# Patient Record
Sex: Female | Born: 1980 | Race: Black or African American | Hispanic: No | Marital: Single | Smoking: Former smoker
Health system: Southern US, Community
[De-identification: ages and names within clinical notes are randomized; demographics above are authoritative.]

## PROBLEM LIST (undated history)

## (undated) DIAGNOSIS — R002 Palpitations: Secondary | ICD-10-CM

## (undated) DIAGNOSIS — F410 Panic disorder [episodic paroxysmal anxiety] without agoraphobia: Secondary | ICD-10-CM

## (undated) DIAGNOSIS — F32A Depression, unspecified: Secondary | ICD-10-CM

## (undated) DIAGNOSIS — T753XXA Motion sickness, initial encounter: Secondary | ICD-10-CM

## (undated) DIAGNOSIS — G43909 Migraine, unspecified, not intractable, without status migrainosus: Secondary | ICD-10-CM

## (undated) DIAGNOSIS — T7840XA Allergy, unspecified, initial encounter: Secondary | ICD-10-CM

## (undated) DIAGNOSIS — G2581 Restless legs syndrome: Secondary | ICD-10-CM

## (undated) DIAGNOSIS — F329 Major depressive disorder, single episode, unspecified: Secondary | ICD-10-CM

## (undated) DIAGNOSIS — M069 Rheumatoid arthritis, unspecified: Secondary | ICD-10-CM

## (undated) DIAGNOSIS — M549 Dorsalgia, unspecified: Secondary | ICD-10-CM

## (undated) HISTORY — DX: Depression, unspecified: F32.A

## (undated) HISTORY — DX: Restless legs syndrome: G25.81

## (undated) HISTORY — PX: TUBAL LIGATION: SHX77

## (undated) HISTORY — DX: Panic disorder (episodic paroxysmal anxiety): F41.0

## (undated) HISTORY — DX: Palpitations: R00.2

## (undated) HISTORY — DX: Major depressive disorder, single episode, unspecified: F32.9

## (undated) HISTORY — PX: ABDOMINAL HYSTERECTOMY: SHX81

## (undated) HISTORY — DX: Allergy, unspecified, initial encounter: T78.40XA

## (undated) HISTORY — DX: Dorsalgia, unspecified: M54.9

## (undated) HISTORY — DX: Migraine, unspecified, not intractable, without status migrainosus: G43.909

---

## 2005-02-05 ENCOUNTER — Ambulatory Visit: Payer: Self-pay

## 2006-06-16 ENCOUNTER — Inpatient Hospital Stay: Payer: Self-pay

## 2006-06-16 ENCOUNTER — Observation Stay: Payer: Self-pay | Admitting: Unknown Physician Specialty

## 2007-06-26 ENCOUNTER — Emergency Department: Payer: Self-pay | Admitting: Emergency Medicine

## 2008-06-07 ENCOUNTER — Emergency Department: Payer: Self-pay

## 2008-08-23 ENCOUNTER — Ambulatory Visit: Payer: Self-pay | Admitting: Family Medicine

## 2008-12-19 ENCOUNTER — Ambulatory Visit: Payer: Self-pay | Admitting: Family Medicine

## 2009-11-08 ENCOUNTER — Emergency Department: Payer: Self-pay | Admitting: Emergency Medicine

## 2010-03-13 ENCOUNTER — Ambulatory Visit: Payer: Self-pay | Admitting: Family Medicine

## 2010-11-01 LAB — HM PAP SMEAR: HM Pap smear: NORMAL

## 2010-12-19 ENCOUNTER — Emergency Department: Payer: Self-pay | Admitting: Emergency Medicine

## 2012-09-16 ENCOUNTER — Ambulatory Visit: Payer: Self-pay | Admitting: Family Medicine

## 2013-08-20 ENCOUNTER — Emergency Department: Payer: Self-pay | Admitting: Emergency Medicine

## 2013-08-20 LAB — CBC
HCT: 40.4 % (ref 35.0–47.0)
HGB: 13.5 g/dL (ref 12.0–16.0)
MCH: 31.6 pg (ref 26.0–34.0)
MCHC: 33.4 g/dL (ref 32.0–36.0)
MCV: 95 fL (ref 80–100)
Platelet: 237 10*3/uL (ref 150–440)
RBC: 4.28 10*6/uL (ref 3.80–5.20)
RDW: 13.5 % (ref 11.5–14.5)
WBC: 11.8 10*3/uL — ABNORMAL HIGH (ref 3.6–11.0)

## 2013-08-20 LAB — URINALYSIS, COMPLETE
Bacteria: NONE SEEN
Bilirubin,UR: NEGATIVE
Glucose,UR: NEGATIVE mg/dL (ref 0–75)
Ketone: NEGATIVE
Nitrite: NEGATIVE
Ph: 6 (ref 4.5–8.0)
Protein: 30
RBC,UR: 1909 /HPF (ref 0–5)
Specific Gravity: 1.005 (ref 1.003–1.030)
Squamous Epithelial: NONE SEEN
WBC UR: 149 /HPF (ref 0–5)

## 2013-08-20 LAB — BASIC METABOLIC PANEL
Anion Gap: 4 — ABNORMAL LOW (ref 7–16)
BUN: 9 mg/dL (ref 7–18)
Calcium, Total: 9.1 mg/dL (ref 8.5–10.1)
Chloride: 108 mmol/L — ABNORMAL HIGH (ref 98–107)
Co2: 28 mmol/L (ref 21–32)
Creatinine: 0.92 mg/dL (ref 0.60–1.30)
EGFR (African American): >60
EGFR (Non-African Amer.): >60
Glucose: 86 mg/dL (ref 65–99)
Osmolality: 277 (ref 275–301)
Potassium: 3.5 mmol/L (ref 3.5–5.1)
Sodium: 140 mmol/L (ref 136–145)

## 2013-09-17 ENCOUNTER — Ambulatory Visit: Payer: Self-pay | Admitting: Orthopedic Surgery

## 2013-10-12 ENCOUNTER — Ambulatory Visit: Payer: Self-pay | Admitting: Family Medicine

## 2015-02-24 ENCOUNTER — Other Ambulatory Visit: Payer: Self-pay

## 2015-02-24 ENCOUNTER — Emergency Department: Payer: Medicaid Other

## 2015-02-24 ENCOUNTER — Encounter: Payer: Self-pay | Admitting: *Deleted

## 2015-02-24 ENCOUNTER — Emergency Department
Admission: EM | Admit: 2015-02-24 | Discharge: 2015-02-24 | Disposition: A | Discharge status: Home / Self Care | Payer: Medicaid Other | Attending: Emergency Medicine | Admitting: Emergency Medicine

## 2015-02-24 DIAGNOSIS — R0789 Other chest pain: Secondary | ICD-10-CM

## 2015-02-24 DIAGNOSIS — R079 Chest pain, unspecified: Secondary | ICD-10-CM | POA: Diagnosis not present

## 2015-02-24 DIAGNOSIS — Z720 Tobacco use: Secondary | ICD-10-CM | POA: Diagnosis not present

## 2015-02-24 DIAGNOSIS — N644 Mastodynia: Secondary | ICD-10-CM | POA: Insufficient documentation

## 2015-02-24 LAB — BASIC METABOLIC PANEL
Anion gap: 6 (ref 5–15)
BUN: 11 mg/dL (ref 6–20)
CO2: 25 mmol/L (ref 22–32)
Calcium: 9 mg/dL (ref 8.9–10.3)
Chloride: 108 mmol/L (ref 101–111)
Creatinine, Ser: 0.75 mg/dL (ref 0.44–1.00)
GFR calc Af Amer: >60 mL/min (ref >60)
GFR calc non Af Amer: >60 mL/min (ref >60)
Glucose, Bld: 100 mg/dL — ABNORMAL HIGH (ref 65–99)
Potassium: 3.8 mmol/L (ref 3.5–5.1)
Sodium: 139 mmol/L (ref 135–145)

## 2015-02-24 LAB — CBC
HCT: 42.5 % (ref 35.0–47.0)
Hemoglobin: 13.8 g/dL (ref 12.0–16.0)
MCH: 30.7 pg (ref 26.0–34.0)
MCHC: 32.4 g/dL (ref 32.0–36.0)
MCV: 94.5 fL (ref 80.0–100.0)
Platelets: 234 10*3/uL (ref 150–440)
RBC: 4.5 MIL/uL (ref 3.80–5.20)
RDW: 13.5 % (ref 11.5–14.5)
WBC: 8 10*3/uL (ref 3.6–11.0)

## 2015-02-24 LAB — TROPONIN I: Troponin I: <0.03 ng/mL (ref <0.031)

## 2015-02-24 NOTE — ED Provider Notes (Signed)
[facility] Emergency Department Provider Note  ____________________________________________  Time seen: Approximately 9:30 AM  I have reviewed the triage vital signs and the nursing notes.   HISTORY  Chief Complaint Chest Pain    HPI Virginia Walls is a 34 y.o. female who presents with several weeks of left-sided chest pain. She reports it is very tender to the touch. She states that it woke her up last night and that she was sweating from the pain. It is reproducible with palpation of her left breast. She denies fever/chills, shortness of breath, nausea, vomiting, abdominal pain. She has had these symptoms for weeks but has not yet had the opportunity to see her primary care doctor. She uses tobacco but has no other significant past medical history. The pain was severe last night but is mild at this time.   No past medical history on file.  There are no active problems to display for this patient.   No past surgical history on file.  No current outpatient prescriptions on file.  Allergies Review of patient's allergies indicates no known allergies.  No family history on file.  Social History History  Substance Use Topics  . Smoking status: Current Every Day Smoker -- 0.50 packs/day    Types: Cigarettes  . Smokeless tobacco: Not on file  . Alcohol Use: Yes    Review of Systems Constitutional: Negative for fever. Eyes: Negative for visual changes. ENT: Negative for sore throat. Cardiovascular: Chest pain that is reproducible with palpation of her left breast. Respiratory: Negative for shortness of breath. Gastrointestinal: Negative for abdominal pain, vomiting and diarrhea. Genitourinary: Negative for dysuria. Musculoskeletal: Negative for back pain. Skin: Negative for rash. Neurological: Negative for headaches, focal weakness or numbness.  10-point ROS otherwise negative.  ____________________________________________   PHYSICAL  EXAM:  VITAL SIGNS: ED Triage Vitals  Enc Vitals Group     BP 02/24/15 0810 114/69 mmHg     Pulse Rate 02/24/15 0810 77     Resp --      Temp 02/24/15 0810 98.4 F (36.9 C)     Temp Source 02/24/15 0810 Oral     SpO2 02/24/15 0810 100 %     Weight 02/24/15 0810 165 lb (74.844 kg)     Height 02/24/15 0810 5\' 3"  (1.6 m)     Head Cir --      Peak Flow --      Pain Score 02/24/15 0811 2     Pain Loc --      Pain Edu? --      Excl. in GC? --     Constitutional: Alert and oriented. Well appearing and in no distress. Eyes: Conjunctivae are normal. PERRL. Normal extraocular movements. Head: Normocephalic and atraumatic. Nose: No congestion/rhinnorhea. Mouth/Throat: Mucous membranes are moist. Neck: No stridor.  No cervical spine tenderness to palpation. Hematological/Lymphatic/Immunilogical: No cervical lymphadenopathy. Cardiovascular: Normal rate, regular rhythm. Normal and symmetric distal pulses are present in all extremities. No murmurs, rubs, or gallops. Highly reproducible left-sided breast tenderness.  There are no signs of infection/cellulitis and no palpable masses or skin lesions. A chaperone was present for the breast exam. Respiratory: Normal respiratory effort without tachypnea nor retractions. Breath sounds are clear and equal bilaterally. No wheezes/rales/rhonchi. Gastrointestinal: Soft and nontender. No distention. No abdominal bruits. There is no CVA tenderness. Musculoskeletal: No lower extremity tenderness nor edema.  Non-tender with normal range of motion in all extremities. No joint effusions. Neurologic:  Normal speech and language. No gross focal  neurologic deficits are appreciated. Speech is normal. No gait instability. Skin:  Skin is warm, dry and intact. No rash noted. Psychiatric: Mood and affect are normal. Speech and behavior are normal. Patient exhibits appropriate insight and judgment.  ____________________________________________   LABS (all labs  ordered are listed, but only abnormal results are displayed)  Labs Reviewed  BASIC METABOLIC PANEL - Abnormal; Notable for the following:    Glucose, Bld 100 (*)    All other components within normal limits  CBC  TROPONIN I   ____________________________________________  EKG   Date: 02/24/2015  Rate: 72  Rhythm: normal sinus rhythm  QRS Axis: normal  Intervals: normal  ST/T Wave abnormalities: normal  Conduction Disutrbances: none  Narrative Interpretation: unremarkable   ____________________________________________  RADIOLOGY  Dg Chest 2 View  02/24/2015   CLINICAL DATA:  One week history of chest pain. Cardiac palpitations for 1 day  EXAM: CHEST  2 VIEW  COMPARISON:  None.  FINDINGS: Lungs are clear. Heart size and pulmonary vascularity are normal. No adenopathy. No pneumothorax. No bone lesions.  IMPRESSION: No edema or consolidation.   Electronically Signed   By: Bretta Bang III M.D.   On: 02/24/2015 08:42    ____________________________________________   PROCEDURES  Procedure(s) performed: None  Critical Care performed: No  ____________________________________________   INITIAL IMPRESSION / ASSESSMENT AND PLAN / ED COURSE  Pertinent labs & imaging results that were available during my care of the patient were reviewed by me and considered in my medical decision making (see chart for details).  The patient is a young, generally healthy woman with highly reviewed reproducible left-sided chest wall tenderness which actually seems to be confined to her left breast.  I am not concerned at this time for ACS nor for a PE (see below).  Vital signs stable/afebrile.  Patient is well-appearing and in no acute distress.  I discussed with her the differential diagnosis and my recommendation to follow-up with her primary care doctor. There is no evidence of a mass, infection, or other emergent breast condition at this time. The patient understands and agrees with the  plan.  HEART score = 1 (smoking and family history)    Pulmonary Embolism Rule-out Criteria (PERC rule)                        If YES to ANY of the following, the PERC rule is not satisfied and cannot be used to rule out PE in this patient (consider d-dimer or imaging depending on pre-test probability).                      If NO to ALL of the following, AND the clinician's pre-test probability is <15%, the [facility] rule is satisfied and there is no need for further workup (including no need to obtain a d-dimer) as the post-test probability of pulmonary embolism is <2%.                      Mnemonic is HAD CLOTS   H - hormone use (exogenous estrogen)      No A - age > 50                                                 No D - DVT/PE history  No   C - coughing blood (hemoptysis)                 No L - leg swelling, unilateral                             No O - O2 Sat on Room Air < 95%                  No T - tachycardia (HR ? 100)                         No S - surgery or trauma, recent                      No   Based on my evaluation of the patient, including application of this decision instrument, further testing to evaluate for pulmonary embolism is not indicated at this time. I have discussed this recommendation with the patient who states understanding and agreement with this plan.  ____________________________________________   FINAL CLINICAL IMPRESSION(S) / ED DIAGNOSES  Final diagnoses:  Breast pain, left  Atypical chest pain     Loleta Rose, MD 02/24/15 1058

## 2015-02-24 NOTE — ED Notes (Signed)
Patient transported to X-ray, ambulatory. No acute distress noted.

## 2015-02-24 NOTE — ED Notes (Signed)
Pt informed to return if any life threatening symptoms occur.  

## 2015-02-24 NOTE — ED Notes (Signed)
Pt reports having chest pain intermittent for the last week, pt reports chest hurts when lying in it, pt reports nausea

## 2015-02-24 NOTE — Discharge Instructions (Signed)
You have been seen in the Emergency Department (ED) today for chest pain.  As we have discussed todays test results are normal, and we believe your pain is due to pain/strain and/or inflammation of the muscles and/or cartilage of your chest wall, though the pain actually seems to be confined to her left breast.  We do not see any signs of inflammation or infection of the breast at this time. We recommend you take ibuprofen 600 mg three times a day with meals for the next 5 days (unless you have been told previously not to take ibuprofen or NSAIDs in general).  You may also take Tylenol according to the label instructions.  Read through the included information for additional treatment recommendations and precautions.  Continue to take your regular medications.   Return to the Emergency Department (ED) if you experience any further chest pain/pressure/tightness, difficulty breathing, or sudden sweating, or other symptoms that concern you.   Breast Tenderness Breast tenderness is a common problem for women of all ages. Breast tenderness may cause mild discomfort to severe pain. It has a variety of causes. Your health care provider will find out the likely cause of your breast tenderness by examining your breasts, asking you about symptoms, and ordering some tests. Breast tenderness usually does not mean you have breast cancer. HOME CARE INSTRUCTIONS  Breast tenderness often can be handled at home. You can try:  Getting fitted for a new bra that provides more support, especially during exercise.  Wearing a more supportive bra or sports bra while sleeping when your breasts are very tender.  If you have a breast injury, apply ice to the area:  Put ice in a plastic bag.  Place a towel between your skin and the bag.  Leave the ice on for 20 minutes, 2-3 times a day.  If your breasts are too full of milk as a result of breastfeeding, try:  Expressing milk either by hand or with a breast  pump.  Applying a warm compress to the breasts for relief.  Taking over-the-counter pain relievers, if approved by your health care provider.  Taking other medicines that your health care provider prescribes. These may include antibiotic medicines or birth control pills. Over the long term, your breast tenderness might be eased if you:  Cut down on caffeine.  Reduce the amount of fat in your diet. Keep a log of the days and times when your breasts are most tender. This will help you and your health care provider find the cause of the tenderness and how to relieve it. Also, learn how to do breast exams at home. This will help you notice if you have an unusual growth or lump that could cause tenderness. SEEK MEDICAL CARE IF:   Any part of your breast is hard, red, and hot to the touch. This could be a sign of infection.  Fluid is coming out of your nipples (and you are not breastfeeding). Especially watch for blood or pus.  You have a fever as well as breast tenderness.  You have a new or painful lump in your breast that remains after your menstrual period ends.  You have tried to take care of the pain at home, but it has not gone away.  Your breast pain is getting worse, or the pain is making it hard to do the things you usually do during your day. Document Released: 09/19/2008 Document Revised: 06/09/2013 Document Reviewed: 05/06/2013 [facility] Patient Information 2015 [place], [place]. This information is  not intended to replace advice given to you by your health care provider. Make sure you discuss any questions you have with your health care provider.  Chest Pain (Nonspecific) It is often hard to give a specific diagnosis for the cause of chest pain. There is always a chance that your pain could be related to something serious, such as a heart attack or a blood clot in the lungs. You need to follow up with your health care provider for further evaluation. CAUSES    Heartburn.  Pneumonia or bronchitis.  Anxiety or stress.  Inflammation around your heart (pericarditis) or lung (pleuritis or pleurisy).  A blood clot in the lung.  A collapsed lung (pneumothorax). It can develop suddenly on its own (spontaneous pneumothorax) or from trauma to the chest.  Shingles infection (herpes zoster virus). The chest wall is composed of bones, muscles, and cartilage. Any of these can be the source of the pain.  The bones can be bruised by injury.  The muscles or cartilage can be strained by coughing or overwork.  The cartilage can be affected by inflammation and become sore (costochondritis). DIAGNOSIS  Lab tests or other studies may be needed to find the cause of your pain. Your health care provider may have you take a test called an ambulatory electrocardiogram (ECG). An ECG records your heartbeat patterns over a 24-hour period. You may also have other tests, such as:  Transthoracic echocardiogram (TTE). During echocardiography, sound waves are used to evaluate how blood flows through your heart.  Transesophageal echocardiogram (TEE).  Cardiac monitoring. This allows your health care provider to monitor your heart rate and rhythm in real time.  Holter monitor. This is a portable device that records your heartbeat and can help diagnose heart arrhythmias. It allows your health care provider to track your heart activity for several days, if needed.  Stress tests by exercise or by giving medicine that makes the heart beat faster. TREATMENT   Treatment depends on what may be causing your chest pain. Treatment may include:  Acid blockers for heartburn.  Anti-inflammatory medicine.  Pain medicine for inflammatory conditions.  Antibiotics if an infection is present.  You may be advised to change lifestyle habits. This includes stopping smoking and avoiding alcohol, caffeine, and chocolate.  You may be advised to keep your head raised (elevated) when  sleeping. This reduces the chance of acid going backward from your stomach into your esophagus. Most of the time, nonspecific chest pain will improve within 2-3 days with rest and mild pain medicine.  HOME CARE INSTRUCTIONS   If antibiotics were prescribed, take them as directed. Finish them even if you start to feel better.  For the next few days, avoid physical activities that bring on chest pain. Continue physical activities as directed.  Do not use any tobacco products, including cigarettes, chewing tobacco, or electronic cigarettes.  Avoid drinking alcohol.  Only take medicine as directed by your health care provider.  Follow your health care provider's suggestions for further testing if your chest pain does not go away.  Keep any follow-up appointments you made. If you do not go to an appointment, you could develop lasting (chronic) problems with pain. If there is any problem keeping an appointment, call to reschedule. SEEK MEDICAL CARE IF:   Your chest pain does not go away, even after treatment.  You have a rash with blisters on your chest.  You have a fever. SEEK IMMEDIATE MEDICAL CARE IF:   You have increased chest  pain or pain that spreads to your arm, neck, jaw, back, or abdomen.  You have shortness of breath.  You have an increasing cough, or you cough up blood.  You have severe back or abdominal pain.  You feel nauseous or vomit.  You have severe weakness.  You faint.  You have chills. This is an emergency. Do not wait to see if the pain will go away. Get medical help at once. Call your local emergency services (911 in [place] Do not drive yourself to the hospital. MAKE SURE YOU:   Understand these instructions.  Will watch your condition.  Will get help right away if you are not doing well or get worse. Document Released: 07/17/2005 Document Revised: 10/12/2013 Document Reviewed: 05/12/2008 [facility] Patient Information 2015 [place], [place]. This  information is not intended to replace advice given to you by your health care provider. Make sure you discuss any questions you have with your health care provider.

## 2015-02-24 NOTE — ED Notes (Signed)
MD at bedside. Dr. York Cerise

## 2015-02-24 NOTE — ED Notes (Signed)
Duplicate order of EKG discontinued.

## 2015-04-20 ENCOUNTER — Encounter: Payer: Self-pay | Admitting: Emergency Medicine

## 2015-04-20 ENCOUNTER — Emergency Department
Admission: EM | Admit: 2015-04-20 | Discharge: 2015-04-20 | Disposition: A | Discharge status: Home / Self Care | Payer: Medicaid Other | Attending: Emergency Medicine | Admitting: Emergency Medicine

## 2015-04-20 DIAGNOSIS — M545 Low back pain, unspecified: Secondary | ICD-10-CM

## 2015-04-20 DIAGNOSIS — Z3202 Encounter for pregnancy test, result negative: Secondary | ICD-10-CM | POA: Insufficient documentation

## 2015-04-20 DIAGNOSIS — Z791 Long term (current) use of non-steroidal anti-inflammatories (NSAID): Secondary | ICD-10-CM | POA: Insufficient documentation

## 2015-04-20 DIAGNOSIS — R109 Unspecified abdominal pain: Secondary | ICD-10-CM | POA: Diagnosis present

## 2015-04-20 DIAGNOSIS — Z720 Tobacco use: Secondary | ICD-10-CM | POA: Insufficient documentation

## 2015-04-20 LAB — COMPREHENSIVE METABOLIC PANEL
ALT: 14 U/L (ref 14–54)
AST: 18 U/L (ref 15–41)
Albumin: 4.1 g/dL (ref 3.5–5.0)
Alkaline Phosphatase: 47 U/L (ref 38–126)
Anion gap: 8 (ref 5–15)
BUN: 9 mg/dL (ref 6–20)
CO2: 27 mmol/L (ref 22–32)
Calcium: 9.3 mg/dL (ref 8.9–10.3)
Chloride: 103 mmol/L (ref 101–111)
Creatinine, Ser: 0.72 mg/dL (ref 0.44–1.00)
GFR calc Af Amer: >60 mL/min (ref >60)
GFR calc non Af Amer: >60 mL/min (ref >60)
Glucose, Bld: 96 mg/dL (ref 65–99)
Potassium: 4.4 mmol/L (ref 3.5–5.1)
Sodium: 138 mmol/L (ref 135–145)
Total Bilirubin: 0.4 mg/dL (ref 0.3–1.2)
Total Protein: 8.3 g/dL — ABNORMAL HIGH (ref 6.5–8.1)

## 2015-04-20 LAB — URINALYSIS COMPLETE WITH MICROSCOPIC (ARMC ONLY)
Bacteria, UA: NONE SEEN
Bilirubin Urine: NEGATIVE
Glucose, UA: NEGATIVE mg/dL
Hgb urine dipstick: NEGATIVE
Ketones, ur: NEGATIVE mg/dL
Leukocytes, UA: NEGATIVE
Nitrite: NEGATIVE
Protein, ur: NEGATIVE mg/dL
Specific Gravity, Urine: 1.024 (ref 1.005–1.030)
pH: 5 (ref 5.0–8.0)

## 2015-04-20 LAB — LIPASE, BLOOD: Lipase: 43 U/L (ref 22–51)

## 2015-04-20 LAB — CBC
HCT: 40.5 % (ref 35.0–47.0)
Hemoglobin: 13.2 g/dL (ref 12.0–16.0)
MCH: 30.8 pg (ref 26.0–34.0)
MCHC: 32.6 g/dL (ref 32.0–36.0)
MCV: 94.4 fL (ref 80.0–100.0)
Platelets: 220 10*3/uL (ref 150–440)
RBC: 4.29 MIL/uL (ref 3.80–5.20)
RDW: 13.3 % (ref 11.5–14.5)
WBC: 8.1 10*3/uL (ref 3.6–11.0)

## 2015-04-20 LAB — POCT PREGNANCY, URINE: Preg Test, Ur: NEGATIVE

## 2015-04-20 MED ORDER — KETOROLAC TROMETHAMINE 30 MG/ML IJ SOLN
30.0000 mg | Freq: Once | INTRAMUSCULAR | Status: AC
  Administered 2015-04-20: 30 mg via INTRAVENOUS

## 2015-04-20 MED ORDER — SODIUM CHLORIDE 0.9 % IV SOLN
1000.0000 mL | Freq: Once | INTRAVENOUS | Status: AC
  Administered 2015-04-20: 1000 mL via INTRAVENOUS

## 2015-04-20 MED ORDER — NAPROXEN 500 MG PO TABS: 500.0000 mg | ORAL_TABLET | Freq: Two times a day (BID) | ORAL | Status: DC

## 2015-04-20 MED ORDER — KETOROLAC TROMETHAMINE 30 MG/ML IJ SOLN
INTRAMUSCULAR | Status: AC
  Administered 2015-04-20: 30 mg via INTRAVENOUS
  Filled 2015-04-20: qty 1

## 2015-04-20 NOTE — ED Provider Notes (Signed)
[facility] Emergency Department Provider Note  ____________________________________________  Time seen: On arrival  I have reviewed the triage vital signs and the nursing notes.   HISTORY  Chief Complaint Flank Pain   HPI Virginia Walls is a 34 y.o. female who presents with complaints of right flank painthat she has had for approximately 4 days. She reports the pain is constant, moderate and aching in nature and worse with movement. She has no history of kidney stones. She denies dysuria. She denies hematuria. She has no fevers no chills. She has no history of this in the past. No injury noted.     History reviewed. No pertinent past medical history.  There are no active problems to display for this patient.   Past Surgical History  Procedure Laterality Date  . Abdominal hysterectomy      Current Outpatient Rx  Name  Route  Sig  Dispense  Refill  . meloxicam (MOBIC) 15 MG tablet   Oral   Take 1 tablet by mouth daily.      0     Allergies Review of patient's allergies indicates no known allergies.  History reviewed. No pertinent family history.  Social History History  Substance Use Topics  . Smoking status: Current Every Day Smoker -- 0.50 packs/day    Types: Cigarettes  . Smokeless tobacco: Not on file  . Alcohol Use: Yes    Review of Systems  Constitutional: Negative for fever. Eyes: Negative for visual changes. ENT: Negative for sore throat Cardiovascular: Negative for chest pain. Respiratory: Negative for shortness of breath. Gastrointestinal: Negative for vomiting and diarrhea. Genitourinary: Negative for dysuria. Musculoskeletal: Positive for back pain on the right Skin: Negative for rash. Neurological: Negative for headaches or focal weakness Psychiatric: No anxiety  10-point ROS otherwise negative.  ____________________________________________   PHYSICAL EXAM:  VITAL SIGNS: ED Triage Vitals  Enc Vitals Group      BP 04/20/15 0826 113/66 mmHg     Pulse Rate 04/20/15 0826 97     Resp --      Temp 04/20/15 0826 98.1 F (36.7 C)     Temp Source 04/20/15 0826 Oral     SpO2 04/20/15 0826 100 %     Weight 04/20/15 0826 165 lb (74.844 kg)     Height 04/20/15 0826 5\' 4"  (1.626 m)     Head Cir --      Peak Flow --      Pain Score 04/20/15 0826 10     Pain Loc --      Pain Edu? --      Excl. in GC? --      Constitutional: Alert and oriented. Well appearing and in no distress. Eyes: Conjunctivae are normal.  ENT   Head: Normocephalic and atraumatic.   Mouth/Throat: Mucous membranes are moist. Cardiovascular: Normal rate, regular rhythm. Normal and symmetric distal pulses are present in all extremities. No murmurs, rubs, or gallops. Respiratory: Normal respiratory effort without tachypnea nor retractions. Breath sounds are clear and equal bilaterally.  Gastrointestinal: Soft and non-tender in all quadrants. No distention. There is no CVA tenderness. Genitourinary: deferred Musculoskeletal: Nontender with normal range of motion in all extremities. No lower extremity tenderness nor edema. Neurologic:  Normal speech and language. No gross focal neurologic deficits are appreciated. Skin:  Skin is warm, dry and intact. No rash noted. Psychiatric: Mood and affect are normal. Patient exhibits appropriate insight and judgment.  ____________________________________________    LABS (pertinent positives/negatives)  Labs Reviewed  COMPREHENSIVE METABOLIC PANEL - Abnormal; Notable for the following:    Total Protein 8.3 (*)    All other components within normal limits  URINALYSIS COMPLETEWITH MICROSCOPIC ([facility] ONLY) - Abnormal; Notable for the following:    Color, Urine YELLOW (*)    APPearance CLEAR (*)    Squamous Epithelial / LPF 0-5 (*)    All other components within normal limits  CBC  LIPASE, BLOOD  POCT PREGNANCY, URINE     ____________________________________________   EKG  None  ____________________________________________    RADIOLOGY I have personally reviewed any xrays that were ordered on this patient:  None ordered  ____________________________________________   PROCEDURES  Procedure(s) performed: none  Critical Care performed: none  ____________________________________________   INITIAL IMPRESSION / ASSESSMENT AND PLAN / ED COURSE  Pertinent labs & imaging results that were available during my care of the patient were reviewed by me and considered in my medical decision making (see chart for details).  Overall patient well-appearing, differential diagnosis includes kidney stone, pyonephritis, urinary tract infection, muscular skeletal injury-strain.  ----------------------------------------- 10:53 AM on 04/20/2015 -----------------------------------------  Urinalysis negative for infection also negative for blood. Labs otherwise benign. Given that patient has symptoms when she moves I feel this is likely musculoskeletal and she agrees. We will treat this with NSAIDs and have patient follow-up with her primary care provider. She knows to return if she developed any fevers chills nausea vomiting or any other concerning symptoms  ____________________________________________   FINAL CLINICAL IMPRESSION(S) / ED DIAGNOSES  Final diagnoses:  Right-sided low back pain without sciatica     Jene Every, MD 04/20/15 1056

## 2015-04-20 NOTE — Discharge Instructions (Signed)
Back Pain, Adult °Back pain is very common. The pain often gets better over time. The cause of back pain is usually not dangerous. Most people can learn to manage their back pain on their own.  °HOME CARE  °· Stay active. Start with short walks on flat ground if you can. Try to walk farther each day. °· Do not sit, drive, or stand in one place for more than 30 minutes. Do not stay in bed. °· Do not avoid exercise or work. Activity can help your back heal faster. °· Be careful when you bend or lift an object. Bend at your knees, keep the object close to you, and do not twist. °· Sleep on a firm mattress. Lie on your side, and bend your knees. If you lie on your back, put a pillow under your knees. °· Only take medicines as told by your doctor. °· Put ice on the injured area. °¨ Put ice in a plastic bag. °¨ Place a towel between your skin and the bag. °¨ Leave the ice on for 15-20 minutes, 03-04 times a day for the first 2 to 3 days. After that, you can switch between ice and heat packs. °· Ask your doctor about back exercises or massage. °· Avoid feeling anxious or stressed. Find good ways to deal with stress, such as exercise. °GET HELP RIGHT AWAY IF:  °· Your pain does not go away with rest or medicine. °· Your pain does not go away in 1 week. °· You have new problems. °· You do not feel well. °· The pain spreads into your legs. °· You cannot control when you poop (bowel movement) or pee (urinate). °· Your arms or legs feel weak or lose feeling (numbness). °· You feel sick to your stomach (nauseous) or throw up (vomit). °· You have belly (abdominal) pain. °· You feel like you may pass out (faint). °MAKE SURE YOU:  °· Understand these instructions. °· Will watch your condition. °· Will get help right away if you are not doing well or get worse. °Document Released: 03/25/2008 Document Revised: 12/30/2011 Document Reviewed: 02/08/2014 °[employer]® Patient Information ©2015 [employer]. This information is not intended  to replace advice given to you by your health care provider. Make sure you discuss any questions you have with your health care provider. ° °

## 2015-04-20 NOTE — ED Notes (Signed)
Pt reports that she developed right sided flank pain for the last few days. States it is getting worse. Denies any blood in urine or painful urination. Denies any know injury. States that she can not get comfortable.

## 2015-04-29 ENCOUNTER — Encounter: Payer: Self-pay | Admitting: Family Medicine

## 2015-04-29 DIAGNOSIS — G8929 Other chronic pain: Secondary | ICD-10-CM | POA: Insufficient documentation

## 2015-04-29 DIAGNOSIS — F32A Depression, unspecified: Secondary | ICD-10-CM | POA: Insufficient documentation

## 2015-04-29 DIAGNOSIS — G43909 Migraine, unspecified, not intractable, without status migrainosus: Secondary | ICD-10-CM | POA: Insufficient documentation

## 2015-04-29 DIAGNOSIS — R0683 Snoring: Secondary | ICD-10-CM | POA: Insufficient documentation

## 2015-04-29 DIAGNOSIS — E559 Vitamin D deficiency, unspecified: Secondary | ICD-10-CM | POA: Insufficient documentation

## 2015-04-29 DIAGNOSIS — F329 Major depressive disorder, single episode, unspecified: Secondary | ICD-10-CM | POA: Insufficient documentation

## 2015-04-29 DIAGNOSIS — F410 Panic disorder [episodic paroxysmal anxiety] without agoraphobia: Secondary | ICD-10-CM | POA: Insufficient documentation

## 2015-04-29 DIAGNOSIS — G2581 Restless legs syndrome: Secondary | ICD-10-CM | POA: Insufficient documentation

## 2015-04-29 DIAGNOSIS — R42 Dizziness and giddiness: Secondary | ICD-10-CM | POA: Insufficient documentation

## 2015-04-29 DIAGNOSIS — M549 Dorsalgia, unspecified: Secondary | ICD-10-CM

## 2015-04-29 DIAGNOSIS — E663 Overweight: Secondary | ICD-10-CM | POA: Insufficient documentation

## 2015-04-29 DIAGNOSIS — F17200 Nicotine dependence, unspecified, uncomplicated: Secondary | ICD-10-CM | POA: Insufficient documentation

## 2015-04-29 DIAGNOSIS — R102 Pelvic and perineal pain: Secondary | ICD-10-CM | POA: Insufficient documentation

## 2015-05-02 ENCOUNTER — Encounter: Payer: Self-pay | Admitting: Family Medicine

## 2015-05-04 ENCOUNTER — Ambulatory Visit (INDEPENDENT_AMBULATORY_CARE_PROVIDER_SITE_OTHER): Payer: Medicaid Other | Admitting: Family Medicine

## 2015-05-04 ENCOUNTER — Encounter: Payer: Self-pay | Admitting: Family Medicine

## 2015-05-04 VITALS — BP 118/64 | HR 104 | Temp 98.4°F | Resp 18 | Ht 64.0 in | Wt 166.4 lb

## 2015-05-04 DIAGNOSIS — Z0000 Encounter for general adult medical examination without abnormal findings: Secondary | ICD-10-CM

## 2015-05-04 DIAGNOSIS — Z7189 Other specified counseling: Secondary | ICD-10-CM

## 2015-05-04 DIAGNOSIS — L818 Other specified disorders of pigmentation: Secondary | ICD-10-CM | POA: Diagnosis not present

## 2015-05-04 DIAGNOSIS — R739 Hyperglycemia, unspecified: Secondary | ICD-10-CM | POA: Diagnosis not present

## 2015-05-04 DIAGNOSIS — E559 Vitamin D deficiency, unspecified: Secondary | ICD-10-CM

## 2015-05-04 DIAGNOSIS — Z716 Tobacco abuse counseling: Secondary | ICD-10-CM

## 2015-05-04 DIAGNOSIS — M545 Low back pain, unspecified: Secondary | ICD-10-CM

## 2015-05-04 DIAGNOSIS — Z01419 Encounter for gynecological examination (general) (routine) without abnormal findings: Secondary | ICD-10-CM

## 2015-05-04 DIAGNOSIS — Z13220 Encounter for screening for lipoid disorders: Secondary | ICD-10-CM | POA: Diagnosis not present

## 2015-05-04 DIAGNOSIS — Z719 Counseling, unspecified: Secondary | ICD-10-CM

## 2015-05-04 MED ORDER — CYCLOBENZAPRINE HCL 10 MG PO TABS: 10.0000 mg | ORAL_TABLET | Freq: Three times a day (TID) | ORAL | Status: DC | PRN

## 2015-05-04 NOTE — Progress Notes (Signed)
Name: Virginia Walls   MRN: [id number]    DOB: July 29, 1981   Date:05/04/2015       Progress Note  Subjective  Chief Complaint  Chief Complaint  Patient presents with  . Annual Exam    HPI  Well Woman Exam: she is feeling well except for low back pain, it was severe two weeks ago and had to go to [facility], was given Naproxen and the pain has improved, but not gone. She denies radiculitis.   She is status post-hysterectomy for vaginal bleeding in 2012 no longer needs hysterectomy  She goes to the dentist every 6 months.   She is in a monogamous relationship past 14 years and does not want to be checked for STI  Patient Active Problem List   Diagnosis Date Noted  . Hyperglycemia 05/04/2015  . Back pain, chronic 04/29/2015  . Clinical depression 04/29/2015  . Headache, migraine 04/29/2015  . Excess weight 04/29/2015  . Panic disorder 04/29/2015  . Pelvic pain in female 04/29/2015  . Restless leg 04/29/2015  . Snores 04/29/2015  . Compulsive tobacco user syndrome 04/29/2015  . Vitamin D deficiency 04/29/2015  . Vertigo 04/29/2015    Past Surgical History  Procedure Laterality Date  . Abdominal hysterectomy    . Tubal ligation      Family History  Problem Relation Age of Onset  . Heart disease Mother   . Asthma Mother   . Hypothyroidism Mother   . Hypothyroidism Sister   . Diabetes Sister   . Hyperlipidemia Sister   . Asthma Son   . Heart disease Paternal Grandmother     Pacemaker  . Leukemia Paternal Grandmother   . Diabetes Paternal Grandmother     History   Social History  . Marital Status: Single    Spouse Name: N/A  . Number of Children: N/A  . Years of Education: N/A   Occupational History  . Not on file.   Social History Main Topics  . Smoking status: Current Some Day Smoker -- 0.25 packs/day for 4 years    Types: Cigarettes    Start date: 05/04/2011  . Smokeless tobacco: Never Used  . Alcohol Use: 0.0 oz/week    0 Standard drinks or equivalent per  week     Comment: occasionally  . Drug Use: No  . Sexual Activity:    Partners: Male    Birth Control/ Protection:    Other Topics Concern  . Not on file   Social History Narrative     Current outpatient prescriptions:  .  cyclobenzaprine (FLEXERIL) 10 MG tablet, Take 1 tablet (10 mg total) by mouth 3 (three) times daily as needed for muscle spasms., Disp: 30 tablet, Rfl: 0 .  naproxen (NAPROSYN) 500 MG tablet, Take 1 tablet (500 mg total) by mouth 2 (two) times daily with a meal., Disp: 20 tablet, Rfl: 2  No Known Allergies   ROS  Constitutional: Negative for fever or weight change.  Respiratory: Negative for cough and shortness of breath.   Cardiovascular: Negative for chest pain or palpitations.  Gastrointestinal: Negative for abdominal pain, no bowel changes.  Musculoskeletal: Negative for gait problem or joint swelling. Positive for back pain Skin: Negative for rash.  Neurological: Negative for dizziness or headache.  No other specific complaints in a complete review of systems (except as listed in HPI above).  Objective  Filed Vitals:   05/04/15 0838  BP: 118/64  Pulse: 104  Temp: 98.4 F (36.9 C)  TempSrc: Oral  Resp: 18  Height: '5\' 4"'  (1.626 m)  Weight: 166 lb 6.4 oz (75.479 kg)  SpO2: 98%    Body mass index is 28.55 kg/(m^2).  Physical Exam  Constitutional: Patient appears well-developed and well-nourished. No distress.  HENT: Head: Normocephalic and atraumatic. Ears: B TMs ok, no erythema or effusion; Nose: Nose normal. Mouth/Throat: Oropharynx is clear and moist. No oropharyngeal exudate.  Eyes: Conjunctivae and EOM are normal. Pupils are equal, round, and reactive to light. No scleral icterus.  Neck: Normal range of motion. Neck supple. No JVD present. No thyromegaly present.  Cardiovascular: Normal rate, regular rhythm and normal heart sounds.  No murmur heard. No BLE edema. Pulmonary/Chest: Effort normal and breath sounds normal. No respiratory  distress. Abdominal: Soft. Bowel sounds are normal, no distension. There is no tenderness. no masses Breast: no lumps or masses, no nipple discharge or rashes FEMALE GENITALIA:  External genitalia normal External urethra normal Vaginal vault normal without discharge or lesions RECTAL: not done Musculoskeletal: Normal range of motion, no joint effusions. No gross deformities. Pain during palpation of left lower back, neg straight leg raise Neurological: he is alert and oriented to person, place, and time. No cranial nerve deficit. Coordination, balance, strength, speech and gait are normal.  Skin: Skin is warm and dry. No rash noted. No erythema. Tattoos  Psychiatric: Patient has a normal mood and affect. behavior is normal. Judgment and thought content normal.  Recent Results (from the past 2160 hour(s))  CBC     Status: None   Collection Time: 02/24/15  8:18 AM  Result Value Ref Range   WBC 8.0 3.6 - 11.0 K/uL   RBC 4.50 3.80 - 5.20 MIL/uL   Hemoglobin 13.8 12.0 - 16.0 g/dL   HCT 42.5 35.0 - 47.0 %   MCV 94.5 80.0 - 100.0 fL   MCH 30.7 26.0 - 34.0 pg   MCHC 32.4 32.0 - 36.0 g/dL   RDW 13.5 11.5 - 14.5 %   Platelets 234 150 - 440 K/uL  Basic metabolic panel     Status: Abnormal   Collection Time: 02/24/15  8:18 AM  Result Value Ref Range   Sodium 139 135 - 145 mmol/L   Potassium 3.8 3.5 - 5.1 mmol/L   Chloride 108 101 - 111 mmol/L   CO2 25 22 - 32 mmol/L   Glucose, Bld 100 (H) 65 - 99 mg/dL   BUN 11 6 - 20 mg/dL   Creatinine, Ser 0.75 0.44 - 1.00 mg/dL   Calcium 9.0 8.9 - 10.3 mg/dL   GFR calc non Af Amer >60 >60 mL/min   GFR calc Af Amer >60 >60 mL/min    Comment: (NOTE) The eGFR has been calculated using the CKD EPI equation. This calculation has not been validated in all clinical situations. eGFR's persistently <60 mL/min signify possible Chronic Kidney Disease.    Anion gap 6 5 - 15  Troponin I     Status: None   Collection Time: 02/24/15  8:18 AM  Result Value Ref  Range   Troponin I <0.03 <0.031 ng/mL    Comment:        NO INDICATION OF MYOCARDIAL INJURY.   CBC     Status: None   Collection Time: 04/20/15  8:47 AM  Result Value Ref Range   WBC 8.1 3.6 - 11.0 K/uL   RBC 4.29 3.80 - 5.20 MIL/uL   Hemoglobin 13.2 12.0 - 16.0 g/dL   HCT 40.5 35.0 - 47.0 %   MCV  94.4 80.0 - 100.0 fL   MCH 30.8 26.0 - 34.0 pg   MCHC 32.6 32.0 - 36.0 g/dL   RDW 13.3 11.5 - 14.5 %   Platelets 220 150 - 440 K/uL  Comprehensive metabolic panel     Status: Abnormal   Collection Time: 04/20/15  8:47 AM  Result Value Ref Range   Sodium 138 135 - 145 mmol/L   Potassium 4.4 3.5 - 5.1 mmol/L   Chloride 103 101 - 111 mmol/L   CO2 27 22 - 32 mmol/L   Glucose, Bld 96 65 - 99 mg/dL   BUN 9 6 - 20 mg/dL   Creatinine, Ser 0.72 0.44 - 1.00 mg/dL   Calcium 9.3 8.9 - 10.3 mg/dL   Total Protein 8.3 (H) 6.5 - 8.1 g/dL   Albumin 4.1 3.5 - 5.0 g/dL   AST 18 15 - 41 U/L   ALT 14 14 - 54 U/L   Alkaline Phosphatase 47 38 - 126 U/L   Total Bilirubin 0.4 0.3 - 1.2 mg/dL   GFR calc non Af Amer >60 >60 mL/min   GFR calc Af Amer >60 >60 mL/min    Comment: (NOTE) The eGFR has been calculated using the CKD EPI equation. This calculation has not been validated in all clinical situations. eGFR's persistently <60 mL/min signify possible Chronic Kidney Disease.    Anion gap 8 5 - 15  Lipase, blood     Status: None   Collection Time: 04/20/15  8:47 AM  Result Value Ref Range   Lipase 43 22 - 51 U/L  Urinalysis complete, with microscopic ([facility] only)     Status: Abnormal   Collection Time: 04/20/15  8:49 AM  Result Value Ref Range   Color, Urine YELLOW (A) YELLOW   APPearance CLEAR (A) CLEAR   Glucose, UA NEGATIVE NEGATIVE mg/dL   Bilirubin Urine NEGATIVE NEGATIVE   Ketones, ur NEGATIVE NEGATIVE mg/dL   Specific Gravity, Urine 1.024 1.005 - 1.030   Hgb urine dipstick NEGATIVE NEGATIVE   pH 5.0 5.0 - 8.0   Protein, ur NEGATIVE NEGATIVE mg/dL   Nitrite NEGATIVE NEGATIVE    Leukocytes, UA NEGATIVE NEGATIVE   RBC / HPF 0-5 0 - 5 RBC/hpf   WBC, UA 0-5 0 - 5 WBC/hpf   Bacteria, UA NONE SEEN NONE SEEN   Squamous Epithelial / LPF 0-5 (A) NONE SEEN   Mucous PRESENT   Pregnancy, urine POC     Status: None   Collection Time: 04/20/15  9:25 AM  Result Value Ref Range   Preg Test, Ur NEGATIVE NEGATIVE    Comment:        THE SENSITIVITY OF THIS METHODOLOGY IS >24 mIU/mL      PHQ2/9: Depression screen PHQ 2/9 05/04/2015  Decreased Interest 1  Down, Depressed, Hopeless 1  PHQ - 2 Score 2  Altered sleeping 2  Tired, decreased energy 1  Change in appetite 2  Feeling bad or failure about yourself  1  Trouble concentrating 1  Moving slowly or fidgety/restless 0  Suicidal thoughts 0  PHQ-9 Score 9  Difficult doing work/chores Not difficult at all   She states she is grieving at this time, does not like medication for depression  Fall Risk: Fall Risk  05/04/2015  Falls in the past year? No      Assessment & Plan  1. Well woman exam  2. Midline low back pain without sciatica  - cyclobenzaprine (FLEXERIL) 10 MG tablet; Take 1 tablet (10 mg  total) by mouth 3 (three) times daily as needed for muscle spasms.  Dispense: 30 tablet; Refill: 0  3. Vitamin D deficiency  - Vitamin D (25 hydroxy)  4. Hyperglycemia  - HgB A1c  5. Encounter for tobacco use cessation counseling  She is down to one cigarette daily , encouraged her to keep up the good work  6. Health counseling  Discussed importance of 150 minutes of physical activity weekly, eat two servings of fish weekly, eat one serving of tree nuts ( cashews, pistachios, pecans, almonds.Marland Kitchen) every other day, eat 6 servings of fruit/vegetables daily and drink plenty of water and avoid sweet beverages.   7. Lipid screening  - Lipid Profile

## 2015-05-04 NOTE — Patient Instructions (Signed)
Discussed importance of 150 minutes of physical activity weekly, eat two servings of fish weekly, eat one serving of tree nuts ( cashews, pistachios, pecans, almonds..) every other day, eat 6 servings of fruit/vegetables daily and drink plenty of water and avoid sweet beverages. 

## 2015-05-05 ENCOUNTER — Other Ambulatory Visit: Payer: Self-pay | Admitting: Family Medicine

## 2015-05-05 LAB — HEMOGLOBIN A1C
Est. average glucose Bld gHb Est-mCnc: 117 mg/dL
Hgb A1c MFr Bld: 5.7 % — ABNORMAL HIGH (ref 4.8–5.6)

## 2015-05-05 LAB — LIPID PANEL
Chol/HDL Ratio: 3.9 ratio units (ref 0.0–4.4)
Cholesterol, Total: 220 mg/dL — ABNORMAL HIGH (ref 100–199)
HDL: 56 mg/dL (ref >39)
LDL Calculated: 138 mg/dL — ABNORMAL HIGH (ref 0–99)
Triglycerides: 132 mg/dL (ref 0–149)
VLDL Cholesterol Cal: 26 mg/dL (ref 5–40)

## 2015-05-05 LAB — VITAMIN D 25 HYDROXY (VIT D DEFICIENCY, FRACTURES): Vit D, 25-Hydroxy: 16.1 ng/mL — ABNORMAL LOW (ref 30.0–100.0)

## 2015-05-05 MED ORDER — VITAMIN D (ERGOCALCIFEROL) 1.25 MG (50000 UNIT) PO CAPS: 50000.0000 [IU] | ORAL_CAPSULE | ORAL | Status: DC

## 2015-05-05 NOTE — Progress Notes (Signed)
Patient notified

## 2015-10-20 ENCOUNTER — Encounter: Payer: Self-pay | Admitting: Family Medicine

## 2015-10-20 ENCOUNTER — Ambulatory Visit (INDEPENDENT_AMBULATORY_CARE_PROVIDER_SITE_OTHER): Payer: Medicaid Other | Admitting: Family Medicine

## 2015-10-20 VITALS — BP 118/71 | HR 82 | Temp 98.3°F | Resp 17 | Ht 64.0 in | Wt 171.7 lb

## 2015-10-20 DIAGNOSIS — J320 Chronic maxillary sinusitis: Secondary | ICD-10-CM | POA: Diagnosis not present

## 2015-10-20 MED ORDER — AMOXICILLIN-POT CLAVULANATE 875-125 MG PO TABS
1.0000 | ORAL_TABLET | Freq: Two times a day (BID) | ORAL | Status: DC
Start: 2015-10-20 — End: 2015-12-15

## 2015-10-20 MED ORDER — PREDNISONE 10 MG (21) PO TBPK: ORAL_TABLET | ORAL | Status: DC

## 2015-10-20 NOTE — Progress Notes (Signed)
Name: Virginia Walls   MRN: [id number]    DOB: [birth year]   Date:10/20/2015       Progress Note  Subjective  Chief Complaint  Chief Complaint  Patient presents with  . Sinus Problem    Nose bleeds, bloody sputum production  . Headache    Sinus    HPI  Patient is here today with concerns regarding the following symptoms congestion, post nasal drip, ear pressure, sinus pressure and low grade fevers that started weeks ago.  Associated with fatigue, nose bleed if blows her nose hard, spitting up blood too. Not associated with rash, nausea. Has tried the following home remedies: none.    Past Medical History  Diagnosis Date  . RLS (restless legs syndrome)   . Back pain   . Palpitation   . Panic disorder   . Depression   . Migraine     Social History  Substance Use Topics  . Smoking status: Current Some Day Smoker -- 0.25 packs/day for 4 years    Types: Cigarettes    Start date: 05/04/2011  . Smokeless tobacco: Never Used  . Alcohol Use: 0.0 oz/week    0 Standard drinks or equivalent per week     Comment: occasionally     Current outpatient prescriptions:  Marland Kitchen  Vitamin D, Ergocalciferol, (DRISDOL) 50000 UNITS CAPS capsule, Take 1 capsule (50,000 Units total) by mouth every 7 (seven) days., Disp: 12 capsule, Rfl: 0 .  cyclobenzaprine (FLEXERIL) 10 MG tablet, Take 1 tablet (10 mg total) by mouth 3 (three) times daily as needed for muscle spasms. (Patient not taking: Reported on 10/20/2015), Disp: 30 tablet, Rfl: 0 .  naproxen (NAPROSYN) 500 MG tablet, Take 1 tablet (500 mg total) by mouth 2 (two) times daily with a meal. (Patient not taking: Reported on 10/20/2015), Disp: 20 tablet, Rfl: 2  No Known Allergies  ROS  Positive for fatigue, nasal congestion, sinus pressure, ear fullness as mentioned in HPI, otherwise all systems reviewed and are negative.  Objective  Filed Vitals:   10/20/15 0955  BP: 118/71  Pulse: 82  Temp: 98.3 F (36.8 C)  TempSrc: Oral  Resp: 17   Height: 5\' 4"  (1.626 m)  Weight: 171 lb 11.2 oz (77.883 kg)  SpO2: 99%   Body mass index is 29.46 kg/(m^2).   Physical Exam  Constitutional: Patient appears well-developed and well-nourished. In no acute distress but does appear to be fatigued from acute illness. HEENT:  - Head: Normocephalic and atraumatic. Left maxillary sinus tenderness. - Ears: RIGHT TM bulging with minimal clear exudate, LEFT TM bulging with minimal clear exudate.  - Nose: Nasal mucosa boggy and congested. Left nasal turbinates swollen, dried blood.  - Mouth/Throat: Oropharynx is moist with slight erythema of bilateral tonsils without hypertrophy or exudates. Post nasal drainage present.  - Eyes: Conjunctivae clear, EOM movements normal. PERRLA. No scleral icterus.  Neck: Normal range of motion. Neck supple. No JVD present. No thyromegaly present. No local lymphadenopathy. Cardiovascular: Regular rate, regular rhythm with no murmurs heard.  Pulmonary/Chest: Effort normal and breath sounds clear in all lung fields.  Musculoskeletal: Normal range of motion bilateral UE and LE, no joint effusions. Skin: Skin is warm and dry. No rash noted. Psychiatric: Patient has a normal mood and affect. Behavior is normal in office today. Judgment and thought content normal in office today.   Assessment & Plan  1. Left maxillary sinusitis Etiologies include initial allergic rhinitis or viral infection progressing to superimposed bacterial infection. Instructed patient  on increasing hydration, nasal saline spray, steam inhalation, NSAID if tolerated and not contraindicated. Avoid harsh blowing of nose to prevent further nose bleeds, use Vaseline in nares. Nasal saline spray and wash okay. Start abx and prednisone.  - amoxicillin-clavulanate (AUGMENTIN) 875-125 MG tablet; Take 1 tablet by mouth 2 (two) times daily.  Dispense: 20 tablet; Refill: 0 - predniSONE (STERAPRED UNI-PAK 21 TAB) 10 MG (21) TBPK tablet; Use as directed in a 6  day oral Pred Pak  Dispense: 21 tablet; Refill: 0

## 2015-10-20 NOTE — Patient Instructions (Signed)

## 2015-10-31 ENCOUNTER — Telehealth: Payer: Self-pay | Admitting: Family Medicine

## 2015-10-31 ENCOUNTER — Other Ambulatory Visit: Payer: Self-pay | Admitting: Family Medicine

## 2015-10-31 MED ORDER — FLUCONAZOLE 150 MG PO TABS: 150.0000 mg | ORAL_TABLET | ORAL | Status: DC

## 2015-10-31 NOTE — Telephone Encounter (Signed)
PT SAID THAT YOU ALL TOLD HER TO CALL IF SHE DEVELOPED A YEAST INFECTION FOR THE MEDICATION. SHE NEES SOMETHING CALLED IN TO [facility] ON [address]

## 2015-10-31 NOTE — Telephone Encounter (Signed)
Refill request was sent to Dr. Krichna Sowles for approval and submission.  

## 2015-11-10 NOTE — Telephone Encounter (Signed)
Can not close incounter. Think it is complete

## 2015-12-15 ENCOUNTER — Encounter: Payer: Self-pay | Admitting: Family Medicine

## 2015-12-15 ENCOUNTER — Ambulatory Visit (INDEPENDENT_AMBULATORY_CARE_PROVIDER_SITE_OTHER): Payer: Medicaid Other | Admitting: Family Medicine

## 2015-12-15 VITALS — BP 116/78 | HR 76 | Temp 98.6°F | Resp 16 | Ht 64.0 in | Wt 173.9 lb

## 2015-12-15 DIAGNOSIS — M79671 Pain in right foot: Secondary | ICD-10-CM

## 2015-12-15 DIAGNOSIS — Z23 Encounter for immunization: Secondary | ICD-10-CM | POA: Diagnosis not present

## 2015-12-15 MED ORDER — MELOXICAM 15 MG PO TABS: 15.0000 mg | ORAL_TABLET | Freq: Every day | ORAL | Status: DC

## 2015-12-15 NOTE — Progress Notes (Signed)
Name: Virginia Walls   MRN: [id number]    DOB: [birth year]   Date:12/15/2015       Progress Note  Subjective  Chief Complaint  Chief Complaint  Patient presents with  . Foot Pain    patient presents with right foot pain for about a couple of weeks. no known injury. some swelling, shooting pains, no bruising.  patient stated that it really hurts during weight bearing.    HPI  Right dorsal foot pain: she states pain started 3 weeks ago , initially the size of a quarter on right medial foot. Getting progressively worse and now it hurts constantly , the entire medial column of right foot. Causing pain with pressure, bearing weight with dorsiflexion or plantar flexion, no redness, no increase warmth, pain is with pressure and movement. Sh eis not on high risk medication. Only taking Flexeril but is not helping. She does not have a history of gout   Patient Active Problem List   Diagnosis Date Noted  . Hyperglycemia 05/04/2015  . Tattoos 05/04/2015  . Back pain, chronic 04/29/2015  . Clinical depression 04/29/2015  . Headache, migraine 04/29/2015  . Excess weight 04/29/2015  . Panic disorder 04/29/2015  . Pelvic pain in female 04/29/2015  . Restless leg 04/29/2015  . Snores 04/29/2015  . Compulsive tobacco user syndrome 04/29/2015  . Vitamin D deficiency 04/29/2015  . Vertigo 04/29/2015    Past Surgical History  Procedure Laterality Date  . Abdominal hysterectomy    . Tubal ligation      Family History  Problem Relation Age of Onset  . Heart disease Mother   . Asthma Mother   . Hypothyroidism Mother   . Hypothyroidism Sister   . Diabetes Sister   . Hyperlipidemia Sister   . Asthma Son   . Heart disease Paternal Grandmother     Pacemaker  . Leukemia Paternal Grandmother   . Diabetes Paternal Grandmother     Social History   Social History  . Marital Status: Single    Spouse Name: N/A  . Number of Children: N/A  . Years of Education: N/A   Occupational History  .  Not on file.   Social History Main Topics  . Smoking status: Current Some Day Smoker -- 0.25 packs/day for 4 years    Types: Cigarettes    Start date: 05/04/2011  . Smokeless tobacco: Never Used  . Alcohol Use: 0.0 oz/week    0 Standard drinks or equivalent per week     Comment: occasionally  . Drug Use: No  . Sexual Activity:    Partners: Male    Birth Control/ Protection:    Other Topics Concern  . Not on file   Social History Narrative     Current outpatient prescriptions:  .  cyclobenzaprine (FLEXERIL) 10 MG tablet, Take 1 tablet (10 mg total) by mouth 3 (three) times daily as needed for muscle spasms. (Patient not taking: Reported on 10/20/2015), Disp: 30 tablet, Rfl: 0 .  meloxicam (MOBIC) 15 MG tablet, Take 1 tablet (15 mg total) by mouth daily., Disp: 30 tablet, Rfl: 0 .  Vitamin D, Ergocalciferol, (DRISDOL) 50000 UNITS CAPS capsule, Take 1 capsule (50,000 Units total) by mouth every 7 (seven) days., Disp: 12 capsule, Rfl: 0  No Known Allergies   ROS  Ten systems reviewed and is negative except as mentioned in HPI   Objective  Filed Vitals:   12/15/15 1135  BP: 116/78  Pulse: 76  Temp: 98.6 F (37 C)  TempSrc: Oral  Resp: 16  Height: 5\' 4"  (1.626 m)  Weight: 173 lb 14.4 oz (78.881 kg)  SpO2: 98%    Body mass index is 29.84 kg/(m^2).  Physical Exam  Constitutional: Patient appears well-developed and well-nourished. No distress.  HEENT: head atraumatic, normocephalic, pupils equal and reactive to light,  neck supple, throat within normal limits Cardiovascular: Normal rate, regular rhythm and normal heart sounds.  No murmur heard. No BLE edema. Pulmonary/Chest: Effort normal and breath sounds normal. No respiratory distress. Abdominal: Soft.  There is no tenderness. Psychiatric: Patient has a normal mood and affect. behavior is normal. Judgment and thought content normal. Muscular Skeletal: pain with movement of foot and weight bearing, also with  pressure during exam of right medial column of foot. No redness, increase in warmth.   PHQ2/9: Depression screen [facility] 2/9 12/15/2015 10/20/2015 05/04/2015  Decreased Interest 0 0 1  Down, Depressed, Hopeless 0 0 1  PHQ - 2 Score 0 0 2  Altered sleeping - - 2  Tired, decreased energy - - 1  Change in appetite - - 2  Feeling bad or failure about yourself  - - 1  Trouble concentrating - - 1  Moving slowly or fidgety/restless - - 0  Suicidal thoughts - - 0  PHQ-9 Score - - 9  Difficult doing work/chores - - Not difficult at all     Fall Risk: Fall Risk  12/15/2015 10/20/2015 05/04/2015  Falls in the past year? No No No     Functional Status Survey: Is the patient deaf or have difficulty hearing?: No Does the patient have difficulty seeing, even when wearing glasses/contacts?: No Does the patient have difficulty concentrating, remembering, or making decisions?: No Does the patient have difficulty walking or climbing stairs?: No Does the patient have difficulty dressing or bathing?: No Does the patient have difficulty doing errands alone such as visiting a doctor's office or shopping?: No   Assessment & Plan  1. Foot pain, right  Likely a tendinitis/sprain. I will start her on NSAID's, symptoms are intense and we will also refer her to a podiatrist. Avoid weight bearing - meloxicam (MOBIC) 15 MG tablet; Take 1 tablet (15 mg total) by mouth daily.  Dispense: 30 tablet; Refill: 0 - Ambulatory referral to Podiatry  2. Needs flu shot  - Flu Vaccine QUAD 36+ mos IM

## 2016-01-30 ENCOUNTER — Ambulatory Visit (INDEPENDENT_AMBULATORY_CARE_PROVIDER_SITE_OTHER): Payer: Medicaid Other

## 2016-01-30 ENCOUNTER — Encounter: Payer: Self-pay | Admitting: Podiatry

## 2016-01-30 ENCOUNTER — Ambulatory Visit (INDEPENDENT_AMBULATORY_CARE_PROVIDER_SITE_OTHER): Payer: Medicaid Other | Admitting: Podiatry

## 2016-01-30 VITALS — BP 137/101 | HR 86 | Resp 18

## 2016-01-30 DIAGNOSIS — R52 Pain, unspecified: Secondary | ICD-10-CM | POA: Diagnosis not present

## 2016-01-30 DIAGNOSIS — M7750 Other enthesopathy of unspecified foot: Secondary | ICD-10-CM | POA: Diagnosis not present

## 2016-01-30 DIAGNOSIS — M76819 Anterior tibial syndrome, unspecified leg: Secondary | ICD-10-CM

## 2016-01-30 DIAGNOSIS — M779 Enthesopathy, unspecified: Secondary | ICD-10-CM | POA: Diagnosis not present

## 2016-01-30 NOTE — Progress Notes (Signed)
   Subjective:    Patient ID: Virginia Walls, female    DOB: 1981/04/26, 35 y.o.   MRN: [id number]  HPI  35 year old female presents the also concerns of pain in the right foot which is been ongoing for approximately 2 months. She did get her primary care physician and she was prescribed meloxicam which seem to help. She states that she gets swelling to the top of her foot as well as discomfort and makes it difficult to wear regular shoes. She denies any recent injury or trauma. She states that since the pain started in the top of foot is starting to affect her heels off the outside part of her foot as well. She denies any numbness or tingling. No redness the swelling. No other complaints at this time.   Review of Systems  All other systems reviewed and are negative.      Objective:   Physical Exam General: AAO x3, NAD  Dermatological: Skin is warm, dry and supple bilateral. Nails x 10 are well manicured; remaining integument appears unremarkable at this time. There are no open sores, no preulcerative lesions, no rash or signs of infection present.  Vascular: Dorsalis Pedis artery and Posterior Tibial artery pedal pulses are 2/4 bilateral with immedate capillary fill time. Pedal hair growth present. No varicosities and no lower extremity edema present bilateral. There is no pain with calf compression, swelling, warmth, erythema.   Neruologic: Grossly intact via light touch bilateral. Vibratory intact via tuning fork bilateral. Protective threshold with Semmes Wienstein monofilament intact to all pedal sites bilateral. Patellar and Achilles deep tendon reflexes 2+ bilateral. No Babinski or clonus noted bilateral.   Musculoskeletal: There is tenderness along the dorsal medial aspect of the left foot on the dorsal cuneiform on the insertion the tibialis anterior. The tendon appears to be intact otherwise there is no pain on the ankle on the tendon. After this pain started she is or to have some  pain in the outside part of her ankle along the course the peroneal tendon posterior to the lateral malleolus and just inferior. The peroneal tendon appears intact. There is mild discomfort on the medial band of the plantar fascia within the arch the foot. There is no tenderness on the insertion to the calcaneus. Range of motion intact.  Gait: Unassisted, Nonantalgic.      Assessment & Plan:  35 year old female right tibialis anterior tendinitis  -Treatment options discussed including all alternatives, risks, and complications -X-rays were obtained and reviewed with the patient. No evidence of acute fracture or stress fracture.  -I discussed etiology of her symptoms.  -Due to the tenderness along the tibialis anterior recommend immobilization in a cam boot given the longevity as well as a localized intermittent swelling. A prescription for cam boot was provided today. -Continue meloxicam as needed.  -Ice to the area.  -Follow-up in 3-4 weeks or sooner if any problems arise. In the meantime, encouraged to call the office with any questions, concerns, change in symptoms.   Ovid Curd, DPM -

## 2016-02-06 ENCOUNTER — Telehealth: Payer: Self-pay | Admitting: Podiatry

## 2016-02-06 NOTE — Telephone Encounter (Signed)
Patient states she was given an order for [facility] to go to get her orthotics, she has contacted them and they apparently cannot fill the order. SHe called the office at lunch and left a message wants the nurse to call her back in ref to this order. SHe wants to know where else she can go to get them.

## 2016-02-06 NOTE — Telephone Encounter (Signed)
Called and left message for patient to call me back-left the [facility] phone number or can reach me in the [facility] office tomorrow [phone]. Misty Stanley

## 2016-02-27 ENCOUNTER — Ambulatory Visit: Payer: Medicaid Other | Admitting: Podiatry

## 2016-04-04 ENCOUNTER — Ambulatory Visit: Payer: Medicaid Other | Admitting: Podiatry

## 2016-05-07 ENCOUNTER — Ambulatory Visit: Payer: Medicaid Other | Admitting: Podiatry

## 2016-05-07 ENCOUNTER — Encounter: Payer: Self-pay | Admitting: Podiatry

## 2016-05-14 ENCOUNTER — Ambulatory Visit (INDEPENDENT_AMBULATORY_CARE_PROVIDER_SITE_OTHER): Payer: Medicaid Other | Admitting: Podiatry

## 2016-05-14 ENCOUNTER — Encounter: Payer: Self-pay | Admitting: Podiatry

## 2016-05-14 DIAGNOSIS — S93401S Sprain of unspecified ligament of right ankle, sequela: Secondary | ICD-10-CM

## 2016-05-14 DIAGNOSIS — M7750 Other enthesopathy of unspecified foot: Secondary | ICD-10-CM

## 2016-05-14 DIAGNOSIS — M79671 Pain in right foot: Secondary | ICD-10-CM

## 2016-05-14 DIAGNOSIS — M76819 Anterior tibial syndrome, unspecified leg: Secondary | ICD-10-CM

## 2016-05-14 DIAGNOSIS — M779 Enthesopathy, unspecified: Secondary | ICD-10-CM

## 2016-05-14 MED ORDER — MELOXICAM 15 MG PO TABS: 15.0000 mg | ORAL_TABLET | Freq: Every day | ORAL | 0 refills | Status: DC

## 2016-05-14 NOTE — Progress Notes (Signed)
Subjective: 35 year old female presents the office today for follow-up evaluation of right foot pain. She has been in the boot up until today she did go back into a regular shoe. She does that she has made some progress since last appointment she does continue pain and she points to the dorsal medial aspect of the foot. Denies any swelling or redness to the area. Denies any recent injury. Denies any systemic complaints such as fevers, chills, nausea, vomiting. No acute changes since last appointment, and no other complaints at this time.   Objective: AAO x3, NAD DP/PT pulses palpable bilaterally, CRT less than 3 seconds Tenderness palpation along the dorsal medial aspect of the foot approximately level of the insertion of the tibialis anterior tendon. There is also mild discomfort on the course the ATFL on the lateral aspect of ankle. There is no gross ankle instability present. There is no specific area pinpoint bony degenerative pain vibratory sensation. There is no overlying edema, erythema, increase in warmth bilaterally. No open lesions or pre-ulcerative lesions.  No pain with calf compression, swelling, warmth, erythema  Assessment:  35 year old female with right foot tendinitis , likely chronic ankle sprain   Plan: -All treatment options discussed with the patient including all alternatives, risks, complications.  -Discussed steroid injection to the area of maximal tenderness which is along the tibialis anterior tendon. She wishes to proceed under cine risks, complications. Under sterile conditions a mature intact with an phosphate and local anesthetic was infiltrated without couple complications. Post injection care was discussed. Remaining cam boot at all times. No exercising. Ice and elevation. Antibiotic ointment was as needed. Refilled meloxicam. Follow up in 2 weeks or sooner if needed.  -Patient encouraged to call the office with any questions, concerns, change in symptoms.   Ovid Curd, DPM

## 2016-05-28 ENCOUNTER — Ambulatory Visit: Payer: Medicaid Other

## 2016-05-28 ENCOUNTER — Ambulatory Visit (INDEPENDENT_AMBULATORY_CARE_PROVIDER_SITE_OTHER): Payer: Medicaid Other | Admitting: Podiatry

## 2016-05-28 DIAGNOSIS — M76819 Anterior tibial syndrome, unspecified leg: Secondary | ICD-10-CM | POA: Diagnosis not present

## 2016-05-28 DIAGNOSIS — M7750 Other enthesopathy of unspecified foot: Secondary | ICD-10-CM | POA: Diagnosis not present

## 2016-05-28 DIAGNOSIS — M779 Enthesopathy, unspecified: Secondary | ICD-10-CM

## 2016-05-28 DIAGNOSIS — S93401S Sprain of unspecified ligament of right ankle, sequela: Secondary | ICD-10-CM

## 2016-05-28 DIAGNOSIS — M79671 Pain in right foot: Secondary | ICD-10-CM

## 2016-05-29 ENCOUNTER — Telehealth: Payer: Self-pay | Admitting: *Deleted

## 2016-05-29 DIAGNOSIS — T148XXA Other injury of unspecified body region, initial encounter: Secondary | ICD-10-CM

## 2016-05-29 NOTE — Telephone Encounter (Addendum)
-----   Message from Vivi Barrack, DPM sent at 05/28/2016 12:19 PM EDT ----- Can you order an MRI of the left foot and ankle to rule out tendon tear of the peroneal tendon and for the tibialis anterior tendon? Thanks. 06/04/2016-[facility] denied prior authorization of MRI left foot, PHYSICIAN RESPONSE UNIT [phone], Service Order: [id number] (Case#). 06/07/2017-[employer] AUTHORIZATION #[id number] FOR LEFT ANKLE ONLY, VALID 06/29/2016. FAXED TO [facility]. CANCELLED LEFT FOOT MRI. 06/18/2016-Lou Orson Ape states pt is scheduled for MRI on Thursday, but states the MRI is to be of the right foot not the left as ordered and charted. I reviewed pt's chart and Dr. Ardelle Anton ordered MRI of Left ankle for surgery consideration r/o possible tendon tear. Left message requesting pt call to correctly establish which foot is to have MRI. Pt states she has been seeing Dr. Ardelle Anton for the right foot all along and the boot is on the right foot, and the MRI should be on the right. 06/19/2016-[employer] - April M. States [phone] extremity lower any joint was approved use same approval code for right ankle. Faxed to [facility].

## 2016-05-29 NOTE — Progress Notes (Addendum)
Subjective: 35 year old female presents the office today for follow-up evaluation of right foot pain. She said that she remain in the cam boot which she continues to get pain to the left foot. She points the outside aspect of her ankle as well as the top of the foot which is to continue the majority of symptoms. She denies any recent injury or trauma. The foot does swell intermittently. Denies any systemic complaints such as fevers, chills, nausea, vomiting. No acute changes since last appointment, and no other complaints at this time.   Objective: AAO x3, NAD DP/PT pulses palpable bilaterally, CRT less than 3 seconds Tenderness palpation along the dorsal medial aspect of the foot approximately level of the insertion of the tibialis anterior tendon however somewhat improved. There is also mild discomfort on the course the ATFL on the lateral aspect of ankle. There is no gross ankle instability present. There is tenderness along the course of the peroneal tendon just posterior to the lateral malleolus. There is no area pinpoint bony tenderness or pain vibratory sensation. There is a decrease in medial arch height upon weightbearing.  No open lesions or pre-ulcerative lesions.  No pain with calf compression, swelling, warmth, erythema  Assessment: 35 year old female with right foot tendinitis , likely chronic ankle sprain; rule out tear  Plan: -All treatment options discussed with the patient including all alternatives, risks, complications.  -At this time she has had conservative treatment including injections, immobilization, shoe gear changes without any relief of symptoms. At this time requesting MRI of the right foot and ankle continued symptoms. This is for surgical planning potentially pending the outcome of the MRI. This is ordered today.  -Continue cam boot -Ice -Anti-inflammatories -Follow-up after MRI or sooner if any issues are to arise. Call any questions or concerns in the  meantime.  Ovid Curd, DPM

## 2016-06-06 NOTE — Telephone Encounter (Signed)
MRI without contrast of the ANKLE is approved until 06/29/16 Authorization [id number]

## 2016-06-18 NOTE — Telephone Encounter (Signed)
It is the right. The chart notes say right foot, I just put the diagnosis in wrong as the left.

## 2016-06-20 ENCOUNTER — Ambulatory Visit
Admission: RE | Admit: 2016-06-20 | Discharge: 2016-06-20 | Disposition: A | Discharge status: Home / Self Care | Payer: Medicaid Other | Source: Ambulatory Visit | Attending: Podiatry | Admitting: Podiatry

## 2016-06-20 DIAGNOSIS — T148 Other injury of unspecified body region: Secondary | ICD-10-CM | POA: Diagnosis present

## 2016-06-20 DIAGNOSIS — M65871 Other synovitis and tenosynovitis, right ankle and foot: Secondary | ICD-10-CM | POA: Insufficient documentation

## 2016-06-27 ENCOUNTER — Ambulatory Visit: Payer: Medicaid Other | Admitting: Podiatry

## 2016-07-18 ENCOUNTER — Telehealth: Payer: Self-pay | Admitting: *Deleted

## 2016-07-18 ENCOUNTER — Encounter: Payer: Self-pay | Admitting: Podiatry

## 2016-07-18 ENCOUNTER — Ambulatory Visit (INDEPENDENT_AMBULATORY_CARE_PROVIDER_SITE_OTHER): Payer: Medicaid Other | Admitting: Podiatry

## 2016-07-18 DIAGNOSIS — M722 Plantar fascial fibromatosis: Secondary | ICD-10-CM

## 2016-07-18 DIAGNOSIS — M76819 Anterior tibial syndrome, unspecified leg: Secondary | ICD-10-CM

## 2016-07-18 DIAGNOSIS — M779 Enthesopathy, unspecified: Secondary | ICD-10-CM

## 2016-07-18 DIAGNOSIS — M79671 Pain in right foot: Secondary | ICD-10-CM

## 2016-07-18 NOTE — Telephone Encounter (Addendum)
Dr. Ardelle Anton request MRI to be sent for overread. Faxed request for copy of MRI disc. 07/23/2016-Mailed copy MRI disc to [facility].

## 2016-07-18 NOTE — Progress Notes (Signed)
Subjective: 35 year old female presents the office today for follow-up evaluation of right foot and ankle pain. Chest presents to discuss MRI results. She states that she's been in a CAM boot is feeling better but she still any pain she points on the medial band plantar fascia and the arch of the foot as well as the inside out ankle. She is also starting to have some pain in the outside aspect of her ankle as well. She has been icing which helped. No recent injury or trauma. Swelling has improved. Denies any systemic complaints such as fevers, chills, nausea, vomiting. No acute changes since last appointment, and no other complaints at this time.   Objective: AAO x3, NAD DP/PT pulses palpable bilaterally, CRT less than 3 seconds Negative Tinel sign. However subjectively she does get some numbness and sharp pain from the inside of her ankle shooting to the bottom of her foot. There is mild continued tenderness on the anterior aspect of the foot on the tibialis anterior have this appears to be improved. The joint of tenderness today is along the posterior tibial tendon just posterior to medial malleolus and along the medial band plantar fasciitis of the foot. There is also some mild discomfort which continues on the course the ATFL on the lateral aspect of the ankle. There is no gross ankle instability present. There is no area pinpoint bony tenderness or pain the vibratory sensation. There is a decrease in medial arch upon weightbearing. 5. Range of motion intact. No pain with calf compression, swelling, warmth, erythema  Assessment: 35 year old female with right foot tendinitis, plantar fasciitis, lateral ankle pain   Plan: -All treatment options discussed with the patient including all alternatives, risks, complications.  -MRI results were discussed with the patient. Also with [facility] for second opinion. -At this time I do recommend custom molded orthotic given her foot type. I believe that the  pain that she is having to her foot is due to biomechanical changes. She does get some sharp pain on the medial aspect of the ankle into her foot. I don't believe that she has a true tarsal tunnel was slightly from inflammation. However if symptoms continue or nerve conduction test.  -Continue cam boot into she receives her inserts.  -Discussed shoe gear changes as well. She's been wearing an old flat shoe.  -Ice -Anti-inflammatories -Follow up in 3 weeks or sooner if needed. Encouraged to call with any questions or concerns.   Jillyn Ledger, DPM

## 2016-08-15 ENCOUNTER — Ambulatory Visit: Payer: Medicaid Other | Admitting: Podiatry

## 2016-08-29 ENCOUNTER — Ambulatory Visit (INDEPENDENT_AMBULATORY_CARE_PROVIDER_SITE_OTHER): Payer: Medicaid Other | Admitting: Podiatry

## 2016-08-29 DIAGNOSIS — M76819 Anterior tibial syndrome, unspecified leg: Secondary | ICD-10-CM

## 2016-08-29 DIAGNOSIS — M722 Plantar fascial fibromatosis: Secondary | ICD-10-CM

## 2016-08-29 NOTE — Patient Instructions (Signed)

## 2016-08-29 NOTE — Progress Notes (Signed)
Patient presents the pickup orthotics. Break in instructions were discussed. She did not think she is with her to fit the inserts however. Discussed that she can bring them back and we can fit and she desires. She was seen by the CMA. She declined an appointment with me as she was doing well and not having any issues right now. Follow-up in 4 weeks

## 2016-09-26 ENCOUNTER — Ambulatory Visit: Payer: Medicaid Other | Admitting: Podiatry

## 2016-10-09 ENCOUNTER — Ambulatory Visit (INDEPENDENT_AMBULATORY_CARE_PROVIDER_SITE_OTHER): Payer: Self-pay

## 2016-10-09 DIAGNOSIS — Z111 Encounter for screening for respiratory tuberculosis: Secondary | ICD-10-CM

## 2016-10-09 MED ORDER — TUBERCULIN PPD 5 UNIT/0.1ML ID SOLN
5.0000 [IU] | Freq: Once | INTRADERMAL | Status: AC
  Administered 2016-10-09 (×2): 5 [IU] via INTRADERMAL

## 2016-10-11 LAB — TB SKIN TEST
Induration: 0 mm
TB Skin Test: NEGATIVE

## 2016-10-11 NOTE — Progress Notes (Signed)
Results were negative on 10/11/2016

## 2016-10-24 ENCOUNTER — Ambulatory Visit: Payer: Medicaid Other | Admitting: Podiatry

## 2016-11-28 ENCOUNTER — Ambulatory Visit: Payer: Self-pay | Admitting: Podiatry

## 2017-04-16 IMAGING — CR DG CHEST 2V
1 series · 2 of 2 positions shown · non-contrast
Comparison: None.

CLINICAL DATA: One week history of chest pain. Cardiac palpitations
for 1 day

EXAM:
CHEST  2 VIEW

[Series 1: dg chest 2 view · 0.14mm/px · 2 of 2 slices shown]
[im 1/2]
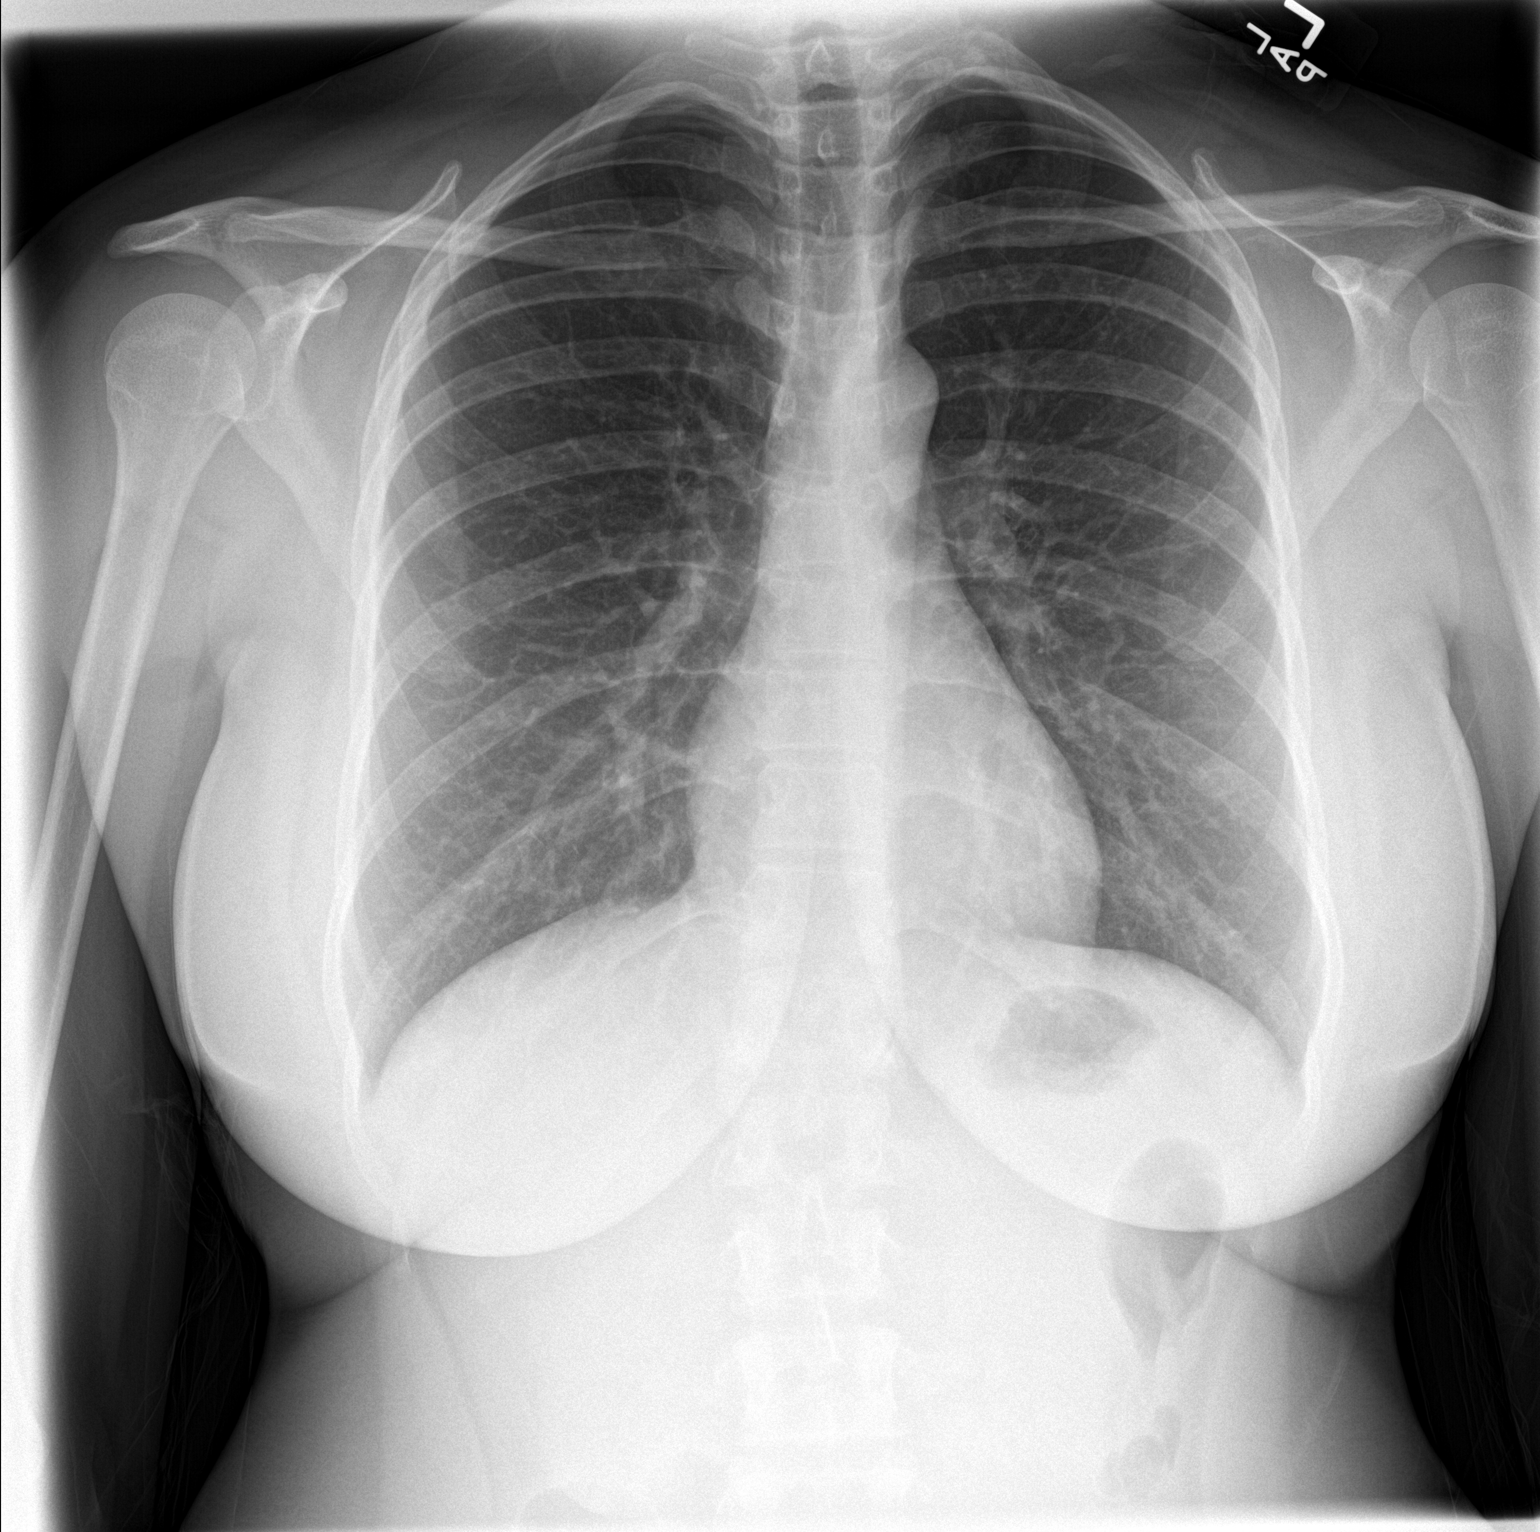
[im 2/2]
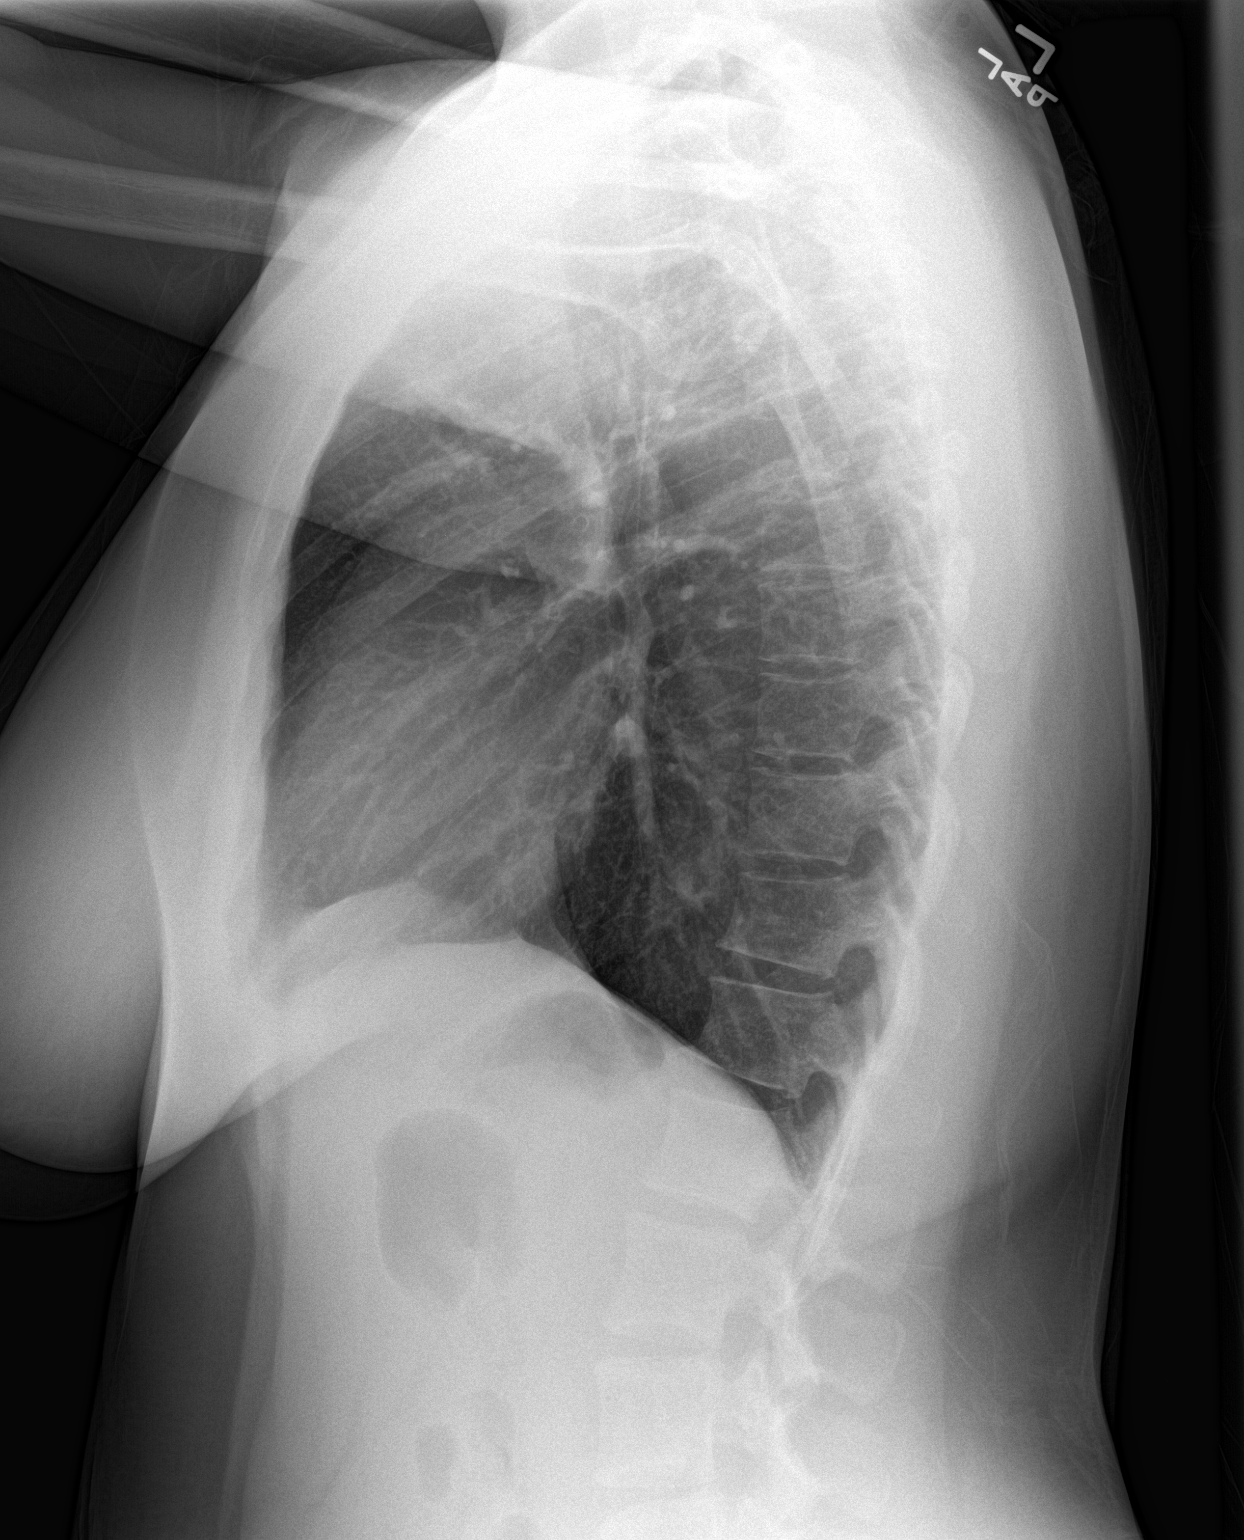

[2 of 2 positions shown; findings below may reference images not displayed]

FINDINGS: Lungs are clear. Heart size and pulmonary vascularity are normal. No
adenopathy. No pneumothorax. No bone lesions.
IMPRESSION: No edema or consolidation.

## 2017-07-24 ENCOUNTER — Encounter: Payer: Self-pay | Admitting: Family Medicine

## 2017-07-24 ENCOUNTER — Ambulatory Visit (INDEPENDENT_AMBULATORY_CARE_PROVIDER_SITE_OTHER): Payer: Medicaid Other | Admitting: Family Medicine

## 2017-07-24 VITALS — BP 100/70 | HR 95 | Wt 158.4 lb

## 2017-07-24 DIAGNOSIS — R079 Chest pain, unspecified: Secondary | ICD-10-CM

## 2017-07-24 DIAGNOSIS — E559 Vitamin D deficiency, unspecified: Secondary | ICD-10-CM | POA: Diagnosis not present

## 2017-07-24 DIAGNOSIS — G43009 Migraine without aura, not intractable, without status migrainosus: Secondary | ICD-10-CM

## 2017-07-24 DIAGNOSIS — R739 Hyperglycemia, unspecified: Secondary | ICD-10-CM

## 2017-07-24 DIAGNOSIS — Z23 Encounter for immunization: Secondary | ICD-10-CM

## 2017-07-24 DIAGNOSIS — N898 Other specified noninflammatory disorders of vagina: Secondary | ICD-10-CM

## 2017-07-24 DIAGNOSIS — Z13220 Encounter for screening for lipoid disorders: Secondary | ICD-10-CM | POA: Diagnosis not present

## 2017-07-24 DIAGNOSIS — R002 Palpitations: Secondary | ICD-10-CM | POA: Diagnosis not present

## 2017-07-24 LAB — WET PREP BY MOLECULAR PROBE
Candida species: NOT DETECTED
MICRO NUMBER:: 81103990
SPECIMEN QUALITY:: ADEQUATE
Trichomonas vaginosis: NOT DETECTED

## 2017-07-24 MED ORDER — METRONIDAZOLE 500 MG PO TABS: 500.0000 mg | ORAL_TABLET | Freq: Two times a day (BID) | ORAL | 0 refills | Status: DC

## 2017-07-24 NOTE — Progress Notes (Signed)
Name: Virginia Walls   MRN: [id number]    DOB: 09-06-1981   Date:07/24/2017       Progress Note  Subjective  Chief Complaint  Chief Complaint  Patient presents with  . Vaginitis    HPI  Vaginitis: she states that she noticed increase in vaginal discharge and odor over the past couple of weeks. She had a hysterectomy and same sexual partner ( past 17 years). No itching or pain with intercourse, no pelvic pain either. Denies dysuria or frequency  Migraine headaches: she states she is doing well, episodes at most once a week, on right temporal area and radiates to nuchal area. Resolves with Excedrin migraine and a 30 minute rest. Sometimes associated photophobia, phonophobia, also occasionally has nausea.   Chest pain: she has a history of anxiety and depression, previous history of panic attacks, states works a lot, recently mother diagnosed with brain tumor. She states episodes of chest pain are brief, about 2 minutes, associated with sob and palpitation. It can happen at rest or during activity. She states not necessary with stress. Symptoms started over the past couple of months, and it happens 2-3 times daily    Patient Active Problem List   Diagnosis Date Noted  . Hyperglycemia 05/04/2015  . Tattoos 05/04/2015  . Headache, migraine 04/29/2015  . Excess weight 04/29/2015  . Restless leg 04/29/2015  . Snores 04/29/2015  . Vitamin D deficiency 04/29/2015    Past Surgical History:  Procedure Laterality Date  . ABDOMINAL HYSTERECTOMY    . TUBAL LIGATION      Family History  Problem Relation Age of Onset  . Heart disease Mother   . Asthma Mother   . Hypothyroidism Mother   . Hypothyroidism Sister   . Diabetes Sister   . Hyperlipidemia Sister   . Asthma Son   . Heart disease Paternal Grandmother        Pacemaker  . Leukemia Paternal Grandmother   . Diabetes Paternal Grandmother     Social History   Social History  . Marital status: Single    Spouse name: N/A  .  Number of children: N/A  . Years of education: N/A   Occupational History  . Not on file.   Social History Main Topics  . Smoking status: Current Some Day Smoker    Packs/day: 0.25    Years: 4.00    Types: Cigarettes    Start date: 05/04/2011  . Smokeless tobacco: Never Used  . Alcohol use 0.0 oz/week     Comment: occasionally  . Drug use: No  . Sexual activity: Yes    Partners: Male    Birth control/ protection:    Other Topics Concern  . Not on file   Social History Narrative  . No narrative on file     Current Outpatient Prescriptions:  .  metroNIDAZOLE (FLAGYL) 500 MG tablet, Take 1 tablet (500 mg total) by mouth 2 (two) times daily., Disp: 14 tablet, Rfl: 0  No Known Allergies   ROS  Constitutional: Negative for fever or weight change.  Respiratory: Negative for cough and shortness of breath.   Cardiovascular: Positive  for intermittent chest pain and palpitations.  Gastrointestinal: Negative for abdominal pain, no bowel changes.  Musculoskeletal: Negative for gait problem or joint swelling.  Skin: Negative for rash.  Neurological: Negative for dizziness, positive for intermittent headache.  No other specific complaints in a complete review of systems (except as listed in HPI above).  Objective  Vitals:  07/24/17 0756  BP: 100/70  Pulse: 95  SpO2: 99%  Weight: 158 lb 6.4 oz (71.8 kg)    Body mass index is 27.19 kg/m.  Physical Exam  Constitutional: Patient appears well-developed and well-nourished. Overweight.  No distress.  HEENT: head atraumatic, normocephalic, pupils equal and reactive to light, neck supple, throat within normal limits Cardiovascular: Normal rate, regular rhythm and normal heart sounds.  No murmur heard. No BLE edema. Pulmonary/Chest: Effort normal and breath sounds normal. No respiratory distress. Abdominal: Soft.  There is no tenderness. Pelvic not done patient collected specimen .  Psychiatric: Patient has a normal mood and  affect. behavior is normal. Judgment and thought content normal.  PHQ2/9: Depression screen [facility] 2/9 12/15/2015 10/20/2015 05/04/2015  Decreased Interest 0 0 1  Down, Depressed, Hopeless 0 0 1  PHQ - 2 Score 0 0 2  Altered sleeping - - 2  Tired, decreased energy - - 1  Change in appetite - - 2  Feeling bad or failure about yourself  - - 1  Trouble concentrating - - 1  Moving slowly or fidgety/restless - - 0  Suicidal thoughts - - 0  PHQ-9 Score - - 9  Difficult doing work/chores - - Not difficult at all     Fall Risk: Fall Risk  07/24/2017 12/15/2015 10/20/2015 05/04/2015  Falls in the past year? No No No No    Assessment & Plan  1. Vaginal discharge  - WET PREP BY MOLECULAR PROBE - metroNIDAZOLE (FLAGYL) 500 MG tablet; Take 1 tablet (500 mg total) by mouth 2 (two) times daily.  Dispense: 14 tablet; Refill: 0  2. Migraine without aura and without status migrainosus, not intractable  Taking prn Excedrin migraine  3. Flu vaccine need  - Flu Vaccine QUAD 6+ mos PF IM (Fluarix Quad PF)  4. Vitamin D deficiency  Discussed importance of resuming otc supplementation   5. Chest pain, unspecified type  - EKG 12-Lead  6. Lipid screening  - Lipid panel  7. Hyperglycemia  - Hemoglobin A1c - Insulin, fasting  8. Palpitation  - CBC with Differential/Platelet - COMPLETE METABOLIC PANEL WITH GFR - TSH - EKG 12-Lead

## 2017-07-25 LAB — LIPID PANEL
Cholesterol: 192 mg/dL (ref <200)
HDL: 50 mg/dL — ABNORMAL LOW (ref >50)
LDL Cholesterol (Calc): 125 mg/dL (calc) — ABNORMAL HIGH
Non-HDL Cholesterol (Calc): 142 mg/dL (calc) — ABNORMAL HIGH (ref <130)
Total CHOL/HDL Ratio: 3.8 (calc) (ref <5.0)
Triglycerides: 78 mg/dL (ref <150)

## 2017-07-25 LAB — TSH: TSH: 1.05 mIU/L

## 2017-07-25 LAB — COMPLETE METABOLIC PANEL WITH GFR
AG Ratio: 1.2 (calc) (ref 1.0–2.5)
ALT: 7 U/L (ref 6–29)
AST: 9 U/L — ABNORMAL LOW (ref 10–30)
Albumin: 3.8 g/dL (ref 3.6–5.1)
Alkaline phosphatase (APISO): 46 U/L (ref 33–115)
BUN: 9 mg/dL (ref 7–25)
CO2: 29 mmol/L (ref 20–32)
Calcium: 9.1 mg/dL (ref 8.6–10.2)
Chloride: 106 mmol/L (ref 98–110)
Creat: 0.74 mg/dL (ref 0.50–1.10)
GFR, Est African American: 121 mL/min/{1.73_m2} (ref >=60)
GFR, Est Non African American: 104 mL/min/{1.73_m2} (ref >=60)
Globulin: 3.2 g/dL (calc) (ref 1.9–3.7)
Glucose, Bld: 85 mg/dL (ref 65–99)
Potassium: 4.1 mmol/L (ref 3.5–5.3)
Sodium: 141 mmol/L (ref 135–146)
Total Bilirubin: 0.3 mg/dL (ref 0.2–1.2)
Total Protein: 7 g/dL (ref 6.1–8.1)

## 2017-07-25 LAB — CBC WITH DIFFERENTIAL/PLATELET
Basophils Absolute: 39 cells/uL (ref 0–200)
Basophils Relative: 0.6 %
Eosinophils Absolute: 137 cells/uL (ref 15–500)
Eosinophils Relative: 2.1 %
HCT: 39 % (ref 35.0–45.0)
Hemoglobin: 12.9 g/dL (ref 11.7–15.5)
Lymphs Abs: 1690 cells/uL (ref 850–3900)
MCH: 30.7 pg (ref 27.0–33.0)
MCHC: 33.1 g/dL (ref 32.0–36.0)
MCV: 92.9 fL (ref 80.0–100.0)
MPV: 10 fL (ref 7.5–12.5)
Monocytes Relative: 4.6 %
Neutro Abs: 4336 cells/uL (ref 1500–7800)
Neutrophils Relative %: 66.7 %
Platelets: 260 10*3/uL (ref 140–400)
RBC: 4.2 10*6/uL (ref 3.80–5.10)
RDW: 12 % (ref 11.0–15.0)
Total Lymphocyte: 26 %
WBC mixed population: 299 cells/uL (ref 200–950)
WBC: 6.5 10*3/uL (ref 3.8–10.8)

## 2017-07-25 LAB — HEMOGLOBIN A1C
Hgb A1c MFr Bld: 5.3 % of total Hgb (ref <5.7)
Mean Plasma Glucose: 105 (calc)
eAG (mmol/L): 5.8 (calc)

## 2017-07-25 LAB — VITAMIN D 25 HYDROXY (VIT D DEFICIENCY, FRACTURES): Vit D, 25-Hydroxy: 17 ng/mL — ABNORMAL LOW (ref 30–100)

## 2017-07-25 LAB — INSULIN, RANDOM: Insulin: 4.2 u[IU]/mL (ref 2.0–19.6)

## 2017-07-26 ENCOUNTER — Other Ambulatory Visit: Payer: Self-pay | Admitting: Family Medicine

## 2017-07-26 MED ORDER — VITAMIN D (ERGOCALCIFEROL) 1.25 MG (50000 UNIT) PO CAPS: 50000.0000 [IU] | ORAL_CAPSULE | ORAL | 0 refills | Status: DC

## 2017-07-31 ENCOUNTER — Ambulatory Visit: Payer: Medicaid Other | Admitting: Family Medicine

## 2017-07-31 ENCOUNTER — Ambulatory Visit (INDEPENDENT_AMBULATORY_CARE_PROVIDER_SITE_OTHER): Payer: Medicaid Other | Admitting: Family Medicine

## 2017-07-31 ENCOUNTER — Encounter: Payer: Self-pay | Admitting: Family Medicine

## 2017-07-31 VITALS — BP 100/80 | HR 93 | Temp 93.0°F | Ht 64.0 in | Wt 159.9 lb

## 2017-07-31 DIAGNOSIS — I498 Other specified cardiac arrhythmias: Secondary | ICD-10-CM

## 2017-07-31 DIAGNOSIS — E785 Hyperlipidemia, unspecified: Secondary | ICD-10-CM | POA: Diagnosis not present

## 2017-07-31 DIAGNOSIS — R079 Chest pain, unspecified: Secondary | ICD-10-CM | POA: Diagnosis not present

## 2017-07-31 NOTE — Progress Notes (Signed)
Name: Virginia Walls   MRN: [id number]    DOB: October 09, 1981   Date:07/31/2017       Progress Note  Subjective  Chief Complaint  Chief Complaint  Patient presents with  . Chest Pain    continues to have chest pain. Intermittent. No change or improvement since last visit.  . Palpitations    continue to have flutters. No change or improvement in symptoms since last visit    HPI  Chest pain/palpitatoin she has a history of anxiety and depression, previous history of panic attacks, states works a lot, recently mother diagnosed with brain tumor, however normal PHQ9 and GAD7. She states symptoms do not happen when worried, it can happen any time of the day , even when resting. . She states episodes of chest pain are brief, about 2 -5  minutes, associated with sob and palpitation ( fluttering sensation in her left side of chest).Symptoms started over the past few  months, episodes down from multiple times a day to about 3-4 times a week. Seems to improve when she drinks water. Reviewed labs with patient today   Dyslipidemia: HDL dropped down to 50 and LDL at 125, discussed ways to improved HDL   Patient Active Problem List   Diagnosis Date Noted  . Hyperglycemia 05/04/2015  . Tattoos 05/04/2015  . Headache, migraine 04/29/2015  . Excess weight 04/29/2015  . Restless leg 04/29/2015  . Snores 04/29/2015  . Vitamin D deficiency 04/29/2015    Past Surgical History:  Procedure Laterality Date  . ABDOMINAL HYSTERECTOMY    . TUBAL LIGATION      Family History  Problem Relation Age of Onset  . Heart disease Mother   . Asthma Mother   . Hypothyroidism Mother   . Hypothyroidism Sister   . Diabetes Sister   . Hyperlipidemia Sister   . Asthma Son   . Heart disease Paternal Grandmother        Pacemaker  . Leukemia Paternal Grandmother   . Diabetes Paternal Grandmother     Social History   Social History  . Marital status: Single    Spouse name: N/A  . Number of children: N/A  .  Years of education: N/A   Occupational History  . Not on file.   Social History Main Topics  . Smoking status: Current Some Day Smoker    Packs/day: 0.25    Years: 4.00    Types: Cigarettes    Start date: 05/04/2011  . Smokeless tobacco: Never Used  . Alcohol use 0.0 oz/week     Comment: occasionally  . Drug use: No  . Sexual activity: Yes    Partners: Male    Birth control/ protection:    Other Topics Concern  . Not on file   Social History Narrative  . No narrative on file     Current Outpatient Prescriptions:  Marland Kitchen  Vitamin D, Ergocalciferol, (DRISDOL) 50000 units CAPS capsule, Take 1 capsule (50,000 Units total) by mouth every 7 (seven) days., Disp: 12 capsule, Rfl: 0  No Known Allergies   ROS  Constitutional: Negative for fever or weight change.  Respiratory: Negative for cough and shortness of breath.   Cardiovascular: positive  for chest pain and palpitations.  Gastrointestinal: Negative for abdominal pain, no bowel changes.  Musculoskeletal: Negative for gait problem or joint swelling.  Skin: Negative for rash.  Neurological: Negative for dizziness or headache.  No other specific complaints in a complete review of systems (except as listed in HPI above).  Objective  Vitals:   07/31/17 1016  BP: 100/80  Pulse: 93  Temp: (!) 93 F (33.9 C)  TempSrc: Oral  Weight: 159 lb 14.4 oz (72.5 kg)  Height: 5\' 4"  (1.626 m)    Body mass index is 27.45 kg/m.  Physical Exam  Constitutional: Patient appears well-developed and well-nourished. Obese No distress.  HEENT: head atraumatic, normocephalic, pupils equal and reactive to light,  neck supple, throat within normal limits Cardiovascular: Normal rate, regular rhythm and normal heart sounds.  No murmur heard. No BLE edema. Pulmonary/Chest: Effort normal and breath sounds normal. No respiratory distress. Abdominal: Soft.  There is no tenderness. Psychiatric: Patient has a normal mood and affect. behavior is  normal. Judgment and thought content normal.  Recent Results (from the past 2160 hour(s))  CBC with Differential/Platelet     Status: None   Collection Time: 07/24/17  8:45 AM  Result Value Ref Range   WBC 6.5 3.8 - 10.8 Thousand/uL   RBC 4.20 3.80 - 5.10 Million/uL   Hemoglobin 12.9 11.7 - 15.5 g/dL   HCT 69.7 94.8 - 01.6 %   MCV 92.9 80.0 - 100.0 fL   MCH 30.7 27.0 - 33.0 pg   MCHC 33.1 32.0 - 36.0 g/dL   RDW 55.3 74.8 - 27.0 %   Platelets 260 140 - 400 Thousand/uL   MPV 10.0 7.5 - 12.5 fL   Neutro Abs 4,336 1,500 - 7,800 cells/uL   Lymphs Abs 1,690 850 - 3,900 cells/uL   WBC mixed population 299 200 - 950 cells/uL   Eosinophils Absolute 137 15 - 500 cells/uL   Basophils Absolute 39 0 - 200 cells/uL   Neutrophils Relative % 66.7 %   Total Lymphocyte 26.0 %   Monocytes Relative 4.6 %   Eosinophils Relative 2.1 %   Basophils Relative 0.6 %  COMPLETE METABOLIC PANEL WITH GFR     Status: Abnormal   Collection Time: 07/24/17  8:45 AM  Result Value Ref Range   Glucose, Bld 85 65 - 99 mg/dL    Comment: .            Fasting reference interval .    BUN 9 7 - 25 mg/dL   Creat 7.86 7.54 - 4.92 mg/dL   GFR, Est Non African American 104 > OR = 60 mL/min/1.13m2   GFR, Est African American 121 > OR = 60 mL/min/1.37m2   BUN/Creatinine Ratio NOT APPLICABLE 6 - 22 (calc)   Sodium 141 135 - 146 mmol/L   Potassium 4.1 3.5 - 5.3 mmol/L   Chloride 106 98 - 110 mmol/L   CO2 29 20 - 32 mmol/L   Calcium 9.1 8.6 - 10.2 mg/dL   Total Protein 7.0 6.1 - 8.1 g/dL   Albumin 3.8 3.6 - 5.1 g/dL   Globulin 3.2 1.9 - 3.7 g/dL (calc)   AG Ratio 1.2 1.0 - 2.5 (calc)   Total Bilirubin 0.3 0.2 - 1.2 mg/dL   Alkaline phosphatase (APISO) 46 33 - 115 U/L   AST 9 (L) 10 - 30 U/L   ALT 7 6 - 29 U/L  Hemoglobin A1c     Status: None   Collection Time: 07/24/17  8:45 AM  Result Value Ref Range   Hgb A1c MFr Bld 5.3 <5.7 % of total Hgb    Comment: For the purpose of screening for the presence  of diabetes: . <5.7%       Consistent with the absence of diabetes 5.7-6.4%    Consistent  with increased risk for diabetes             (prediabetes) > or =6.5%  Consistent with diabetes . This assay result is consistent with a decreased risk of diabetes. . Currently, no consensus exists regarding use of hemoglobin A1c for diagnosis of diabetes in children. . According to American Diabetes Association (ADA) guidelines, hemoglobin A1c <7.0% represents optimal control in non-pregnant diabetic patients. Different metrics may apply to specific patient populations.  Standards of Medical Care in Diabetes(ADA). .    Mean Plasma Glucose 105 (calc)   eAG (mmol/L) 5.8 (calc)  TSH     Status: None   Collection Time: 07/24/17  8:45 AM  Result Value Ref Range   TSH 1.05 mIU/L    Comment:           Reference Range .           > or = 20 Years  0.40-4.50 .                Pregnancy Ranges           First trimester    0.26-2.66           Second trimester   0.55-2.73           Third trimester    0.43-2.91   VITAMIN D 25 Hydroxy (Vit-D Deficiency, Fractures)     Status: Abnormal   Collection Time: 07/24/17  8:45 AM  Result Value Ref Range   Vit D, 25-Hydroxy 17 (L) 30 - 100 ng/mL    Comment: Vitamin D Status         25-OH Vitamin D: . Deficiency:                    <20 ng/mL Insufficiency:             20 - 29 ng/mL Optimal:                 > or = 30 ng/mL . For 25-OH Vitamin D testing on patients on  D2-supplementation and patients for whom quantitation  of D2 and D3 fractions is required, the QuestAssureD(TM) 25-OH VIT D, (D2,D3), LC/MS/MS is recommended: order  code 40814 (patients >58yrs). . For more information on this test, go to: http://education.[facility].com/faq/FAQ163 (This link is being provided for  informational/educational purposes only.)   Lipid panel     Status: Abnormal   Collection Time: 07/24/17  8:45 AM  Result Value Ref Range   Cholesterol 192 <200  mg/dL   HDL 50 (L) >48 mg/dL   Triglycerides 78 <185 mg/dL   LDL Cholesterol (Calc) 125 (H) mg/dL (calc)    Comment: Reference range: <100 . Desirable range <100 mg/dL for primary prevention;   <70 mg/dL for patients with CHD or diabetic patients  with > or = 2 CHD risk factors. Marland Kitchen LDL-C is now calculated using the Martin-Hopkins  calculation, which is a validated novel method providing  better accuracy than the Friedewald equation in the  estimation of LDL-C.  Horald Pollen et al. Lenox Ahr. 6314;970(26): 2061-2068  (http://education.[facility].com/faq/FAQ164)    Total CHOL/HDL Ratio 3.8 <5.0 (calc)   Non-HDL Cholesterol (Calc) 142 (H) <130 mg/dL (calc)    Comment: For patients with diabetes plus 1 major ASCVD risk  factor, treating to a non-HDL-C goal of <100 mg/dL  (LDL-C of <37 mg/dL) is considered a therapeutic  option.   Insulin, random     Status: None   Collection Time: 07/24/17  8:45 AM  Result Value Ref Range   Insulin 4.2 2.0 - 19.6 uIU/mL    Comment: This insulin assay shows strong cross-reactivity for some insulin analogs (lispro, aspart, and glargine) and much lower cross-reactivity with others (detemir, glulisine).   WET PREP BY MOLECULAR PROBE     Status: Abnormal   Collection Time: 07/24/17  9:51 AM  Result Value Ref Range   MICRO NUMBER: [id number]    SPECIMEN QUALITY: ADEQUATE    SOURCE: NOT GIVEN    STATUS: FINAL    Trichomonas vaginosis Not Detected    Gardnerella vaginalis (A)     Detected. Increased levels of G. vaginalis may not be significant in the absence of signs and symptoms of bacterial vaginosis.   Candida species Not Detected      PHQ2/9: Depression screen [facility] 2/9 12/15/2015 10/20/2015 05/04/2015  Decreased Interest 0 0 1  Down, Depressed, Hopeless 0 0 1  PHQ - 2 Score 0 0 2  Altered sleeping - - 2  Tired, decreased energy - - 1  Change in appetite - - 2  Feeling bad or failure about yourself  - - 1  Trouble concentrating - - 1  Moving  slowly or fidgety/restless - - 0  Suicidal thoughts - - 0  PHQ-9 Score - - 9  Difficult doing work/chores - - Not difficult at all     Fall Risk: Fall Risk  07/24/2017 12/15/2015 10/20/2015 05/04/2015  Falls in the past year? No No No No    GAD 7 : Generalized Anxiety Score 07/31/2017  Nervous, Anxious, on Edge 0  Control/stop worrying 1  Worry too much - different things 1  Trouble relaxing 0  Restless 0  Easily annoyed or irritable 1  Afraid - awful might happen 0  Total GAD 7 Score 3  Anxiety Difficulty Not difficult at all     Assessment & Plan  1. Chest pain, unspecified type  - Ambulatory referral to Cardiology Reviewed labs, no anemia, normal TSH, GAD normal, refer to cardiologist for further evaluation and possible holter  2. Dyslipidemia (high LDL; low HDL)  Lipid panel shows low HDL : to improve HDL patient  needs to eat tree nuts ( pecans/pistachios/almonds ) four times weekly, eat fish two times weekly  and exercise  at least 150 minutes per week   3. Fluttering heart  - Ambulatory referral to Cardiology

## 2017-08-12 ENCOUNTER — Encounter: Payer: Self-pay | Admitting: Family Medicine

## 2017-08-12 ENCOUNTER — Ambulatory Visit (INDEPENDENT_AMBULATORY_CARE_PROVIDER_SITE_OTHER): Payer: Medicaid Other | Admitting: Family Medicine

## 2017-08-12 VITALS — BP 100/70 | HR 69 | Temp 98.1°F | Resp 12 | Ht 64.0 in | Wt 159.6 lb

## 2017-08-12 DIAGNOSIS — K5909 Other constipation: Secondary | ICD-10-CM | POA: Diagnosis not present

## 2017-08-12 DIAGNOSIS — K644 Residual hemorrhoidal skin tags: Secondary | ICD-10-CM | POA: Diagnosis not present

## 2017-08-12 DIAGNOSIS — E559 Vitamin D deficiency, unspecified: Secondary | ICD-10-CM | POA: Diagnosis not present

## 2017-08-12 DIAGNOSIS — Z01419 Encounter for gynecological examination (general) (routine) without abnormal findings: Secondary | ICD-10-CM

## 2017-08-12 DIAGNOSIS — Z0000 Encounter for general adult medical examination without abnormal findings: Secondary | ICD-10-CM

## 2017-08-12 MED ORDER — LINACLOTIDE 72 MCG PO CAPS: 72.0000 ug | ORAL_CAPSULE | Freq: Every day | ORAL | 0 refills | Status: DC

## 2017-08-12 MED ORDER — POLYETHYLENE GLYCOL 3350 17 GM/SCOOP PO POWD: 17.0000 g | Freq: Two times a day (BID) | ORAL | 0 refills | Status: DC | PRN

## 2017-08-12 MED ORDER — HYDROCORTISONE ACETATE 25 MG RE SUPP: 25.0000 mg | Freq: Two times a day (BID) | RECTAL | 0 refills | Status: DC

## 2017-08-12 NOTE — Patient Instructions (Signed)
Start Miralax first and take Linzess after gut is cleaned out.   Preventive Care 18-39 Years, Female Preventive care refers to lifestyle choices and visits with your health care provider that can promote health and wellness. What does preventive care include?  A yearly physical exam. This is also called an annual well check.  Dental exams once or twice a year.  Routine eye exams. Ask your health care provider how often you should have your eyes checked.  Personal lifestyle choices, including: ? Daily care of your teeth and gums. ? Regular physical activity. ? Eating a healthy diet. ? Avoiding tobacco and drug use. ? Limiting alcohol use. ? Practicing safe sex. ? Taking vitamin and mineral supplements as recommended by your health care provider. What happens during an annual well check? The services and screenings done by your health care provider during your annual well check will depend on your age, overall health, lifestyle risk factors, and family history of disease. Counseling Your health care provider may ask you questions about your:  Alcohol use.  Tobacco use.  Drug use.  Emotional well-being.  Home and relationship well-being.  Sexual activity.  Eating habits.  Work and work Statistician.  Method of birth control.  Menstrual cycle.  Pregnancy history.  Screening You may have the following tests or measurements:  Height, weight, and BMI.  Diabetes screening. This is done by checking your blood sugar (glucose) after you have not eaten for a while (fasting).  Blood pressure.  Lipid and cholesterol levels. These may be checked every 5 years starting at age 32.  Skin check.  Hepatitis C blood test.  Hepatitis B blood test.  Sexually transmitted disease (STD) testing.  BRCA-related cancer screening. This may be done if you have a family history of breast, ovarian, tubal, or peritoneal cancers.  Pelvic exam and Pap test. This may be done every 3  years starting at age 3. Starting at age 59, this may be done every 5 years if you have a Pap test in combination with an HPV test.  Discuss your test results, treatment options, and if necessary, the need for more tests with your health care provider. Vaccines Your health care provider may recommend certain vaccines, such as:  Influenza vaccine. This is recommended every year.  Tetanus, diphtheria, and acellular pertussis (Tdap, Td) vaccine. You may need a Td booster every 10 years.  Varicella vaccine. You may need this if you have not been vaccinated.  HPV vaccine. If you are 32 or younger, you may need three doses over 6 months.  Measles, mumps, and rubella (MMR) vaccine. You may need at least one dose of MMR. You may also need a second dose.  Pneumococcal 13-valent conjugate (PCV13) vaccine. You may need this if you have certain conditions and were not previously vaccinated.  Pneumococcal polysaccharide (PPSV23) vaccine. You may need one or two doses if you smoke cigarettes or if you have certain conditions.  Meningococcal vaccine. One dose is recommended if you are age 87-21 years and a first-year college student living in a residence hall, or if you have one of several medical conditions. You may also need additional booster doses.  Hepatitis A vaccine. You may need this if you have certain conditions or if you travel or work in places where you may be exposed to hepatitis A.  Hepatitis B vaccine. You may need this if you have certain conditions or if you travel or work in places where you may be exposed to hepatitis  B.  Haemophilus influenzae type b (Hib) vaccine. You may need this if you have certain risk factors.  Talk to your health care provider about which screenings and vaccines you need and how often you need them. This information is not intended to replace advice given to you by your health care provider. Make sure you discuss any questions you have with your health care  provider. Document Released: 12/03/2001 Document Revised: 06/26/2016 Document Reviewed: 08/08/2015 Elsevier Interactive Patient Education  2017 [employer].

## 2017-08-12 NOTE — Progress Notes (Signed)
Name: Virginia Walls   MRN: [id number]    DOB: 08-Oct-1981   Date:08/12/2017       Progress Note  Subjective  Chief Complaint  Chief Complaint  Patient presents with  . Annual Exam    HPI  Well Woman: she had a hysterectomy, not for cancer. She is sexually active, no pain during sex, vaginal discharge. No bladder problems. Denies domestic violence.   Discussed lab results with patient today, life style modification, continue vitamin D supplementation  Constipation: she has a long history of constipation, usually to strain and Bristol scale is 1-2, however yesterday symptoms were worse, started with stool of 1 and straining, had to bend forward to have a bowel movement, and towards the end of the day Bristol 5-6. Denies blood in stools, she had some nausea but no vomiting, no fever or chills. Appetite is slightly low because of mild abdominal pain that is still present on epigastric area. She has been passing gas. She felt bloated yesterday. She never took medication for constipation. Her bowel movements are at most two to three times weekly all her life, with episodes of worsening associated with pain, like yesterday.    Patient Active Problem List   Diagnosis Date Noted  . Chronic constipation 08/12/2017  . Hyperglycemia 05/04/2015  . Tattoos 05/04/2015  . Headache, migraine 04/29/2015  . Excess weight 04/29/2015  . Restless leg 04/29/2015  . Snores 04/29/2015  . Vitamin D deficiency 04/29/2015    Past Surgical History:  Procedure Laterality Date  . ABDOMINAL HYSTERECTOMY    . TUBAL LIGATION      Family History  Problem Relation Age of Onset  . Heart disease Mother   . Asthma Mother   . Hypothyroidism Mother   . Hypothyroidism Sister   . Diabetes Sister   . Hyperlipidemia Sister   . Asthma Son   . Heart disease Paternal Grandmother        Pacemaker  . Leukemia Paternal Grandmother   . Diabetes Paternal Grandmother     Social History   Social History  . Marital  status: Single    Spouse name: N/A  . Number of children: 3  . Years of education: N/A   Occupational History  . caretaker     Always [employer]   Social History Main Topics  . Smoking status: Current Some Day Smoker    Packs/day: 0.25    Years: 4.00    Types: Cigarettes    Start date: 05/04/2011  . Smokeless tobacco: Never Used  . Alcohol use 0.0 oz/week     Comment: occasionally  . Drug use: No  . Sexual activity: Yes    Partners: Male    Birth control/ protection:    Other Topics Concern  . Not on file   Social History Narrative   Lives with same person / boyfriend for the past 16 years   She has three son. 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      Current Outpatient Prescriptions:  Marland Kitchen.  Vitamin D, Ergocalciferol, (DRISDOL) 50000 units CAPS capsule, Take 1 capsule (50,000 Units total) by mouth every 7 (seven) days., Disp: 12 capsule, Rfl: 0 .  linaclotide (LINZESS) 72 MCG capsule, Take 1 capsule (72 mcg total) by mouth daily before breakfast., Disp: 30 capsule, Rfl: 0 .  polyethylene glycol powder (GLYCOLAX/MIRALAX) powder, Take 17 g by mouth 2 (two) times daily as needed.  Mixed with 8 ounces fluid, Disp: 3350 g, Rfl: 0  No Known Allergies   ROS  Constitutional: Negative for fever or weight change.  Respiratory: Negative for cough and shortness of breath.   Cardiovascular: Negative for chest pain or palpitations.  Gastrointestinal: Positive for mild abdominal pain, no bowel changes.  Musculoskeletal: Negative for gait problem or joint swelling.  Skin: Negative for rash.  Neurological: Negative for dizziness or headache.  No other specific complaints in a complete review of systems (except as listed in HPI above).  Objective  Vitals:   08/12/17 0911  BP: 100/70  Pulse: 69  Resp: 12  Temp: 98.1 F (36.7 C)  TempSrc: Oral  SpO2: 99%  Weight: 159 lb 9.6 oz (72.4 kg)  Height: 5\' 4"   (1.626 m)    Body mass index is 27.4 kg/m.  Physical Exam  Constitutional: Patient appears well-developed and well-nourished. No distress.  HENT: Head: Normocephalic and atraumatic. Ears: B TMs ok, no erythema or effusion; Nose: Nose normal. Mouth/Throat: Oropharynx is clear and moist. No oropharyngeal exudate.  Eyes: Conjunctivae and EOM are normal. Pupils are equal, round, and reactive to light. No scleral icterus.  Neck: Normal range of motion. Neck supple. No JVD present. No thyromegaly present.  Cardiovascular: Normal rate, regular rhythm and normal heart sounds.  No murmur heard. No BLE edema. Pulmonary/Chest: Effort normal and breath sounds normal. No respiratory distress. Abdominal: Soft. Bowel sounds are normal, no distension. There is no tenderness. no masses. Stretch marks Breast: no lumps or masses, no nipple discharge or rashes FEMALE GENITALIA:  Not done RECTAL: no rectal masses but she has external  Hemorrhoid, tender to touch, some stools on rectum, brown Musculoskeletal: Normal range of motion, no joint effusions. No gross deformities Neurological: he is alert and oriented to person, place, and time. No cranial nerve deficit. Coordination, balance, strength, speech and gait are normal.  Skin: Skin is warm and dry. No rash noted. No erythema.  Psychiatric: Patient has a normal mood and affect. behavior is normal. Judgment and thought content normal.   Recent Results (from the past 2160 hour(s))  CBC with Differential/Platelet     Status: None   Collection Time: 07/24/17  8:45 AM  Result Value Ref Range   WBC 6.5 3.8 - 10.8 Thousand/uL   RBC 4.20 3.80 - 5.10 Million/uL   Hemoglobin 12.9 11.7 - 15.5 g/dL   HCT 09/23/17 05.3 - 97.6 %   MCV 92.9 80.0 - 100.0 fL   MCH 30.7 27.0 - 33.0 pg   MCHC 33.1 32.0 - 36.0 g/dL   RDW 73.4 19.3 - 79.0 %   Platelets 260 140 - 400 Thousand/uL   MPV 10.0 7.5 - 12.5 fL   Neutro Abs 4,336 1,500 - 7,800 cells/uL   Lymphs Abs 1,690 850 -  3,900 cells/uL   WBC mixed population 299 200 - 950 cells/uL   Eosinophils Absolute 137 15 - 500 cells/uL   Basophils Absolute 39 0 - 200 cells/uL   Neutrophils Relative % 66.7 %   Total Lymphocyte 26.0 %   Monocytes Relative 4.6 %   Eosinophils Relative 2.1 %   Basophils Relative 0.6 %  COMPLETE METABOLIC PANEL WITH GFR     Status: Abnormal   Collection Time: 07/24/17  8:45 AM  Result Value Ref Range   Glucose, Bld 85 65 - 99 mg/dL    Comment: .            Fasting reference interval .  BUN 9 7 - 25 mg/dL   Creat 1.03 1.28 - 1.18 mg/dL   GFR, Est Non African American 104 > OR = 60 mL/min/1.44m2   GFR, Est African American 121 > OR = 60 mL/min/1.46m2   BUN/Creatinine Ratio NOT APPLICABLE 6 - 22 (calc)   Sodium 141 135 - 146 mmol/L   Potassium 4.1 3.5 - 5.3 mmol/L   Chloride 106 98 - 110 mmol/L   CO2 29 20 - 32 mmol/L   Calcium 9.1 8.6 - 10.2 mg/dL   Total Protein 7.0 6.1 - 8.1 g/dL   Albumin 3.8 3.6 - 5.1 g/dL   Globulin 3.2 1.9 - 3.7 g/dL (calc)   AG Ratio 1.2 1.0 - 2.5 (calc)   Total Bilirubin 0.3 0.2 - 1.2 mg/dL   Alkaline phosphatase (APISO) 46 33 - 115 U/L   AST 9 (L) 10 - 30 U/L   ALT 7 6 - 29 U/L  Hemoglobin A1c     Status: None   Collection Time: 07/24/17  8:45 AM  Result Value Ref Range   Hgb A1c MFr Bld 5.3 <5.7 % of total Hgb    Comment: For the purpose of screening for the presence of diabetes: . <5.7%       Consistent with the absence of diabetes 5.7-6.4%    Consistent with increased risk for diabetes             (prediabetes) > or =6.5%  Consistent with diabetes . This assay result is consistent with a decreased risk of diabetes. . Currently, no consensus exists regarding use of hemoglobin A1c for diagnosis of diabetes in children. . According to American Diabetes Association (ADA) guidelines, hemoglobin A1c <7.0% represents optimal control in non-pregnant diabetic patients. Different metrics may apply to specific patient populations.  Standards  of Medical Care in Diabetes(ADA). .    Mean Plasma Glucose 105 (calc)   eAG (mmol/L) 5.8 (calc)  TSH     Status: None   Collection Time: 07/24/17  8:45 AM  Result Value Ref Range   TSH 1.05 mIU/L    Comment:           Reference Range .           > or = 20 Years  0.40-4.50 .                Pregnancy Ranges           First trimester    0.26-2.66           Second trimester   0.55-2.73           Third trimester    0.43-2.91   VITAMIN D 25 Hydroxy (Vit-D Deficiency, Fractures)     Status: Abnormal   Collection Time: 07/24/17  8:45 AM  Result Value Ref Range   Vit D, 25-Hydroxy 17 (L) 30 - 100 ng/mL    Comment: Vitamin D Status         25-OH Vitamin D: . Deficiency:                    <20 ng/mL Insufficiency:             20 - 29 ng/mL Optimal:                 > or = 30 ng/mL . For 25-OH Vitamin D testing on patients on  D2-supplementation and patients for whom quantitation  of D2 and D3 fractions is required, the QuestAssureD(TM) 25-OH  VIT D, (D2,D3), LC/MS/MS is recommended: order  code 01751 (patients >66yrs). . For more information on this test, go to: http://education.[facility].com/faq/FAQ163 (This link is being provided for  informational/educational purposes only.)   Lipid panel     Status: Abnormal   Collection Time: 07/24/17  8:45 AM  Result Value Ref Range   Cholesterol 192 <200 mg/dL   HDL 50 (L) >02 mg/dL   Triglycerides 78 <585 mg/dL   LDL Cholesterol (Calc) 125 (H) mg/dL (calc)    Comment: Reference range: <100 . Desirable range <100 mg/dL for primary prevention;   <70 mg/dL for patients with CHD or diabetic patients  with > or = 2 CHD risk factors. Marland Kitchen LDL-C is now calculated using the Martin-Hopkins  calculation, which is a validated novel method providing  better accuracy than the Friedewald equation in the  estimation of LDL-C.  Horald Pollen et al. Lenox Ahr. 2778;242(35): 2061-2068  (http://education.[facility].com/faq/FAQ164)    Total CHOL/HDL  Ratio 3.8 <5.0 (calc)   Non-HDL Cholesterol (Calc) 142 (H) <130 mg/dL (calc)    Comment: For patients with diabetes plus 1 major ASCVD risk  factor, treating to a non-HDL-C goal of <100 mg/dL  (LDL-C of <36 mg/dL) is considered a therapeutic  option.   Insulin, random     Status: None   Collection Time: 07/24/17  8:45 AM  Result Value Ref Range   Insulin 4.2 2.0 - 19.6 uIU/mL    Comment: This insulin assay shows strong cross-reactivity for some insulin analogs (lispro, aspart, and glargine) and much lower cross-reactivity with others (detemir, glulisine).   WET PREP BY MOLECULAR PROBE     Status: Abnormal   Collection Time: 07/24/17  9:51 AM  Result Value Ref Range   MICRO NUMBER: [id number]    SPECIMEN QUALITY: ADEQUATE    SOURCE: NOT GIVEN    STATUS: FINAL    Trichomonas vaginosis Not Detected    Gardnerella vaginalis (A)     Detected. Increased levels of G. vaginalis may not be significant in the absence of signs and symptoms of bacterial vaginosis.   Candida species Not Detected       PHQ2/9: Depression screen [facility] 2/9 12/15/2015 10/20/2015 05/04/2015  Decreased Interest 0 0 1  Down, Depressed, Hopeless 0 0 1  PHQ - 2 Score 0 0 2  Altered sleeping - - 2  Tired, decreased energy - - 1  Change in appetite - - 2  Feeling bad or failure about yourself  - - 1  Trouble concentrating - - 1  Moving slowly or fidgety/restless - - 0  Suicidal thoughts - - 0  PHQ-9 Score - - 9  Difficult doing work/chores - - Not difficult at all     Fall Risk: Fall Risk  07/24/2017 12/15/2015 10/20/2015 05/04/2015  Falls in the past year? No No No No   Functional Status Survey: Is the patient deaf or have difficulty hearing?: No Does the patient have difficulty seeing, even when wearing glasses/contacts?: No Does the patient have difficulty concentrating, remembering, or making decisions?: No Does the patient have difficulty walking or climbing stairs?: No Does the patient have difficulty  dressing or bathing?: No Does the patient have difficulty doing errands alone such as visiting a doctor's office or shopping?: No    Current Exercise Habits: Home exercise routine, Type of exercise: Other - see comments (DVD at home), Time (Minutes): 30, Frequency (Times/Week): 3, Weekly Exercise (Minutes/Week): 90, Intensity: Moderate Exercise limited by: None identified  Assessment & Plan  1.  Well woman exam  Discussed importance of 150 minutes of physical activity weekly, eat two servings of fish weekly, eat one serving of tree nuts ( cashews, pistachios, pecans, almonds.Marland Kitchen) every other day, eat 6 servings of fruit/vegetables daily and drink plenty of water and avoid sweet beverages.   2. Chronic constipation  Discussed dietary modification, increase fiber in diet - linaclotide (LINZESS) 72 MCG capsule; Take 1 capsule (72 mcg total) by mouth daily before breakfast.  Dispense: 30 capsule; Refill: 0 - polyethylene glycol powder (GLYCOLAX/MIRALAX) powder; Take 17 g by mouth 2 (two) times daily as needed. Mixed with 8 ounces fluid  Dispense: 3350 g; Refill: 0  3. Vitamin D deficiency  Continue supplementation   4. External hemorrhoid  - hydrocortisone (ANUSOL-HC) 25 MG suppository; Place 1 suppository (25 mg total) rectally 2 (two) times daily.  Dispense: 12 suppository; Refill: 0  -Red flags and when to present for emergency care or RTC including fever >101.87F,  new/worsening/un-resolving symptoms, blood in stools  reviewed with patient at time of visit. Follow up and care instructions discussed and provided in AVS.

## 2017-08-27 ENCOUNTER — Ambulatory Visit: Payer: Medicaid Other | Admitting: Family Medicine

## 2017-08-27 ENCOUNTER — Encounter: Payer: Self-pay | Admitting: Family Medicine

## 2017-08-27 VITALS — BP 106/64 | HR 90 | Temp 98.5°F | Resp 18 | Ht 64.0 in | Wt 163.0 lb

## 2017-08-27 DIAGNOSIS — K5909 Other constipation: Secondary | ICD-10-CM | POA: Diagnosis not present

## 2017-08-27 DIAGNOSIS — N760 Acute vaginitis: Secondary | ICD-10-CM | POA: Diagnosis not present

## 2017-08-27 DIAGNOSIS — E559 Vitamin D deficiency, unspecified: Secondary | ICD-10-CM | POA: Diagnosis not present

## 2017-08-27 DIAGNOSIS — G43009 Migraine without aura, not intractable, without status migrainosus: Secondary | ICD-10-CM | POA: Diagnosis not present

## 2017-08-27 DIAGNOSIS — G4440 Drug-induced headache, not elsewhere classified, not intractable: Secondary | ICD-10-CM

## 2017-08-27 LAB — WET PREP BY MOLECULAR PROBE
Candida species: NOT DETECTED
MICRO NUMBER:: 81252465
SPECIMEN QUALITY:: ADEQUATE
Trichomonas vaginosis: NOT DETECTED

## 2017-08-27 MED ORDER — FLUCONAZOLE 150 MG PO TABS: 150.0000 mg | ORAL_TABLET | ORAL | 0 refills | Status: DC

## 2017-08-27 MED ORDER — CLINDAMYCIN PHOSPHATE 2 % VA CREA: 1.0000 | TOPICAL_CREAM | Freq: Every day | VAGINAL | 0 refills | Status: DC

## 2017-08-27 MED ORDER — TOPIRAMATE 50 MG PO TABS: 25.0000 mg | ORAL_TABLET | Freq: Two times a day (BID) | ORAL | 0 refills | Status: DC

## 2017-08-27 NOTE — Patient Instructions (Signed)
Start with 25 mg of topamax before bed and increase by one pill every 3 days to a max of 2 pills twice daily

## 2017-08-27 NOTE — Progress Notes (Signed)
Name: Virginia Walls   MRN: [id number]    DOB: 10-Oct-1981   Date:08/27/2017       Progress Note  Subjective  Chief Complaint  Chief Complaint  Patient presents with  . Vaginal Itching    Onset-1 week, vaginal irritation, itching, white discharge.    HPI  Vaginitis: She had a hysterectomy and same sexual partner ( past 17 years). She had an episode of BV about one month ago and was treated, symptoms resolved, over the past week she noticed itching, mild milky discharge and symptoms are worse now. No fever or chills. Worse symptoms is itching, no rashes.   Migraine headaches: she states she is doing well, episodes at most once a week, on right temporal area and radiates to nuchal area. Resolves with Excedrin migraine and a 30 minute rest. Sometimes associated photophobia, phonophobia, also occasionally has nausea. However she has noticed a different type of headache, going on for a couple of weeks. Described as dull with episodes of sharp sensation, usually right nuchal area, she has been taking Excedrin migraine more often, 24 in one week. Discussed importance of stopping nsaid's or tylenol for pain, avoid caffeine, have enough sleep and drink water.   Constipation: doing better, having bowel movements once or twice daily, no blood in stools, taking Linzess daily and no longer needs Miralax  Patient Active Problem List   Diagnosis Date Noted  . Chronic constipation 08/12/2017  . Hyperglycemia 05/04/2015  . Tattoos 05/04/2015  . Headache, migraine 04/29/2015  . Excess weight 04/29/2015  . Restless leg 04/29/2015  . Snores 04/29/2015  . Vitamin D deficiency 04/29/2015    Past Surgical History:  Procedure Laterality Date  . ABDOMINAL HYSTERECTOMY    . TUBAL LIGATION      Family History  Problem Relation Age of Onset  . Heart disease Mother   . Asthma Mother   . Hypothyroidism Mother   . Hypothyroidism Sister   . Diabetes Sister   . Hyperlipidemia Sister   . Asthma Son   .  Heart disease Paternal Grandmother        Pacemaker  . Leukemia Paternal Grandmother   . Diabetes Paternal Grandmother     Social History   Socioeconomic History  . Marital status: Single    Spouse name: Not on file  . Number of children: 3  . Years of education: Not on file  . Highest education level: Not on file  Social Needs  . Financial resource strain: Not on file  . Food insecurity - worry: Not on file  . Food insecurity - inability: Not on file  . Transportation needs - medical: Not on file  . Transportation needs - non-medical: Not on file  Occupational History  . Occupation: caretaker    Comment: Always [employer]  Tobacco Use  . Smoking status: Current Some Day Smoker    Packs/day: 0.25    Years: 4.00    Pack years: 1.00    Types: Cigarettes    Start date: 05/04/2011  . Smokeless tobacco: Never Used  Substance and Sexual Activity  . Alcohol use: Yes    Alcohol/week: 0.0 oz    Comment: occasionally  . Drug use: No  . Sexual activity: Yes    Partners: Male  Other Topics Concern  . Not on file  Social History Narrative   Lives with same person / boyfriend for the past 16 years   She has three son. 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      Current Outpatient Medications:  .  hydrocortisone (ANUSOL-HC) 25 MG suppository, Place 1 suppository (25 mg total) rectally 2 (two) times daily., Disp: 12 suppository, Rfl: 0 .  linaclotide (LINZESS) 72 MCG capsule, Take 1 capsule (72 mcg total) by mouth daily before breakfast., Disp: 30 capsule, Rfl: 0 .  polyethylene glycol powder (GLYCOLAX/MIRALAX) powder, Take 17 g by mouth 2 (two) times daily as needed. Mixed with 8 ounces fluid, Disp: 3350 g, Rfl: 0 .  Vitamin D, Ergocalciferol, (DRISDOL) 50000 units CAPS capsule, Take 1 capsule (50,000 Units total) by mouth every 7 (seven) days., Disp: 12 capsule, Rfl: 0  No Known  Allergies   ROS  Constitutional: Negative for fever or weight change.  Respiratory: Negative for cough and shortness of breath.   Cardiovascular: Negative for chest pain or palpitations.  Gastrointestinal: Negative for abdominal pain, no bowel changes ( doing better on medication) .  Musculoskeletal: Negative for gait problem or joint swelling.  Skin: Negative for rash.  Neurological: Negative for dizziness or headache.  No other specific complaints in a complete review of systems (except as listed in HPI above).  Objective  Vitals:   08/27/17 0952  BP: 106/64  Pulse: 90  Resp: 18  Temp: 98.5 F (36.9 C)  TempSrc: Oral  SpO2: 98%  Weight: 163 lb (73.9 kg)  Height: 5\' 4"  (1.626 m)    Body mass index is 27.98 kg/m.  Physical Exam  Constitutional: Patient appears well-developed and well-nourished. Obese  No distress.  HEENT: head atraumatic, normocephalic, pupils equal and reactive to light,neck supple, throat within normal limits Cardiovascular: Normal rate, regular rhythm and normal heart sounds.  No murmur heard. No BLE edema. Pulmonary/Chest: Effort normal and breath sounds normal. No respiratory distress. Abdominal: Soft.  There is no tenderness. GYN: milky discharge, no cervix, normal vulva, normal bimanual exam, wet smear collected and sent to lab Psychiatric: Patient has a normal mood and affect. behavior is normal. Judgment and thought content normal.  Recent Results (from the past 2160 hour(s))  CBC with Differential/Platelet     Status: None   Collection Time: 07/24/17  8:45 AM  Result Value Ref Range   WBC 6.5 3.8 - 10.8 Thousand/uL   RBC 4.20 3.80 - 5.10 Million/uL   Hemoglobin 12.9 11.7 - 15.5 g/dL   HCT 05.1 10.2 - 11.1 %   MCV 92.9 80.0 - 100.0 fL   MCH 30.7 27.0 - 33.0 pg   MCHC 33.1 32.0 - 36.0 g/dL   RDW 73.5 67.0 - 14.1 %   Platelets 260 140 - 400 Thousand/uL   MPV 10.0 7.5 - 12.5 fL   Neutro Abs 4,336 1,500 - 7,800 cells/uL   Lymphs Abs 1,690  850 - 3,900 cells/uL   WBC mixed population 299 200 - 950 cells/uL   Eosinophils Absolute 137 15 - 500 cells/uL   Basophils Absolute 39 0 - 200 cells/uL   Neutrophils Relative % 66.7 %   Total Lymphocyte 26.0 %   Monocytes Relative 4.6 %   Eosinophils Relative 2.1 %   Basophils Relative 0.6 %  COMPLETE METABOLIC PANEL WITH GFR     Status: Abnormal   Collection Time: 07/24/17  8:45 AM  Result Value Ref Range   Glucose, Bld 85 65 - 99 mg/dL    Comment: .            Fasting reference interval .  BUN 9 7 - 25 mg/dL   Creat 1.61 0.96 - 0.45 mg/dL   GFR, Est Non African American 104 > OR = 60 mL/min/1.52m2   GFR, Est African American 121 > OR = 60 mL/min/1.59m2   BUN/Creatinine Ratio NOT APPLICABLE 6 - 22 (calc)   Sodium 141 135 - 146 mmol/L   Potassium 4.1 3.5 - 5.3 mmol/L   Chloride 106 98 - 110 mmol/L   CO2 29 20 - 32 mmol/L   Calcium 9.1 8.6 - 10.2 mg/dL   Total Protein 7.0 6.1 - 8.1 g/dL   Albumin 3.8 3.6 - 5.1 g/dL   Globulin 3.2 1.9 - 3.7 g/dL (calc)   AG Ratio 1.2 1.0 - 2.5 (calc)   Total Bilirubin 0.3 0.2 - 1.2 mg/dL   Alkaline phosphatase (APISO) 46 33 - 115 U/L   AST 9 (L) 10 - 30 U/L   ALT 7 6 - 29 U/L  Hemoglobin A1c     Status: None   Collection Time: 07/24/17  8:45 AM  Result Value Ref Range   Hgb A1c MFr Bld 5.3 <5.7 % of total Hgb    Comment: For the purpose of screening for the presence of diabetes: . <5.7%       Consistent with the absence of diabetes 5.7-6.4%    Consistent with increased risk for diabetes             (prediabetes) > or =6.5%  Consistent with diabetes . This assay result is consistent with a decreased risk of diabetes. . Currently, no consensus exists regarding use of hemoglobin A1c for diagnosis of diabetes in children. . According to American Diabetes Association (ADA) guidelines, hemoglobin A1c <7.0% represents optimal control in non-pregnant diabetic patients. Different metrics may apply to specific patient populations.   Standards of Medical Care in Diabetes(ADA). .    Mean Plasma Glucose 105 (calc)   eAG (mmol/L) 5.8 (calc)  TSH     Status: None   Collection Time: 07/24/17  8:45 AM  Result Value Ref Range   TSH 1.05 mIU/L    Comment:           Reference Range .           > or = 20 Years  0.40-4.50 .                Pregnancy Ranges           First trimester    0.26-2.66           Second trimester   0.55-2.73           Third trimester    0.43-2.91   VITAMIN D 25 Hydroxy (Vit-D Deficiency, Fractures)     Status: Abnormal   Collection Time: 07/24/17  8:45 AM  Result Value Ref Range   Vit D, 25-Hydroxy 17 (L) 30 - 100 ng/mL    Comment: Vitamin D Status         25-OH Vitamin D: . Deficiency:                    <20 ng/mL Insufficiency:             20 - 29 ng/mL Optimal:                 > or = 30 ng/mL . For 25-OH Vitamin D testing on patients on  D2-supplementation and patients for whom quantitation  of D2 and D3 fractions is required, the QuestAssureD(TM) 25-OH  VIT D, (D2,D3), LC/MS/MS is recommended: order  code 91694 (patients >63yrs). . For more information on this test, go to: http://education.quest[facility]ostics.com/faq/FAQ163 (This link is being provided for  informational/educational purposes only.)   Lipid panel     Status: Abnormal   Collection Time: 07/24/17  8:45 AM  Result Value Ref Range   Cholesterol 192 <200 mg/dL   HDL 50 (L) >50 mg/dL   Triglycerides 78 <388 mg/dL   LDL Cholesterol (Calc) 125 (H) mg/dL (calc)    Comment: Reference range: <100 . Desirable range <100 mg/dL for primary prevention;   <70 mg/dL for patients with CHD or diabetic patients  with > or = 2 CHD risk factors. Marland Kitchen LDL-C is now calculated using the Martin-Hopkins  calculation, which is a validated novel method providing  better accuracy than the Friedewald equation in the  estimation of LDL-C.  Horald Pollen et al. Lenox Ahr. 8280;034(91): 2061-2068  (http://education.[facility].com/faq/FAQ164)     Total CHOL/HDL Ratio 3.8 <5.0 (calc)   Non-HDL Cholesterol (Calc) 142 (H) <130 mg/dL (calc)    Comment: For patients with diabetes plus 1 major ASCVD risk  factor, treating to a non-HDL-C goal of <100 mg/dL  (LDL-C of <79 mg/dL) is considered a therapeutic  option.   Insulin, random     Status: None   Collection Time: 07/24/17  8:45 AM  Result Value Ref Range   Insulin 4.2 2.0 - 19.6 uIU/mL    Comment: This insulin assay shows strong cross-reactivity for some insulin analogs (lispro, aspart, and glargine) and much lower cross-reactivity with others (detemir, glulisine).   WET PREP BY MOLECULAR PROBE     Status: Abnormal   Collection Time: 07/24/17  9:51 AM  Result Value Ref Range   MICRO NUMBER: [id number]    SPECIMEN QUALITY: ADEQUATE    SOURCE: NOT GIVEN    STATUS: FINAL    Trichomonas vaginosis Not Detected    Gardnerella vaginalis (A)     Detected. Increased levels of G. vaginalis may not be significant in the absence of signs and symptoms of bacterial vaginosis.   Candida species Not Detected       PHQ2/9: Depression screen [facility] 2/9 08/27/2017 12/15/2015 10/20/2015 05/04/2015  Decreased Interest 0 0 0 1  Down, Depressed, Hopeless 0 0 0 1  PHQ - 2 Score 0 0 0 2  Altered sleeping - - - 2  Tired, decreased energy - - - 1  Change in appetite - - - 2  Feeling bad or failure about yourself  - - - 1  Trouble concentrating - - - 1  Moving slowly or fidgety/restless - - - 0  Suicidal thoughts - - - 0  PHQ-9 Score - - - 9  Difficult doing work/chores - - - Not difficult at all     Fall Risk: Fall Risk  08/27/2017 07/24/2017 12/15/2015 10/20/2015 05/04/2015  Falls in the past year? No No No No No     Assessment & Plan  1. Vaginitis and vulvovaginitis  We will refer to gyn if symptoms returns - WET PREP BY MOLECULAR PROBE - clindamycin (CLEOCIN) 2 % vaginal cream; Place 1 Applicatorful at bedtime vaginally.  Dispense: 40 g; Refill: 0 - fluconazole (DIFLUCAN) 150 MG tablet;  Take 1 tablet (150 mg total) every other day by mouth.  Dispense: 3 tablet; Refill: 0  2. Chronic constipation  Doing better  3. Vitamin D deficiency  Still taking supplements   4. Migraine without aura and without status migrainosus, not intractable  -  topiramate (TOPAMAX) 50 MG tablet; Take 0.5-2 tablets (25-100 mg total) 2 (two) times daily by mouth. Max of 100  Mg twice daily  Dispense: 100 tablet; Refill: 0 Discussed possible side effects and importance of titrating up slowly and also not to stop abruptly  5. Rebound headache  Stop otc medication

## 2017-09-17 ENCOUNTER — Telehealth: Payer: Self-pay

## 2017-09-17 NOTE — Telephone Encounter (Signed)
lmov to be seen sooner 09/17/17 [facility]

## 2017-10-02 ENCOUNTER — Ambulatory Visit: Payer: Self-pay | Admitting: Cardiovascular Disease

## 2017-11-12 ENCOUNTER — Ambulatory Visit: Payer: Medicaid Other | Admitting: Family Medicine

## 2017-11-12 DIAGNOSIS — R0602 Shortness of breath: Secondary | ICD-10-CM | POA: Insufficient documentation

## 2017-11-12 DIAGNOSIS — R002 Palpitations: Secondary | ICD-10-CM | POA: Insufficient documentation

## 2017-11-12 DIAGNOSIS — R079 Chest pain, unspecified: Secondary | ICD-10-CM | POA: Insufficient documentation

## 2017-11-12 NOTE — Progress Notes (Signed)
Cardiology Office Note  Date:  11/13/2017   ID:  JAIDY COTTAM, DOB [birth year], MRN [id number]  PCP:  Alba Cory, MD   Chief Complaint  Patient presents with  . Other    Referred by PCP for chest pain/ tightness, tingle in both arms, and SOB.  Meds reviewed verbally with patient.     HPI:  Virginia Walls is a 37 yo  woman with past medical history of anxiety and depression,  panic attacks, Smoker, trying to quit, not in the past few weeks Hyperlipidemia Who presents by referral from Dr. 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, "[employer] service, answers calls all night Poor sleep, work and stress "beyond tired" Told her box she needed several days off this weekend  mother diagnosed with brain tumor, on optic nerve, hearing In for knee surgery today  Recently with Lots of tingling, chest pain, tightness Shortness of breath, Fluttering in her chest Several episodes per week, now seem to happen daily Warfarin noted she is breathing heavy, did not realize she was doing it  Lab work reviewed in detail HBA1c 5.3 Total chol 190  EKG personally reviewed by myself on todays visit Shows normal sinus rhythm rate 78 bpm no significant ST or T wave changes   PMH:   has a past medical history of Back pain, Depression, Migraine, Palpitation, Panic disorder, and RLS (restless legs syndrome).  PSH:    Past Surgical History:  Procedure Laterality Date  . ABDOMINAL HYSTERECTOMY    . TUBAL LIGATION      Current Outpatient Medications  Medication Sig Dispense Refill  . topiramate (TOPAMAX) 50 MG tablet Take 0.5-2 tablets (25-100 mg total) 2 (two) times daily by mouth. Max of 100  Mg twice daily 100 tablet 0  . Vitamin D, Ergocalciferol, (DRISDOL) 50000 units CAPS  capsule Take 1 capsule (50,000 Units total) by mouth every 7 (seven) days. 12 capsule 0   No current facility-administered medications for this visit.      Allergies:   Patient has no known allergies.   Social History:  The patient  reports that she has been smoking cigarettes.  She started smoking about 6 years ago. She has a 1.00 pack-year smoking history. she has never used smokeless tobacco. She reports that she drinks alcohol. She reports that she does not use drugs.   Family History:   family history includes Asthma in her mother and son; Diabetes in her paternal grandmother and sister; Heart disease in her mother and paternal grandmother; Hyperlipidemia in her sister; Hypothyroidism in her mother and sister; Leukemia in her paternal grandmother.    Review of Systems: Review of Systems  Constitutional: Negative.   Respiratory: Positive for shortness of breath.   Cardiovascular: Positive for chest pain and palpitations.       Chest tightness  Gastrointestinal: Negative.   Musculoskeletal: Negative.   Neurological: Negative.   Psychiatric/Behavioral: The patient is nervous/anxious and has insomnia.   All other systems reviewed and are negative.    PHYSICAL EXAM: VS:  BP 108/62 (BP Location: Right Arm, Patient Position: Sitting, Cuff Size: Normal)   Pulse 78   Ht 5\' 5"  (1.651 m)   Wt 160 lb 4 oz (72.7 kg)   BMI 26.67 kg/m  , BMI Body mass  index is 26.67 kg/m. GEN: Well nourished, well developed, in no acute distress  HEENT: normal  Neck: no JVD, carotid bruits, or masses Cardiac: RRR; no murmurs, rubs, or gallops,no edema  Respiratory:  clear to auscultation bilaterally, normal work of breathing GI: soft, nontender, nondistended, + BS MS: no deformity or atrophy  Skin: warm and dry, no rash Neuro:  Strength and sensation are intact Psych: euthymic mood, full affect    Recent Labs: 07/24/2017: ALT 7; BUN 9; Creat 0.74; Hemoglobin 12.9; Platelets 260; Potassium 4.1;  Sodium 141; TSH 1.05    Lipid Panel Lab Results  Component Value Date   CHOL 192 07/24/2017   HDL 50 (L) 07/24/2017   LDLCALC 138 (H) 05/04/2015   TRIG 78 07/24/2017      Wt Readings from Last 3 Encounters:  11/13/17 160 lb 4 oz (72.7 kg)  08/27/17 163 lb (73.9 kg)  08/12/17 159 lb 9.6 oz (72.4 kg)       ASSESSMENT AND PLAN:  Chest pain with moderate risk for cardiac etiology -  Typical and atypical features, Long smoking history, hyperlipidemia Recommended stress echocardiogram to rule out ischemia Echocardiogram to evaluate symptoms of shortness of breath Unable to exclude anxiety as a cause of her symptoms  Palpitations - Less likely heart arrhythmia but recommended she watch her heart rate and blood pressure.  Testing as above  Shortness of breath - She does have smoking history, weight is higher Acute onset symptoms recently possibly correlating with increasing work stress, family stress Echocardiogram as above Suggested she work on stress reduction techniques  Chest tightness Less likely COPD Concern for anxiety, ischemia workup as detailed above  Anxiety Working 24 hours a day, 3 children Taking care of sick mother Talked about cutting back on her work schedule   Total encounter time more than 60 minutes  Greater than 50% was spent in counseling and coordination of care with the patient  Disposition:   F/U as needed  Patient was seen in consultation for Dr. Carlynn Purl and will be referred back to her office for ongoing care of the issues detailed above    Orders Placed This Encounter  Procedures  . EKG 12-Lead  . ECHOCARDIOGRAM COMPLETE  . ECHOCARDIOGRAM STRESS TEST     Signed, Dossie Arbour, M.D., Ph.D. 11/13/2017  [facility] [place], [place] [phone]

## 2017-11-13 ENCOUNTER — Encounter: Payer: Self-pay | Admitting: Cardiovascular Disease

## 2017-11-13 ENCOUNTER — Ambulatory Visit: Payer: Self-pay | Admitting: Cardiovascular Disease

## 2017-11-13 DIAGNOSIS — F419 Anxiety disorder, unspecified: Secondary | ICD-10-CM | POA: Insufficient documentation

## 2017-11-13 DIAGNOSIS — R079 Chest pain, unspecified: Secondary | ICD-10-CM

## 2017-11-13 DIAGNOSIS — R002 Palpitations: Secondary | ICD-10-CM

## 2017-11-13 DIAGNOSIS — R0602 Shortness of breath: Secondary | ICD-10-CM

## 2017-11-13 DIAGNOSIS — R0789 Other chest pain: Secondary | ICD-10-CM | POA: Insufficient documentation

## 2017-11-13 NOTE — Patient Instructions (Addendum)
Medication Instructions:   No medication changes made  Labwork:  No new labs needed  Testing/Procedures:  We will order a echocardiogram and treadmill stress echo for shortness of breath, chest tightness, dizziness  Your physician has requested that you have an echocardiogram. Echocardiography is a painless test that uses sound waves to create images of your heart. It provides your doctor with information about the size and shape of your heart and how well your heart's chambers and valves are working. This procedure takes approximately one hour. There are no restrictions for this procedure.  Your physician has requested that you have a stress echocardiogram. For further information please visit [email or web address] Please follow instruction sheet as given.     Do not drink or eat foods with caffeine for 24 hours before the test. (Chocolate, coffee, tea, or energy drinks)  If you use an inhaler, bring it with you to the test.  Do not smoke for 4 hours before the test.  Wear comfortable shoes and clothing.  Follow-Up: It was a pleasure seeing you in the office today. Please call us if you have new issues that need to be addressed before your next appt.  [phone]  Your physician wants you to follow-up in:  As needed  If you need a refill on your cardiac medications before your next appointment, please call your pharmacy.    Echocardiogram An echocardiogram, or echocardiography, uses sound waves (ultrasound) to produce an image of your heart. The echocardiogram is simple, painless, obtained within a short period of time, and offers valuable information to your health care provider. The images from an echocardiogram can provide information such as:  Evidence of coronary artery disease (CAD).  Heart size.  Heart muscle function.  Heart valve function.  Aneurysm detection.  Evidence of a past heart attack.  Fluid buildup around the heart.  Heart muscle  thickening.  Assess heart valve function.  Tell a health care provider about:  Any allergies you have.  All medicines you are taking, including vitamins, herbs, eye drops, creams, and over-the-counter medicines.  Any problems you or family members have had with anesthetic medicines.  Any blood disorders you have.  Any surgeries you have had.  Any medical conditions you have.  Whether you are pregnant or may be pregnant. What happens before the procedure? No special preparation is needed. Eat and drink normally. What happens during the procedure?  In order to produce an image of your heart, gel will be applied to your chest and a wand-like tool (transducer) will be moved over your chest. The gel will help transmit the sound waves from the transducer. The sound waves will harmlessly bounce off your heart to allow the heart images to be captured in real-time motion. These images will then be recorded.  You may need an IV to receive a medicine that improves the quality of the pictures. What happens after the procedure? You may return to your normal schedule including diet, activities, and medicines, unless your health care provider tells you otherwise. This information is not intended to replace advice given to you by your health care provider. Make sure you discuss any questions you have with your health care provider. Document Released: 10/04/2000 Document Revised: 05/25/2016 Document Reviewed: 06/14/2013 Elsevier Interactive Patient Education  2017 Elsevier Inc.  Exercise Stress Echocardiogram An exercise stress echocardiogram is a test that checks how well your heart is working. For this test, you will walk on a treadmill to make your heart beat faster. This test  uses sound waves (ultrasound) and a computer to make pictures (images) of your heart. These pictures will be taken before you exercise and after you exercise. What happens before the procedure?  Follow instructions from  your doctor about what you cannot eat or drink before the test.  Do not drink or eat anything that has caffeine in it. Stop having caffeine for 24 hours before the test.  Ask your doctor about changing or stopping your normal medicines. This is important if you take diabetes medicines or blood thinners. Ask your doctor if you should take your medicines with water before the test.  If you use an inhaler, bring it to the test.  Do not use any products that have nicotine or tobacco in them, such as cigarettes and e-cigarettes. Stop using them for 4 hours before the test. If you need help quitting, ask your doctor.  Wear comfortable shoes and clothing. What happens during the procedure?  You will be hooked up to a TV screen. Your doctor will watch the screen to see how fast your heart beats during the test.  Before you exercise, a computer will make a picture of your heart. To do this: ? A gel will be put on your chest. ? A wand will be moved over the gel. ? Sound waves from the wand will go to the computer to make the picture.  Your will start walking on a treadmill. The treadmill will start at a slow speed. It will get faster a little bit at a time. When you walk faster, your heart will beat faster.  The treadmill will be stopped when your heart is working hard.  You will lie down right away so another picture of your heart can be taken.  The test will take 30-60 minutes. What happens after the procedure?  Your heart rate and blood pressure will be watched after the test.  If your doctor says that you can, you may: ? Eat what you usually eat. ? Do your normal activities. ? Take medicines like normal. Summary  An exercise stress echocardiogram is a test that checks how well your heart is working.  Follow instructions about what you cannot eat or drink before the test. Ask your doctor if you should take your normal medicines before the test.  Stop having caffeine for 24 hours  before the test. Do not use anything with nicotine or tobacco in it for 4 hours before the test.  A computer will take a picture of your heart before you walk on a treadmill. It will take another picture when you are done walking.  Your heart rate and blood pressure will be watched after the test. This information is not intended to replace advice given to you by your health care provider. Make sure you discuss any questions you have with your health care provider. Document Released: 08/04/2009 Document Revised: 06/30/2016 Document Reviewed: 06/30/2016 Elsevier Interactive Patient Education  2017 [employer].

## 2017-11-26 ENCOUNTER — Telehealth: Payer: Self-pay | Admitting: *Deleted

## 2017-11-26 NOTE — Telephone Encounter (Signed)
No answer. Left message with reminder for appointment for echo and stress echo at 1pm and 2 pm. Also to wear comfortable walking shoes, no caffeine for 24 hours and no smoking for 24 hours prior. No patient identifiers were used.

## 2017-11-27 ENCOUNTER — Ambulatory Visit (INDEPENDENT_AMBULATORY_CARE_PROVIDER_SITE_OTHER): Payer: Self-pay

## 2017-11-27 ENCOUNTER — Other Ambulatory Visit: Payer: Self-pay

## 2017-11-27 DIAGNOSIS — R0602 Shortness of breath: Secondary | ICD-10-CM

## 2017-11-27 DIAGNOSIS — R002 Palpitations: Secondary | ICD-10-CM

## 2017-11-27 DIAGNOSIS — R079 Chest pain, unspecified: Secondary | ICD-10-CM

## 2017-11-27 LAB — ECHOCARDIOGRAM STRESS TEST
Estimated workload: 7 METS
Exercise duration (min): 5 min
Exercise duration (sec): 4 s
MPHR: 184 {beats}/min
Peak HR: 171 {beats}/min
Percent HR: 92 %
Rest HR: 70 {beats}/min

## 2017-12-05 ENCOUNTER — Telehealth: Payer: Self-pay | Admitting: Cardiovascular Disease

## 2017-12-05 NOTE — Telephone Encounter (Signed)
Patient informed. 

## 2017-12-05 NOTE — Telephone Encounter (Signed)
Pt calling us back about test results   She asks when we call please do it after 12 pm today   She is at work and can't answer her phone

## 2017-12-05 NOTE — Telephone Encounter (Signed)
Pt returning our call °Please call back ° °

## 2017-12-10 ENCOUNTER — Telehealth: Payer: Self-pay | Admitting: Cardiovascular Disease

## 2017-12-10 NOTE — Telephone Encounter (Signed)
Pt returning our call about stress echo results  Please call back

## 2017-12-10 NOTE — Telephone Encounter (Signed)
-----   Message from Antonieta Iba, MD sent at 12/04/2017  1:08 PM EST ----- Echocardiogram Normal LV function, normal valves Normal pressures Overall excellent study

## 2017-12-10 NOTE — Telephone Encounter (Signed)
-----   Message from Antonieta Iba, MD sent at 12/04/2017  1:09 PM EST ----- Exercise treadmill study Good exercise tolerance No EKG changes concerning for blockages Good echocardiogram result, normal wall motion Overall normal study

## 2017-12-10 NOTE — Telephone Encounter (Signed)
Informed patient of results and verbal understanding expressed.  

## 2019-09-07 ENCOUNTER — Emergency Department: Payer: Managed Care, Other (non HMO)

## 2019-09-07 ENCOUNTER — Encounter: Payer: Self-pay | Admitting: Emergency Medicine

## 2019-09-07 ENCOUNTER — Emergency Department
Admission: EM | Admit: 2019-09-07 | Discharge: 2019-09-07 | Disposition: A | Discharge status: Home / Self Care | Payer: Managed Care, Other (non HMO) | Attending: Emergency Medicine | Admitting: Emergency Medicine

## 2019-09-07 ENCOUNTER — Other Ambulatory Visit: Payer: Self-pay

## 2019-09-07 DIAGNOSIS — Y92002 Bathroom of unspecified non-institutional (private) residence single-family (private) house as the place of occurrence of the external cause: Secondary | ICD-10-CM | POA: Insufficient documentation

## 2019-09-07 DIAGNOSIS — Y999 Unspecified external cause status: Secondary | ICD-10-CM | POA: Insufficient documentation

## 2019-09-07 DIAGNOSIS — Y93E1 Activity, personal bathing and showering: Secondary | ICD-10-CM | POA: Insufficient documentation

## 2019-09-07 DIAGNOSIS — S93602A Unspecified sprain of left foot, initial encounter: Secondary | ICD-10-CM | POA: Insufficient documentation

## 2019-09-07 DIAGNOSIS — F17210 Nicotine dependence, cigarettes, uncomplicated: Secondary | ICD-10-CM | POA: Diagnosis not present

## 2019-09-07 DIAGNOSIS — W182XXA Fall in (into) shower or empty bathtub, initial encounter: Secondary | ICD-10-CM | POA: Diagnosis not present

## 2019-09-07 DIAGNOSIS — Z79899 Other long term (current) drug therapy: Secondary | ICD-10-CM | POA: Insufficient documentation

## 2019-09-07 DIAGNOSIS — S99922A Unspecified injury of left foot, initial encounter: Secondary | ICD-10-CM | POA: Diagnosis present

## 2019-09-07 MED ORDER — IBUPROFEN 600 MG PO TABS: 600.0000 mg | ORAL_TABLET | Freq: Four times a day (QID) | ORAL | 0 refills | Status: DC | PRN

## 2019-09-07 NOTE — Discharge Instructions (Signed)
There is no fracture on your x-ray.  Please ice and elevate foot today.  You can use Ace wrap and crutches.  You can take ibuprofen for pain and inflammation.  Please follow-up with podiatry as needed.

## 2019-09-07 NOTE — ED Provider Notes (Signed)
[facility] Emergency Department Provider Note  ____________________________________________  Time seen: Approximately 1:08 PM  I have reviewed the triage vital signs and the nursing notes.   HISTORY  Chief Complaint Fall    HPI Virginia Walls is a 38 y.o. female that presents to the emergency department for evaluation of fall today. Patient was stepping into the bathtub today when she did a splits like maneuver because on of there feet slipped. She is currently having pain to the medial side of her left foot. She does not feel that anything is broken. No additional injuries.    Past Medical History:  Diagnosis Date  . Back pain   . Depression   . Migraine   . Palpitation   . Panic disorder   . RLS (restless legs syndrome)     Patient Active Problem List   Diagnosis Date Noted  . Chest tightness 11/13/2017  . Anxiety 11/13/2017  . Chest pain with moderate risk for cardiac etiology 11/12/2017  . Palpitations 11/12/2017  . Shortness of breath 11/12/2017  . Chronic constipation 08/12/2017  . Hyperglycemia 05/04/2015  . Tattoos 05/04/2015  . Headache, migraine 04/29/2015  . Excess weight 04/29/2015  . Restless leg 04/29/2015  . Snores 04/29/2015  . Vitamin D deficiency 04/29/2015    Past Surgical History:  Procedure Laterality Date  . ABDOMINAL HYSTERECTOMY    . TUBAL LIGATION      Prior to Admission medications   Medication Sig Start Date End Date Taking? Authorizing Provider  ibuprofen (ADVIL) 600 MG tablet Take 1 tablet (600 mg total) by mouth every 6 (six) hours as needed. 09/07/19   Enid Derry, PA-C  topiramate (TOPAMAX) 50 MG tablet Take 0.5-2 tablets (25-100 mg total) 2 (two) times daily by mouth. Max of 100  Mg twice daily 08/27/17   Alba Cory, MD  Vitamin D, Ergocalciferol, (DRISDOL) 50000 units CAPS capsule Take 1 capsule (50,000 Units total) by mouth every 7 (seven) days. 07/26/17   Alba Cory, MD     Allergies Patient has no known allergies.  Family History  Problem Relation Age of Onset  . Heart disease Mother   . Asthma Mother   . Hypothyroidism Mother   . Hypothyroidism Sister   . Diabetes Sister   . Hyperlipidemia Sister   . Asthma Son   . Heart disease Paternal Grandmother        Pacemaker  . Leukemia Paternal Grandmother   . Diabetes Paternal Grandmother     Social History Social History   Tobacco Use  . Smoking status: Current Some Day Smoker    Packs/day: 0.25    Years: 4.00    Pack years: 1.00    Types: Cigarettes    Start date: 05/04/2011  . Smokeless tobacco: Never Used  Substance Use Topics  . Alcohol use: Yes    Alcohol/week: 0.0 standard drinks    Comment: occasionally  . Drug use: No     Review of Systems  Cardiovascular: No chest pain. Respiratory: No SOB. Gastrointestinal: No abdominal pain.  No nausea, no vomiting.  Musculoskeletal: Positive for foot pain. Skin: Negative for rash, abrasions, lacerations, ecchymosis. Neurological: Negative for headaches, numbness or tingling   ____________________________________________   PHYSICAL EXAM:  VITAL SIGNS: ED Triage Vitals  Enc Vitals Group     BP 09/07/19 1219 (!) 140/96     Pulse Rate 09/07/19 1219 78     Resp 09/07/19 1219 16     Temp 09/07/19 1219 98.8 F (37.1  C)     Temp Source 09/07/19 1219 Oral     SpO2 09/07/19 1219 99 %     Weight --      Height --      Head Circumference --      Peak Flow --      Pain Score 09/07/19 1218 8     Pain Loc --      Pain Edu? --      Excl. in GC? --      Constitutional: Alert and oriented. Well appearing and in no acute distress. Eyes: Conjunctivae are normal. PERRL. EOMI. Head: Atraumatic. ENT:      Ears:      Nose: No congestion/rhinnorhea.      Mouth/Throat: Mucous membranes are moist.  Neck: No stridor. Cardiovascular: Normal rate, regular rhythm.  Good peripheral circulation. Symmetric pedal pulses  bilaterally. Respiratory: Normal respiratory effort without tachypnea or retractions. Lungs CTAB. Good air entry to the bases with no decreased or absent breath sounds. Musculoskeletal: Full range of motion to all extremities. No gross deformities appreciated. No tenderness over lateral or medial malleolus. Tenderness to palpation to medial foot.  Neurologic:  Normal speech and language. No gross focal neurologic deficits are appreciated.  Skin:  Skin is warm, dry and intact. No rash noted. Psychiatric: Mood and affect are normal. Speech and behavior are normal. Patient exhibits appropriate insight and judgement.   ____________________________________________   LABS (all labs ordered are listed, but only abnormal results are displayed)  Labs Reviewed - No data to display ____________________________________________  EKG   ____________________________________________  RADIOLOGY Lexine Baton, personally viewed and evaluated these images (plain radiographs) as part of my medical decision making, as well as reviewing the written report by the radiologist.  Dg Foot Complete Left  Result Date: 09/07/2019 CLINICAL DATA:  Pain following fall EXAM: LEFT FOOT - COMPLETE 3+ VIEW COMPARISON:  None. FINDINGS: Frontal, oblique, and lateral views were obtained. There is no appreciable fracture or dislocation. Joint spaces appear normal. No erosive change. There is a small accessory ossicle lateral to the cuboid. IMPRESSION: No fracture or dislocation.  No evident arthropathy. Electronically Signed   By: Bretta Bang III M.D.   On: 09/07/2019 13:24    ____________________________________________    PROCEDURES  Procedure(s) performed:    Procedures    Medications - No data to display   ____________________________________________   INITIAL IMPRESSION / ASSESSMENT AND PLAN / ED COURSE  Pertinent labs & imaging results that were available during my care of the patient were  reviewed by me and considered in my medical decision making (see chart for details).  Review of the [place] CSRS was performed in accordance of the [facility] prior to dispensing any controlled drugs.    Patient's diagnosis is consistent with foot sprain.  Vital signs and exam are reassuring.  No fracture on x-ray.  Ace wrap was placed and postop shoe was given.  Crutches were provided.  Patient will be discharged home with prescriptions for Motrin.  Patient is to follow up with podiatry as directed. Patient is given ED precautions to return to the ED for any worsening or new symptoms.  Virginia Walls was evaluated in Emergency Department on 09/07/2019 for the symptoms described in the history of present illness. She was evaluated in the context of the global COVID-19 pandemic, which necessitated consideration that the patient might be at risk for infection with the SARS-CoV-2 virus that causes COVID-19. Institutional protocols and algorithms that pertain to  the evaluation of patients at risk for COVID-19 are in a state of rapid change based on information released by regulatory bodies including the CDC and federal and state organizations. These policies and algorithms were followed during the patient's care in the ED.   ____________________________________________  FINAL CLINICAL IMPRESSION(S) / ED DIAGNOSES  Final diagnoses:  Sprain of left foot, initial encounter      NEW MEDICATIONS STARTED DURING THIS VISIT:  ED Discharge Orders         Ordered    ibuprofen (ADVIL) 600 MG tablet  Every 6 hours PRN     09/07/19 1352              This chart was dictated using voice recognition software/Dragon. Despite best efforts to proofread, errors can occur which can change the meaning. Any change was purely unintentional.    Laban Emperor, PA-C 09/07/19 1504    Vanessa , MD 09/08/19 1226

## 2019-09-07 NOTE — ED Notes (Signed)
See triage note  Presents s/p fall   States she fell in shower this am  Having pain across the top of foot  Good pulses  Unable to bear full wt

## 2019-09-07 NOTE — ED Triage Notes (Signed)
PT had mechanical fall today in shower, pt c/o LFT sided pain from fall on side. brusing noted to LFT knee and foot. No deformity noted

## 2020-02-02 ENCOUNTER — Other Ambulatory Visit: Payer: Self-pay

## 2020-02-02 ENCOUNTER — Encounter: Payer: Self-pay | Admitting: Family Medicine

## 2020-02-02 ENCOUNTER — Ambulatory Visit: Payer: Managed Care, Other (non HMO) | Admitting: Family Medicine

## 2020-02-02 VITALS — BP 120/80 | HR 89 | Temp 97.3°F | Resp 16 | Ht 64.0 in | Wt 170.2 lb

## 2020-02-02 DIAGNOSIS — F4321 Adjustment disorder with depressed mood: Secondary | ICD-10-CM

## 2020-02-02 DIAGNOSIS — G43009 Migraine without aura, not intractable, without status migrainosus: Secondary | ICD-10-CM

## 2020-02-02 DIAGNOSIS — E559 Vitamin D deficiency, unspecified: Secondary | ICD-10-CM | POA: Diagnosis not present

## 2020-02-02 DIAGNOSIS — F322 Major depressive disorder, single episode, severe without psychotic features: Secondary | ICD-10-CM

## 2020-02-02 DIAGNOSIS — Z634 Disappearance and death of family member: Secondary | ICD-10-CM

## 2020-02-02 DIAGNOSIS — M25571 Pain in right ankle and joints of right foot: Secondary | ICD-10-CM

## 2020-02-02 DIAGNOSIS — G8929 Other chronic pain: Secondary | ICD-10-CM

## 2020-02-02 MED ORDER — TOPIRAMATE 50 MG PO TABS: 25.0000 mg | ORAL_TABLET | Freq: Two times a day (BID) | ORAL | 0 refills | Status: DC

## 2020-02-02 MED ORDER — ESCITALOPRAM OXALATE 10 MG PO TABS: 10.0000 mg | ORAL_TABLET | Freq: Every day | ORAL | 0 refills | Status: DC

## 2020-02-02 MED ORDER — MELOXICAM 15 MG PO TABS: 15.0000 mg | ORAL_TABLET | Freq: Every day | ORAL | 0 refills | Status: DC

## 2020-02-02 MED ORDER — VITAMIN D (ERGOCALCIFEROL) 1.25 MG (50000 UNIT) PO CAPS: 50000.0000 [IU] | ORAL_CAPSULE | ORAL | 0 refills | Status: DC

## 2020-02-02 NOTE — Progress Notes (Signed)
Name: Virginia Walls   MRN: [id number]    DOB: [birth year]   Date:02/02/2020       Progress Note  Subjective  Chief Complaint  Chief Complaint  Patient presents with  . Grief    Loss her son on 01/26/2020. She wants to discuss coping options.  . Migraine    She needs a refill on her Topamax. Her migraines have increased.  . Ankle Pain    She has bilateral ankle pain x 1 year. Worse in her right ankle. Uncomfortable to walk and swells at times.    HPI  Chronic right ankle pain: she was seen by Dr. Jacqualyn Posey 01/2016 with acute onset of right ankle pain, she was given nsaid's and symptoms persisted had MRI done and found to have tendinopathy and plantar fascitis. She wore a boot and symptoms improved however over the past 6 months she noticed pain and swelling with activity. It is getting progressively worse, affecting her gait. Pain is 8/10 described as aching and throbbing.   Migraine: she states pain can start on nuchal area or frontal area and radiates to opposite direction. Described as pounding, sharp associated with dizziness, photophobia, no nausea or vomiting. Taking Excedrin migraine prn but does not work. No nausea or vomiting.   Vitamin D def: she is off supplements, we will send refill of rx  Grief: remote history of depression used to see Dr. Grandville Silos and took medications in the past but not sure of the name. She states episode was never like this. Her son was diagnosed with leukemia at age 21 back in Dec, was transferred to [facility] and stayed there until he died on 2020/01/26. She has been on FMLA. She states for the past month she has been crying daily, poor appetite, cannot sleep at night, has suicidal thoughts but no planning.  She brought FMLA forms to be filled out, she is willing to try medication and see psychiatrist, she will also contact Dr. Grandville Silos   Patient Active Problem List   Diagnosis Date Noted  . Chest tightness 11/13/2017  . Anxiety 11/13/2017  . Chest pain with  moderate risk for cardiac etiology 11/12/2017  . Palpitations 11/12/2017  . Shortness of breath 11/12/2017  . Chronic constipation 08/12/2017  . Hyperglycemia 05/04/2015  . Tattoos 05/04/2015  . Headache, migraine 04/29/2015  . Excess weight 04/29/2015  . Restless leg 04/29/2015  . Snores 04/29/2015  . Vitamin D deficiency 04/29/2015    Past Surgical History:  Procedure Laterality Date  . ABDOMINAL HYSTERECTOMY    . TUBAL LIGATION      Family History  Problem Relation Age of Onset  . Heart disease Mother   . Asthma Mother   . Hypothyroidism Mother   . Hypothyroidism Sister   . Diabetes Sister   . Hyperlipidemia Sister   . Asthma Son   . Heart disease Paternal Grandmother        Pacemaker  . Leukemia Paternal Grandmother   . Diabetes Paternal Grandmother     Social History   Tobacco Use  . Smoking status: Current Some Day Smoker    Packs/day: 0.25    Years: 4.00    Pack years: 1.00    Types: Cigarettes    Start date: 05/04/2011  . Smokeless tobacco: Never Used  Substance Use Topics  . Alcohol use: Yes    Alcohol/week: 0.0 standard drinks    Comment: occasionally     Current Outpatient Medications:  .  ibuprofen (ADVIL) 600 MG tablet, Take  1 tablet (600 mg total) by mouth every 6 (six) hours as needed., Disp: 30 tablet, Rfl: 0 .  topiramate (TOPAMAX) 50 MG tablet, Take 0.5-2 tablets (25-100 mg total) 2 (two) times daily by mouth. Max of 100  Mg twice daily, Disp: 100 tablet, Rfl: 0 .  Vitamin D, Ergocalciferol, (DRISDOL) 50000 units CAPS capsule, Take 1 capsule (50,000 Units total) by mouth every 7 (seven) days., Disp: 12 capsule, Rfl: 0  Allergies  Allergen Reactions  . Corylus   . Hydrocodone     I personally reviewed active problem list, medication list, allergies, family history, social history, health maintenance with the patient/caregiver today.   ROS  Ten systems reviewed and is negative except as mentioned in HPI   Objective  Vitals:    02/02/20 1103  BP: 120/80  Pulse: 89  Resp: 16  Temp: (!) 97.3 F (36.3 C)  TempSrc: Temporal  SpO2: 98%  Weight: 170 lb 3.2 oz (77.2 kg)  Height: 5\' 4"  (1.626 m)    Body mass index is 29.21 kg/m.  Physical Exam  Constitutional: Patient appears well-developed and well-nourished. Overweight. No distress.  HEENT: head atraumatic, normocephalic, pupils equal and reactive to light Cardiovascular: Normal rate, regular rhythm and normal heart sounds.  No murmur heard. No BLE edema. Pulmonary/Chest: Effort normal and breath sounds normal. No respiratory distress. Abdominal: Soft.  There is no tenderness. Psychiatric: Patient cried the entire visit, poor eye contact, sad but well dressed , cooperative and gave a good history   PHQ2/9: Depression screen [facility] 2/9 02/02/2020 08/27/2017 12/15/2015 10/20/2015 05/04/2015  Decreased Interest 2 0 0 0 1  Down, Depressed, Hopeless 3 0 0 0 1  PHQ - 2 Score 5 0 0 0 2  Altered sleeping 3 - - - 2  Tired, decreased energy 3 - - - 1  Change in appetite 3 - - - 2  Feeling bad or failure about yourself  3 - - - 1  Trouble concentrating 3 - - - 1  Moving slowly or fidgety/restless 3 - - - 0  Suicidal thoughts 1 - - - 0  PHQ-9 Score 24 - - - 9  Difficult doing work/chores Extremely dIfficult - - - Not difficult at all    phq 9 is positive   Fall Risk: Fall Risk  02/02/2020 08/27/2017 07/24/2017 12/15/2015 10/20/2015  Falls in the past year? 0 No No No No  Number falls in past yr: 0 - - - -  Injury with Fall? 0 - - - -     Functional Status Survey: Is the patient deaf or have difficulty hearing?: No Does the patient have difficulty seeing, even when wearing glasses/contacts?: No Does the patient have difficulty concentrating, remembering, or making decisions?: No Does the patient have difficulty walking or climbing stairs?: No Does the patient have difficulty dressing or bathing?: No Does the patient have difficulty doing errands alone such as  visiting a doctor's office or shopping?: No    Assessment & Plan  1. Chronic pain of right ankle  - Ambulatory referral to Podiatry - meloxicam (MOBIC) 15 MG tablet; Take 1 tablet (15 mg total) by mouth daily.  Dispense: 30 tablet; Refill: 0  2. Grief at loss of child  - escitalopram (LEXAPRO) 10 MG tablet; Take 1 tablet (10 mg total) by mouth daily.  Dispense: 30 tablet; Refill: 0 - Ambulatory referral to Psychiatry  3. Migraine without aura and without status migrainosus, not intractable  - topiramate (TOPAMAX) 50 MG tablet;  Take 0.5-2 tablets (25-100 mg total) by mouth 2 (two) times daily. Max of 100  Mg twice daily  Dispense: 100 tablet; Refill: 0  4. Vitamin D deficiency  - Vitamin D, Ergocalciferol, (DRISDOL) 1.25 MG (50000 UNIT) CAPS capsule; Take 1 capsule (50,000 Units total) by mouth every 7 (seven) days.  Dispense: 12 capsule; Refill: 0 - Ambulatory referral to Psychiatry  5. Current severe episode of major depressive disorder without psychotic features without prior episode (HCC)  - escitalopram (LEXAPRO) 10 MG tablet; Take 1 tablet (10 mg total) by mouth daily.  Dispense: 30 tablet; Refill: 0 - Ambulatory referral to Psychiatry

## 2020-02-22 ENCOUNTER — Other Ambulatory Visit: Payer: Self-pay

## 2020-02-22 ENCOUNTER — Other Ambulatory Visit: Payer: Self-pay | Admitting: Family Medicine

## 2020-02-22 ENCOUNTER — Ambulatory Visit: Payer: Managed Care, Other (non HMO) | Admitting: Podiatry

## 2020-02-22 ENCOUNTER — Ambulatory Visit (INDEPENDENT_AMBULATORY_CARE_PROVIDER_SITE_OTHER): Payer: Managed Care, Other (non HMO)

## 2020-02-22 ENCOUNTER — Other Ambulatory Visit: Payer: Self-pay | Admitting: Podiatry

## 2020-02-22 DIAGNOSIS — M7661 Achilles tendinitis, right leg: Secondary | ICD-10-CM | POA: Diagnosis not present

## 2020-02-22 DIAGNOSIS — G8929 Other chronic pain: Secondary | ICD-10-CM | POA: Diagnosis not present

## 2020-02-22 DIAGNOSIS — M25571 Pain in right ankle and joints of right foot: Secondary | ICD-10-CM | POA: Diagnosis not present

## 2020-02-22 DIAGNOSIS — G43009 Migraine without aura, not intractable, without status migrainosus: Secondary | ICD-10-CM

## 2020-02-22 MED ORDER — MELOXICAM 15 MG PO TABS: 15.0000 mg | ORAL_TABLET | Freq: Every day | ORAL | 1 refills | Status: DC

## 2020-02-22 NOTE — Telephone Encounter (Signed)
Requested medications are due for refill today?  Yes - This refill cannot be delegated.    Requested medications are on active medication list?  Yes  Last Refill:  02/02/2020  # 100 with no refills    Future visit scheduled?  Yes  Notes to Clinic:  This refill cannot be delegated.  Note:  Last labs perfomred on 07/24/2017.

## 2020-02-22 NOTE — Telephone Encounter (Signed)
Requested medications are due for refill today?  Yes - This refill cannot be delegated.    Requested medications are on active medication list?  Yes  Last Refill:         02/02/2020  # 100 with no refills    Future visit scheduled?  Yes  Notes to Clinic:  This refill cannot be delegated.  (Note:  Last labs performed on 07/24/2017.)

## 2020-02-23 NOTE — Telephone Encounter (Signed)
Called patient to ask her the dose of Topamax she was taking and there was no answer. Left vm. This is for a refill request and PCP wants to know what dose she is taking, because she was suppose to titrate up.

## 2020-02-23 NOTE — Telephone Encounter (Signed)
Called patient and she is taking 2 times daily. Max 100 mg per day.

## 2020-02-27 ENCOUNTER — Other Ambulatory Visit: Payer: Self-pay | Admitting: Family Medicine

## 2020-02-27 DIAGNOSIS — F4321 Adjustment disorder with depressed mood: Secondary | ICD-10-CM

## 2020-02-27 DIAGNOSIS — F322 Major depressive disorder, single episode, severe without psychotic features: Secondary | ICD-10-CM

## 2020-02-27 DIAGNOSIS — G8929 Other chronic pain: Secondary | ICD-10-CM

## 2020-02-27 DIAGNOSIS — Z634 Disappearance and death of family member: Secondary | ICD-10-CM

## 2020-02-27 NOTE — Telephone Encounter (Signed)
Requested Prescriptions  Pending Prescriptions Disp Refills  . escitalopram (LEXAPRO) 10 MG tablet [Pharmacy Med Name: ESCITALOPRAM 10 MG TABLET] 30 tablet 0    Sig: TAKE 1 TABLET BY MOUTH EVERY DAY     Psychiatry:  Antidepressants - SSRI Passed - 02/27/2020 12:50 AM      Passed - Valid encounter within last 6 months    Recent Outpatient Visits          3 weeks ago Chronic pain of right ankle   [facility] Alba Cory, MD   2 years ago Vaginitis and vulvovaginitis   [facility], Danna Hefty, MD   2 years ago Well woman exam   [facility] Alba Cory, MD   2 years ago Chest pain, unspecified type   [facility], [facility]   2 years ago Vaginal discharge   [facility] Alba Cory, MD      Future Appointments            In 2 weeks Alba Cory, MD [facility], [facility]           . meloxicam (MOBIC) 15 MG tablet [Pharmacy Med Name: MELOXICAM 15 MG TABLET] 30 tablet 1    Sig: TAKE 1 TABLET BY MOUTH EVERY DAY     Analgesics:  COX2 Inhibitors Failed - 02/27/2020 12:50 AM      Failed - HGB in normal range and within 360 days    Hemoglobin  Date Value Ref Range Status  07/24/2017 12.9 11.7 - 15.5 g/dL Final   HGB  Date Value Ref Range Status  08/20/2013 13.5 12.0 - 16.0 g/dL Final         Failed - Cr in normal range and within 360 days    Creat  Date Value Ref Range Status  07/24/2017 0.74 0.50 - 1.10 mg/dL Final         Passed - Patient is not pregnant      Passed - Valid encounter within last 12 months    Recent Outpatient Visits          3 weeks ago Chronic pain of right ankle   [facility] Alba Cory, MD   2 years ago Vaginitis and vulvovaginitis   [facility] Alba Cory, MD   2 years ago Well woman exam   [facility], MD   2 years ago Chest  pain, unspecified type   [facility] Alba Cory, MD   2 years ago Vaginal discharge   [facility] Alba Cory, MD      Future Appointments            In 2 weeks Alba Cory, MD [facility], [facility]

## 2020-02-29 NOTE — Progress Notes (Signed)
   HPI: 39 y.o. female presenting today as a new patient with a chief complaint of sharp, throbbing, burning pain to the posterior right heel that began about 6 months ago. She reports associated swelling. She states the pain radiates to the medial ankle. Walking increases the pain. She has been taking Meloxicam, using an Ace wrap, elevating and icing the foot. Patient is here for further evaluation and treatment.   Past Medical History:  Diagnosis Date  . Back pain   . Depression   . Migraine   . Palpitation   . Panic disorder   . RLS (restless legs syndrome)       Physical Exam: General: The patient is alert and oriented x3 in no acute distress.  Dermatology: Skin is warm, dry and supple bilateral lower extremities. Negative for open lesions or macerations.  Vascular: Palpable pedal pulses bilaterally. No edema or erythema noted. Capillary refill within normal limits.  Neurological: Epicritic and protective threshold grossly intact bilaterally.   Musculoskeletal Exam: Pain on palpation noted to the posterior tubercle of the right calcaneus at the insertion of the Achilles tendon consistent with retrocalcaneal bursitis. Range of motion within normal limits. Muscle strength 5/5 in all muscle groups bilateral lower extremities.  Radiographic Exam:  Posterior calcaneal spur noted to the respective calcaneus on lateral view. No fracture or dislocation noted. Normal osseous mineralization noted.     Assessment: 1. Insertional Achilles tendinitis right  2. Retrocalcaneal bursitis   Plan of Care:  1. Patient was evaluated. Radiographs were reviewed today. 2. Injection of 0.5 mL Celestone Soluspan injected into the retrocalcaneal bursa. Care was taken to avoid direct injection into the Achilles tendon. 3. CAM boot dispensed.  4. Ace wraps dispensed.  5. Refill prescription for Meloxicam provided to patient.  6. Return to clinic in 4 weeks.   84 year old son passed away from  Leukemia in Feb 26, 2020.    Felecia Shelling, DPM [facility]  Dr. Felecia Shelling, DPM    [address]                                        [place], [place] [address]                Office [phone]  Fax [phone]

## 2020-03-15 ENCOUNTER — Ambulatory Visit: Payer: Managed Care, Other (non HMO) | Admitting: Family Medicine

## 2020-03-15 ENCOUNTER — Other Ambulatory Visit: Payer: Self-pay

## 2020-03-15 ENCOUNTER — Encounter: Payer: Self-pay | Admitting: Family Medicine

## 2020-03-15 VITALS — BP 110/80 | HR 99 | Temp 97.1°F | Resp 16 | Ht 64.0 in | Wt 164.5 lb

## 2020-03-15 DIAGNOSIS — F333 Major depressive disorder, recurrent, severe with psychotic symptoms: Secondary | ICD-10-CM

## 2020-03-15 DIAGNOSIS — F4321 Adjustment disorder with depressed mood: Secondary | ICD-10-CM

## 2020-03-15 DIAGNOSIS — Z634 Disappearance and death of family member: Secondary | ICD-10-CM

## 2020-03-15 MED ORDER — ARIPIPRAZOLE 5 MG PO TABS: 5.0000 mg | ORAL_TABLET | Freq: Every day | ORAL | 0 refills | Status: DC

## 2020-03-15 MED ORDER — ESCITALOPRAM OXALATE 10 MG PO TABS: 5.0000 mg | ORAL_TABLET | Freq: Every day | ORAL | 0 refills | Status: DC

## 2020-03-15 NOTE — Progress Notes (Signed)
Name: Virginia Walls   MRN: [id number]    DOB: 1980-11-18   Date:03/15/2020       Progress Note  Subjective  Chief Complaint  Chief Complaint  Patient presents with  . Grief    She continues to have issues with grief. Not sleeping, starting to see things more frequently in the past two weeks. Has a lot more added stress. Another death in her family and moving. She has increased agitation and withdrawl. No interest or pleasure in doing things. More angry at husband or child's father. Being around her other kids is hard, especially her middle child. She is scheduled to see psychologist next month.    HPI  Grief/Major depression recurrent: she states first episode of depression was after the birth of her second son she was given medications but was not compliant with it, she states she never felt back to normal, she states keeps things to herself and cannot handle stress well, she states she was depressed before her son got the diagnosis of leukemia back in Dec 2020 , but it got progressively worse as he got sicker and died on Feb 08, 2020  . She  used to see Dr. Janee Morn and therapy helped in the past but not seen by him in a while.  She states episode was never like this.  She has been on FMLA since [facility] was [facility] to [facility] . She was seen in April 2021  and had been  crying daily for the previous month. She was having  poor appetite, could not  sleep at night, had suicidal thoughts but no planning. We started her on Lexapro and she has been compliant with medication but symptoms are not improving, she had another death in her family - one of her cousins. She is more agitated, withdrawn, gets upset when around her son and also her partner, she is now having visual hallucinations described as shadows that is getting progressively worse. She states she has not been sleeping - stays up until 4 am even when she gets up around 8 am, feeling more agitated . She was advised to follow up with therapist and  also see psychiatrist. She tried to contact Dr. Janee Morn ( her previous therapist) and he was on leave, she made an appointment with a psychiatrist ,Dr. Elna Breslow on 04/11/2020.   Patient Active Problem List   Diagnosis Date Noted  . Anxiety 11/13/2017  . Chronic constipation 08/12/2017  . Hyperglycemia 05/04/2015  . Tattoos 05/04/2015  . Headache, migraine 04/29/2015  . Excess weight 04/29/2015  . Restless leg 04/29/2015  . Snores 04/29/2015  . Vitamin D deficiency 04/29/2015    Past Surgical History:  Procedure Laterality Date  . ABDOMINAL HYSTERECTOMY    . TUBAL LIGATION      Family History  Problem Relation Age of Onset  . Heart disease Mother   . Asthma Mother   . Hypothyroidism Mother   . Hypothyroidism Sister   . Diabetes Sister   . Hyperlipidemia Sister   . Asthma Son   . Heart disease Paternal Grandmother        Pacemaker  . Leukemia Paternal Grandmother   . Diabetes Paternal Grandmother     Social History   Tobacco Use  . Smoking status: Current Some Day Smoker    Packs/day: 0.25    Years: 4.00    Pack years: 1.00    Types: Cigarettes    Start date: 05/04/2011  . Smokeless tobacco: Never Used  Substance Use  Topics  . Alcohol use: Yes    Alcohol/week: 0.0 standard drinks    Comment: occasionally     Current Outpatient Medications:  .  escitalopram (LEXAPRO) 10 MG tablet, TAKE 1 TABLET BY MOUTH EVERY DAY, Disp: 30 tablet, Rfl: 0 .  meloxicam (MOBIC) 15 MG tablet, Take 1 tablet (15 mg total) by mouth daily., Disp: 30 tablet, Rfl: 1 .  topiramate (TOPAMAX) 50 MG tablet, Take 1 tablet (50 mg total) by mouth 2 (two) times daily. Max of 100  Mg twice daily, Disp: 180 tablet, Rfl: 0 .  Vitamin D, Ergocalciferol, (DRISDOL) 1.25 MG (50000 UNIT) CAPS capsule, Take 1 capsule (50,000 Units total) by mouth every 7 (seven) days., Disp: 12 capsule, Rfl: 0  Allergies  Allergen Reactions  . Corylus   . Hydrocodone     I personally reviewed active problem list,  medication list, allergies, family history, social history, health maintenance with the patient/caregiver today.   ROS  Ten systems reviewed and is negative except as mentioned in HPI   Objective  Vitals:   03/15/20 1100  BP: 110/80  Pulse: 99  Resp: 16  Temp: (!) 97.1 F (36.2 C)  TempSrc: Temporal  SpO2: 100%  Weight: 164 lb 8 oz (74.6 kg)  Height: 5\' 4"  (1.626 m)    Body mass index is 28.24 kg/m.  Physical Exam  Constitutional: Patient appears well-developed and well-nourished. Overweight. No distress.  HEENT: head atraumatic, normocephalic, pupils equal and reactive to light,  neck supple Cardiovascular: Normal rate, regular rhythm and normal heart sounds.  No murmur heard. No BLE edema. Pulmonary/Chest: Effort normal and breath sounds normal. No respiratory distress. Abdominal: Soft.  There is no tenderness. Psychiatric: Patient has a normal mood and affect, got teary during exam. behavior is normal. Judgment and thought content normal.   PHQ2/9: Depression screen [facility] 2/9 03/15/2020 02/02/2020 08/27/2017 12/15/2015 10/20/2015  Decreased Interest 3 2 0 0 0  Down, Depressed, Hopeless 3 3 0 0 0  PHQ - 2 Score 6 5 0 0 0  Altered sleeping 3 3 - - -  Tired, decreased energy 3 3 - - -  Change in appetite 3 3 - - -  Feeling bad or failure about yourself  3 3 - - -  Trouble concentrating 3 3 - - -  Moving slowly or fidgety/restless 3 3 - - -  Suicidal thoughts 2 1 - - -  PHQ-9 Score 26 24 - - -  Difficult doing work/chores Extremely dIfficult Extremely dIfficult - - -    phq 9 is positive   Fall Risk: Fall Risk  03/15/2020 02/02/2020 08/27/2017 07/24/2017 12/15/2015  Falls in the past year? 0 0 No No No  Number falls in past yr: 0 0 - - -  Injury with Fall? 0 0 - - -     Functional Status Survey: Is the patient deaf or have difficulty hearing?: No Does the patient have difficulty seeing, even when wearing glasses/contacts?: No Does the patient have difficulty  concentrating, remembering, or making decisions?: No Does the patient have difficulty walking or climbing stairs?: Yes Does the patient have difficulty dressing or bathing?: No Does the patient have difficulty doing errands alone such as visiting a doctor's office or shopping?: No   Assessment & Plan  1. Grief at loss of child  Has follow up with Dr. 12/17/2015   2. Severe episode of recurrent major depressive disorder, with psychotic features [facility])  She still has lexapro, worried she may  have bipolar She states she would not kill herself because she knows it would be devastating to her children  - ARIPiprazole (ABILIFY) 5 MG tablet; Take 1 tablet (5 mg total) by mouth daily.  Dispense: 30 tablet; Refill: 0 Cut down lexapro to half dose   Advised to contact insurance to get counseling sooner , she states her insurance offers that

## 2020-03-24 ENCOUNTER — Other Ambulatory Visit: Payer: Self-pay | Admitting: Family Medicine

## 2020-03-24 ENCOUNTER — Ambulatory Visit: Payer: Managed Care, Other (non HMO) | Admitting: Podiatry

## 2020-03-24 DIAGNOSIS — G43009 Migraine without aura, not intractable, without status migrainosus: Secondary | ICD-10-CM

## 2020-03-24 NOTE — Telephone Encounter (Signed)
Requested medication (s) are due for refill today -yes  Requested medication (s) are on the active medication list -yes  Future visit scheduled -yes  Last refill: 02/23/20  Notes to clinic: Request for non delegated Rx  Requested Prescriptions  Pending Prescriptions Disp Refills   topiramate (TOPAMAX) 50 MG tablet [Pharmacy Med Name: TOPIRAMATE 50 MG TABLET] 60 tablet 2    Sig: Take 1 tablet (50 mg total) by mouth 2 (two) times daily. Max of 100  Mg twice daily      Not Delegated - Neurology: Anticonvulsants - topiramate & zonisamide Failed - 03/24/2020 12:32 PM      Failed - This refill cannot be delegated      Failed - Cr in normal range and within 360 days    Creat  Date Value Ref Range Status  07/24/2017 0.74 0.50 - 1.10 mg/dL Final          Failed - CO2 in normal range and within 360 days    CO2  Date Value Ref Range Status  07/24/2017 29 20 - 32 mmol/L Final   Co2  Date Value Ref Range Status  08/20/2013 28 21 - 32 mmol/L Final          Passed - Valid encounter within last 12 months    Recent Outpatient Visits           1 week ago Grief at loss of child   [facility], Danna Hefty, MD   1 month ago Chronic pain of right ankle   [facility], MD   2 years ago Vaginitis and vulvovaginitis   [facility] Al[facility], MD   2 years ago Well woman exam   [facility] Al[facility], MD   2 years ago Chest pain, unspecified type   [facility] Alba Cory, MD       Future Appointments             In 2 months Alba Cory, MD [facility]                Requested Prescriptions  Pending Prescriptions Disp Refills   topiramate (TOPAMAX) 50 MG tablet [Pharmacy Med Name: TOPIRAMATE 50 MG TABLET] 60 tablet 2    Sig: Take 1 tablet (50 mg total) by mouth 2 (two) times daily. Max of 100  Mg twice daily      Not  Delegated - Neurology: Anticonvulsants - topiramate & zonisamide Failed - 03/24/2020 12:32 PM      Failed - This refill cannot be delegated      Failed - Cr in normal range and within 360 days    Creat  Date Value Ref Range Status  07/24/2017 0.74 0.50 - 1.10 mg/dL Final          Failed - CO2 in normal range and within 360 days    CO2  Date Value Ref Range Status  07/24/2017 29 20 - 32 mmol/L Final   Co2  Date Value Ref Range Status  08/20/2013 28 21 - 32 mmol/L Final          Passed - Valid encounter within last 12 months    Recent Outpatient Visits           1 week ago Grief at loss of child   [facility], Danna Hefty, MD   1 month ago Chronic pain of right ankle   [facility] Steele Sizer, MD   2 years ago Vaginitis and vulvovaginitis   [facility] Steele Sizer, MD   2 years ago Well woman exam   [facility] Steele Sizer, MD   2 years ago Chest pain, unspecified type   [facility] Steele Sizer, MD       Future Appointments             In 2 months Ancil Boozer, Drue Stager, MD [facility], [facility]

## 2020-03-28 ENCOUNTER — Telehealth: Payer: Self-pay

## 2020-03-28 NOTE — Telephone Encounter (Signed)
Copied from CRM [id number]. Topic: General - Other >> Mar 28, 2020 11:54 AM Wyonia Hough E wrote: Reason for CRM: they received Fl2 paperwork but it is not signed or dated /the form that was faxed to them on June 4th / if it can be signed and dated and re faxed back [phone]   Forms had signature on them but no date. Forms have been re-faxed today.

## 2020-04-04 ENCOUNTER — Other Ambulatory Visit: Payer: Self-pay

## 2020-04-04 DIAGNOSIS — E559 Vitamin D deficiency, unspecified: Secondary | ICD-10-CM

## 2020-04-04 MED ORDER — VITAMIN D (ERGOCALCIFEROL) 1.25 MG (50000 UNIT) PO CAPS: 50000.0000 [IU] | ORAL_CAPSULE | ORAL | 0 refills | Status: DC

## 2020-04-06 ENCOUNTER — Other Ambulatory Visit: Payer: Self-pay | Admitting: Family Medicine

## 2020-04-06 DIAGNOSIS — F333 Major depressive disorder, recurrent, severe with psychotic symptoms: Secondary | ICD-10-CM

## 2020-04-06 DIAGNOSIS — E559 Vitamin D deficiency, unspecified: Secondary | ICD-10-CM

## 2020-04-06 NOTE — Telephone Encounter (Signed)
    Notes to clinic:   comment: REQUEST FOR 90 DAYS PRESCRIPTION. DX Code Needed.  Requested Prescriptions  Pending Prescriptions Disp Refills   ARIPiprazole (ABILIFY) 5 MG tablet [Pharmacy Med Name: ARIPIPRAZOLE 5 MG TABLET] 90 tablet 1    Sig: TAKE 1 TABLET BY MOUTH EVERY DAY      Not Delegated - Psychiatry:  Antipsychotics - Second Generation (Atypical) - aripiprazole Failed - 04/06/2020  1:35 PM      Failed - This refill cannot be delegated      Passed - Valid encounter within last 6 months    Recent Outpatient Visits           3 weeks ago Grief at loss of child   [facility] Alba Cory, MD   2 months ago Chronic pain of right ankle   [facility] Alba Cory, MD   2 years ago Vaginitis and vulvovaginitis   [facility] Al[facility], MD   2 years ago Well woman exam   [facility] Alba Cory, MD   2 years ago Chest pain, unspecified type   [facility] Alba Cory, MD       Future Appointments             In 2 months Carlynn Purl, Danna Hefty, MD [facility], [facility]

## 2020-04-07 ENCOUNTER — Other Ambulatory Visit: Payer: Self-pay

## 2020-04-10 ENCOUNTER — Other Ambulatory Visit: Payer: Self-pay | Admitting: Family Medicine

## 2020-04-10 DIAGNOSIS — F333 Major depressive disorder, recurrent, severe with psychotic symptoms: Secondary | ICD-10-CM

## 2020-04-10 DIAGNOSIS — G8929 Other chronic pain: Secondary | ICD-10-CM

## 2020-04-10 NOTE — Telephone Encounter (Signed)
Requested medication (s) are due for refill today: Yes  Requested medication (s) are on the active medication list: Yes  Last refill:  Lexapro 02/27/20; Meloxicam 02/02/20  Future visit scheduled: No  Notes to clinic:  Route to provider for approval     Requested Prescriptions  Pending Prescriptions Disp Refills   escitalopram (LEXAPRO) 10 MG tablet [Pharmacy Med Name: ESCITALOPRAM 10 MG TABLET] 30 tablet 0    Sig: TAKE 1 TABLET BY MOUTH EVERY DAY      Psychiatry:  Antidepressants - SSRI Passed - 04/10/2020  4:58 PM      Passed - Valid encounter within last 6 months    Recent Outpatient Visits           3 weeks ago Grief at loss of child   [facility] Al[facility], MD   2 months ago Chronic pain of right ankle   [facility] Al[facility], MD   2 years ago Vaginitis and vulvovaginitis   [facility], Danna Hefty, MD   2 years ago Well woman exam   [facility] Alba Cory, MD   2 years ago Chest pain, unspecified type   [facility] Alba Cory, MD       Future Appointments             In 2 months Carlynn Purl, Danna Hefty, MD [facility]              meloxicam (MOBIC) 15 MG tablet [Pharmacy Med Name: MELOXICAM 15 MG TABLET] 30 tablet 1    Sig: TAKE 1 TABLET BY MOUTH EVERY DAY      Analgesics:  COX2 Inhibitors Failed - 04/10/2020  4:58 PM      Failed - HGB in normal range and within 360 days    Hemoglobin  Date Value Ref Range Status  07/24/2017 12.9 11.7 - 15.5 g/dL Final   HGB  Date Value Ref Range Status  08/20/2013 13.5 12.0 - 16.0 g/dL Final          Failed - Cr in normal range and within 360 days    Creat  Date Value Ref Range Status  07/24/2017 0.74 0.50 - 1.10 mg/dL Final          Passed - Patient is not pregnant      Passed - Valid encounter within last 12 months    Recent Outpatient Visits           3 weeks ago  Grief at loss of child   [facility] Alba Cory, MD   2 months ago Chronic pain of right ankle   [facility] Alba Cory, MD   2 years ago Vaginitis and vulvovaginitis   [facility] Alba Cory, MD   2 years ago Well woman exam   [facility] Al[facility] Cory, MD   2 years ago Chest pain, unspecified type   [facility] Alba Cory, MD       Future Appointments             In 2 months Carlynn Purl, Danna Hefty, MD [facility], [facility]

## 2020-04-11 ENCOUNTER — Other Ambulatory Visit: Payer: Self-pay

## 2020-04-11 ENCOUNTER — Encounter: Payer: Self-pay | Admitting: Psychiatry

## 2020-04-11 ENCOUNTER — Telehealth: Payer: Self-pay

## 2020-04-11 ENCOUNTER — Telehealth (INDEPENDENT_AMBULATORY_CARE_PROVIDER_SITE_OTHER): Payer: 59 | Admitting: Psychiatry

## 2020-04-11 DIAGNOSIS — Z634 Disappearance and death of family member: Secondary | ICD-10-CM | POA: Diagnosis not present

## 2020-04-11 DIAGNOSIS — Z9189 Other specified personal risk factors, not elsewhere classified: Secondary | ICD-10-CM

## 2020-04-11 DIAGNOSIS — F333 Major depressive disorder, recurrent, severe with psychotic symptoms: Secondary | ICD-10-CM | POA: Diagnosis not present

## 2020-04-11 DIAGNOSIS — F411 Generalized anxiety disorder: Secondary | ICD-10-CM

## 2020-04-11 DIAGNOSIS — F331 Major depressive disorder, recurrent, moderate: Secondary | ICD-10-CM | POA: Insufficient documentation

## 2020-04-11 DIAGNOSIS — Z79899 Other long term (current) drug therapy: Secondary | ICD-10-CM

## 2020-04-11 MED ORDER — ARIPIPRAZOLE 10 MG PO TABS: 10.0000 mg | ORAL_TABLET | Freq: Every day | ORAL | 1 refills | Status: DC

## 2020-04-11 MED ORDER — MIRTAZAPINE 15 MG PO TABS: 7.5000 mg | ORAL_TABLET | Freq: Every day | ORAL | 1 refills | Status: DC

## 2020-04-11 MED ORDER — CLONAZEPAM 1 MG PO TABS: 0.5000 mg | ORAL_TABLET | ORAL | 1 refills | Status: DC

## 2020-04-11 NOTE — Telephone Encounter (Signed)
labwork order was faxed and confirmed  

## 2020-04-11 NOTE — Patient Instructions (Signed)
Mirtazapine tablets What is this medicine? MIRTAZAPINE (mir TAZ a peen) is used to treat depression. This medicine may be used for other purposes; ask your health care provider or pharmacist if you have questions. COMMON BRAND NAME(S): Remeron What should I tell my health care provider before I take this medicine? They need to know if you have any of these conditions:  bipolar disorder  glaucoma  kidney disease  liver disease  suicidal thoughts  an unusual or allergic reaction to mirtazapine, other medicines, foods, dyes, or preservatives  pregnant or trying to get pregnant  breast-feeding How should I use this medicine? Take this medicine by mouth with a glass of water. Follow the directions on the prescription label. Take your medicine at regular intervals. Do not take your medicine more often than directed. Do not stop taking this medicine suddenly except upon the advice of your doctor. Stopping this medicine too quickly may cause serious side effects or your condition may worsen. A special MedGuide will be given to you by the pharmacist with each prescription and refill. Be sure to read this information carefully each time. Talk to your pediatrician regarding the use of this medicine in children. Special care may be needed. Overdosage: If you think you have taken too much of this medicine contact a poison control center or emergency room at once. NOTE: This medicine is only for you. Do not share this medicine with others. What if I miss a dose? If you miss a dose, take it as soon as you can. If it is almost time for your next dose, take only that dose. Do not take double or extra doses. What may interact with this medicine? Do not take this medicine with any of the following medications:  linezolid  MAOIs like Carbex, Eldepryl, Marplan, Nardil, and Parnate  methylene blue (injected into a vein) This medicine may also interact with the following  medications:  alcohol  antiviral medicines for HIV or AIDS  certain medicines that treat or prevent blood clots like warfarin  certain medicines for depression, anxiety, or psychotic disturbances  certain medicines for fungal infections like ketoconazole and itraconazole  certain medicines for migraine headache like almotriptan, eletriptan, frovatriptan, naratriptan, rizatriptan, sumatriptan, zolmitriptan  certain medicines for seizures like carbamazepine or phenytoin  certain medicines for sleep  cimetidine  erythromycin  fentanyl  lithium  medicines for blood pressure  nefazodone  rasagiline  rifampin  supplements like St. John's wort, kava kava, valerian  tramadol  tryptophan This list may not describe all possible interactions. Give your health care provider a list of all the medicines, herbs, non-prescription drugs, or dietary supplements you use. Also tell them if you smoke, drink alcohol, or use illegal drugs. Some items may interact with your medicine. What should I watch for while using this medicine? Tell your doctor if your symptoms do not get better or if they get worse. Visit your doctor or health care professional for regular checks on your progress. Because it may take several weeks to see the full effects of this medicine, it is important to continue your treatment as prescribed by your doctor. Patients and their families should watch out for new or worsening thoughts of suicide or depression. Also watch out for sudden changes in feelings such as feeling anxious, agitated, panicky, irritable, hostile, aggressive, impulsive, severely restless, overly excited and hyperactive, or not being able to sleep. If this happens, especially at the beginning of treatment or after a change in dose, call your health   care professional. Dennis Bast may get drowsy or dizzy. Do not drive, use machinery, or do anything that needs mental alertness until you know how this medicine  affects you. Do not stand or sit up quickly, especially if you are an older patient. This reduces the risk of dizzy or fainting spells. Alcohol may interfere with the effect of this medicine. Avoid alcoholic drinks. This medicine may cause dry eyes and blurred vision. If you wear contact lenses you may feel some discomfort. Lubricating drops may help. See your eye doctor if the problem does not go away or is severe. Your mouth may get dry. Chewing sugarless gum or sucking hard candy, and drinking plenty of water may help. Contact your doctor if the problem does not go away or is severe. What side effects may I notice from receiving this medicine? Side effects that you should report to your doctor or health care professional as soon as possible:  allergic reactions like skin rash, itching or hives, swelling of the face, lips, or tongue  anxious  changes in vision  chest pain  confusion  elevated mood, decreased need for sleep, racing thoughts, impulsive behavior  eye pain  fast, irregular heartbeat  feeling faint or lightheaded, falls  feeling agitated, angry, or irritable  fever or chills, sore throat  hallucination, loss of contact with reality  loss of balance or coordination  mouth sores  redness, blistering, peeling or loosening of the skin, including inside the mouth  restlessness, pacing, inability to keep still  seizures  stiff muscles  suicidal thoughts or other mood changes  trouble passing urine or change in the amount of urine  trouble sleeping  unusual bleeding or bruising  unusually weak or tired  vomiting Side effects that usually do not require medical attention (report to your doctor or health care professional if they continue or are bothersome):  change in appetite  constipation  dizziness  dry mouth  muscle aches or pains  nausea  tired  weight gain This list may not describe all possible side effects. Call your doctor for  medical advice about side effects. You may report side effects to FDA at 1-800-FDA-1088. Where should I keep my medicine? Keep out of the reach of children. Store at room temperature between 15 and 30 degrees C (59 and 86 degrees F) Protect from light and moisture. Throw away any unused medicine after the expiration date. NOTE: This sheet is a summary. It may not cover all possible information. If you have questions about this medicine, talk to your doctor, pharmacist, or health care provider.  2020 [employer] Standard (2016-03-07 17:30:45) Clonazepam tablets What is this medicine? CLONAZEPAM (kloe NA ze pam) is a benzodiazepine. It is used to treat certain types of seizures. It is also used to treat panic disorder. This medicine may be used for other purposes; ask your health care provider or pharmacist if you have questions. COMMON BRAND NAME(S): Ceberclon, Klonopin What should I tell my health care provider before I take this medicine? They need to know if you have any of these conditions:  an alcohol or drug abuse problem  bipolar disorder, depression, psychosis or other mental health condition  glaucoma  kidney or liver disease  lung or breathing disease  myasthenia gravis  Parkinson's disease  porphyria  seizures or a history of seizures  suicidal thoughts  an unusual or allergic reaction to clonazepam, other benzodiazepines, foods, dyes, or preservatives  pregnant or trying to get pregnant  breast-feeding How should I  use this medicine? Take this medicine by mouth with a glass of water. Follow the directions on the prescription label. If it upsets your stomach, take it with food or milk. Take your medicine at regular intervals. Do not take it more often than directed. Do not stop taking or change the dose except on the advice of your doctor or health care professional. A special MedGuide will be given to you by the pharmacist with each prescription and refill. Be  sure to read this information carefully each time. Talk to your pediatrician regarding the use of this medicine in children. Special care may be needed. Overdosage: If you think you have taken too much of this medicine contact a poison control center or emergency room at once. NOTE: This medicine is only for you. Do not share this medicine with others. What if I miss a dose? If you miss a dose, take it as soon as you can. If it is almost time for your next dose, take only that dose. Do not take double or extra doses. What may interact with this medicine? Do not take this medication with any of the following medicines:  narcotic medicines for cough  sodium oxybate This medicine may also interact with the following medications:  alcohol  antihistamines for allergy, cough and cold  antiviral medicines for HIV or AIDS  certain medicines for anxiety or sleep  certain medicines for depression, like amitriptyline, fluoxetine, sertraline  certain medicines for fungal infections like ketoconazole and itraconazole  certain medicines for seizures like carbamazepine, phenobarbital, phenytoin, primidone  general anesthetics like halothane, isoflurane, methoxyflurane, propofol  local anesthetics like lidocaine, pramoxine, tetracaine  medicines that relax muscles for surgery  narcotic medicines for pain  phenothiazines like chlorpromazine, mesoridazine, prochlorperazine, thioridazine This list may not describe all possible interactions. Give your health care provider a list of all the medicines, herbs, non-prescription drugs, or dietary supplements you use. Also tell them if you smoke, drink alcohol, or use illegal drugs. Some items may interact with your medicine. What should I watch for while using this medicine? Tell your doctor or health care professional if your symptoms do not start to get better or if they get worse. Do not stop taking except on your doctor's advice. You may develop a  severe reaction. Your doctor will tell you how much medicine to take. You may get drowsy or dizzy. Do not drive, use machinery, or do anything that needs mental alertness until you know how this medicine affects you. To reduce the risk of dizzy and fainting spells, do not stand or sit up quickly, especially if you are an older patient. Alcohol may increase dizziness and drowsiness. Avoid alcoholic drinks. If you are taking another medicine that also causes drowsiness, you may have more side effects. Give your health care provider a list of all medicines you use. Your doctor will tell you how much medicine to take. Do not take more medicine than directed. Call emergency for help if you have problems breathing or unusual sleepiness. The use of this medicine may increase the chance of suicidal thoughts or actions. Pay special attention to how you are responding while on this medicine. Any worsening of mood, or thoughts of suicide or dying should be reported to your health care professional right away. What side effects may I notice from receiving this medicine? Side effects that you should report to your doctor or health care professional as soon as possible:  allergic reactions like skin rash, itching or hives,  swelling of the face, lips, or tongue  breathing problems  confusion  loss of balance or coordination  signs and symptoms of low blood pressure like dizziness; feeling faint or lightheaded, falls; unusually weak or tired  suicidal thoughts or mood changes Side effects that usually do not require medical attention (report to your doctor or health care professional if they continue or are bothersome):  dizziness  headache  tiredness  upset stomach This list may not describe all possible side effects. Call your doctor for medical advice about side effects. You may report side effects to FDA at 1-800-FDA-1088. Where should I keep my medicine? Keep out of the reach of children. This  medicine can be abused. Keep your medicine in a safe place to protect it from theft. Do not share this medicine with anyone. Selling or giving away this medicine is dangerous and against the law. This medicine may cause accidental overdose and death if taken by other adults, children, or pets. Mix any unused medicine with a substance like cat litter or coffee grounds. Then throw the medicine away in a sealed container like a sealed bag or a coffee can with a lid. Do not use the medicine after the expiration date. Store at room temperature between 15 and 30 degrees C (59 and 86 degrees F). Protect from light. Keep container tightly closed. NOTE: This sheet is a summary. It may not cover all possible information. If you have questions about this medicine, talk to your doctor, pharmacist, or health care provider.  2020 [employer]/Gold Standard (2016-03-15 18:46:32)
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
## 2020-04-13 ENCOUNTER — Telehealth: Payer: Self-pay | Admitting: Family Medicine

## 2020-04-13 NOTE — Telephone Encounter (Signed)
Patient called to inform the doctor that her psychologist, Dr. Izora Ribas said that she needed a MRI of her chest and head and stated that the patient PCP would have to order it.  Please advise and call patient to let her know if this can be done.  CB# [phone]

## 2020-04-13 NOTE — Telephone Encounter (Signed)
Patient called back to clarify that in the first message it was to be an EKG not an MRI so please disregard the MRI.

## 2020-04-15 LAB — CBC WITH DIFFERENTIAL
Basophils Absolute: 0 10*3/uL (ref 0.0–0.2)
Basos: 0 %
EOS (ABSOLUTE): 0.1 10*3/uL (ref 0.0–0.4)
Eos: 1 %
Hematocrit: 40.6 % (ref 34.0–46.6)
Hemoglobin: 14.1 g/dL (ref 11.1–15.9)
Immature Grans (Abs): 0 10*3/uL (ref 0.0–0.1)
Immature Granulocytes: 0 %
Lymphocytes Absolute: 1.8 10*3/uL (ref 0.7–3.1)
Lymphs: 23 %
MCH: 31.9 pg (ref 26.6–33.0)
MCHC: 34.7 g/dL (ref 31.5–35.7)
MCV: 92 fL (ref 79–97)
Monocytes Absolute: 0.4 10*3/uL (ref 0.1–0.9)
Monocytes: 5 %
Neutrophils Absolute: 5.3 10*3/uL (ref 1.4–7.0)
Neutrophils: 71 %
RBC: 4.42 x10E6/uL (ref 3.77–5.28)
RDW: 12.7 % (ref 11.7–15.4)
WBC: 7.6 10*3/uL (ref 3.4–10.8)

## 2020-04-15 LAB — LIPID PANEL
Chol/HDL Ratio: 3.4 ratio (ref 0.0–4.4)
Cholesterol, Total: 208 mg/dL — ABNORMAL HIGH (ref 100–199)
HDL: 61 mg/dL (ref >39)
LDL Chol Calc (NIH): 131 mg/dL — ABNORMAL HIGH (ref 0–99)
Triglycerides: 90 mg/dL (ref 0–149)
VLDL Cholesterol Cal: 16 mg/dL (ref 5–40)

## 2020-04-15 LAB — PROLACTIN: Prolactin: 21.7 ng/mL (ref 4.8–23.3)

## 2020-04-15 LAB — HEMOGLOBIN A1C
Est. average glucose Bld gHb Est-mCnc: 103 mg/dL
Hgb A1c MFr Bld: 5.2 % (ref 4.8–5.6)

## 2020-04-15 LAB — TSH: TSH: 1.09 u[IU]/mL (ref 0.450–4.500)

## 2020-04-18 ENCOUNTER — Ambulatory Visit: Payer: Managed Care, Other (non HMO) | Admitting: Podiatry

## 2020-04-18 ENCOUNTER — Other Ambulatory Visit: Payer: Self-pay

## 2020-04-18 DIAGNOSIS — G8929 Other chronic pain: Secondary | ICD-10-CM

## 2020-04-18 DIAGNOSIS — M7661 Achilles tendinitis, right leg: Secondary | ICD-10-CM

## 2020-04-18 DIAGNOSIS — M25571 Pain in right ankle and joints of right foot: Secondary | ICD-10-CM

## 2020-04-18 NOTE — Progress Notes (Signed)
   HPI: 39 y.o. female presenting today for follow-up treatment and evaluation regarding Achilles tendinitis to the right lower extremity.  Patient states that the injection helped significantly.  She has been wearing the boot as directed but it is causing some leg irritation and cramping.  She completed her whole medication of meloxicam and notices overall improvement.  She presents for further treatment evaluation.   Past Medical History:  Diagnosis Date  . Back pain   . Depression   . Migraine   . Palpitation   . Panic disorder   . RLS (restless legs syndrome)       Physical Exam: General: The patient is alert and oriented x3 in no acute distress.  Dermatology: Skin is warm, dry and supple bilateral lower extremities. Negative for open lesions or macerations.  Vascular: Palpable pedal pulses bilaterally. No edema or erythema noted. Capillary refill within normal limits.  Neurological: Epicritic and protective threshold grossly intact bilaterally.   Musculoskeletal Exam: Pain on palpation noted to the posterior tubercle of the right calcaneus at the insertion of the Achilles tendon consistent with retrocalcaneal bursitis. Range of motion within normal limits. Muscle strength 5/5 in all muscle groups bilateral lower extremities.  Assessment: 1. Insertional Achilles tendinitis right  2. Retrocalcaneal bursitis   Plan of Care:  1. Patient was evaluated.  2. Injection of 0.5 mL Celestone Soluspan injected into the retrocalcaneal bursa. Care was taken to avoid direct injection into the Achilles tendon. 3.  Discontinue cam boot 4.  Compression ankle sleeve dispensed.  Discontinue Ace wraps 5. Refill prescription for Meloxicam provided to patient.  6.  Recommend daily stretching exercises and Achilles stretching exercises. 7.  Return to clinic in 59 weeks  67 year old son, Inez Pilgrim, passed away from Leukemia in 02/20/20.  Going to [place] next week   Felecia Shelling, DPM [facility]  Dr. Felecia Shelling, DPM    [address]                                        [place], [place] [address]                Office [phone]  Fax [phone]

## 2020-05-01 ENCOUNTER — Other Ambulatory Visit: Payer: Self-pay

## 2020-05-01 ENCOUNTER — Ambulatory Visit (INDEPENDENT_AMBULATORY_CARE_PROVIDER_SITE_OTHER): Payer: 59 | Admitting: Licensed Clinical Social Worker

## 2020-05-01 DIAGNOSIS — Z634 Disappearance and death of family member: Secondary | ICD-10-CM

## 2020-05-01 DIAGNOSIS — F333 Major depressive disorder, recurrent, severe with psychotic symptoms: Secondary | ICD-10-CM

## 2020-05-01 NOTE — Patient Instructions (Addendum)
Grief publication:  "Quiet Times for those who [place]" by Kathe Mariner  ~Doni Widmer   Caring for Your Mental Health Mental health is emotional, psychological, and social well-being. Mental health is just as important as physical health. In fact, mental and physical health are connected, and you need both to be healthy. Some signs of good mental health (well-being) include:  Being able to attend to tasks at home, school, or work.  Being able to manage stress and emotions.  Practicing self-care, which may include: ? A regular exercise pattern. ? A reasonably healthy diet. ? Supportive and trusting relationships. ? The ability to relax and calm yourself (self-calm).  Having pleasurable hobbies and activities to do.  Believing that you have meaning and purpose in your life.  Recovering and adjusting after facing challenges (resilience). You can take steps to build or strengthen these mentally healthy behaviors. There are resources and support to help you with this. Why is caring for mental health important? Caring for your mental health is a big part of staying healthy. Everyone has times when feelings, thoughts, or situations feel overwhelming. Mental health means having the skills to manage what feels overwhelming. If this sense of being overwhelmed persists, however, you might need some help. If you have some of the following signs, you may need to take better care of your mental health or seek help from a health care provider or mental health professional:  Problems with energy or focus.  Changes in eating habits.  Problems sleeping, such as sleeping too much or not enough.  Emotional distress, such as anger, sadness, depression, or anxiety.  Major changes in your relationships.  Losing interest in life or activities that you used to enjoy. If you have any of these symptoms on most days for 2 weeks or longer:  Talk with a close friend or family member about how you are  feeling.  Contact your health care provider to discuss your symptoms.  Consider working with a Financial trader. Your health care provider, family, or friends may be able to recommend a therapist. What can I do to promote emotional and mental health? Managing emotions  Learn to identify emotions and deal with them. Recognizing your emotions is the first step in learning to deal with them.  Practice ways to appropriately express feelings. Remember that you can control your feelings. They do not control you.  Practice stress management techniques, such as: ? Relaxation techniques, like breathing or muscle relaxation exercises. ? Exercise. Regular activity can lower your stress level. ? Changing what you can change and accepting what you cannot change.  Build up your resilience so that you can recover and adjust after big problems or challenges. Practice resilient behaviors and attitudes: ? Set and focus on long-term goals. ? Develop and maintain healthy, supportive relationships. ? Learn to accept change and make the best of the situation. ? Take care of yourself physically by eating a healthy diet, getting plenty of sleep, and exercising regularly. ? Develop self-awareness. Ask others to give feedback about how they see you. ? Practice mindfulness meditation to help you stay calm when dealing with daily challenges. ? Learn to respond to situations in healthy ways, rather than reacting with your emotions. ? Keep a positive attitude, and believe in yourself. Your view of yourself affects your mental health. ? Develop your listening and empathy skills. These will help you deal with difficult situations and communications.  Remember that emotions can be used as a good source of  communication and are a great source of energy. Try to laugh and find humor in life. Sleeping  Get the right amount and quality of sleep. Sleep has a big impact on physical and mental health. To improve  your sleep: ? Go to bed and wake up around the same time every day. ? Limit screen time before bedtime. This includes the use of your cell phone, TV, computer, and tablet. ? Keep your bedroom dark and cool. Activity   Exercise or do some physical activity regularly. This helps: ? Keep your body strong, especially during times of stress. ? Get rid of chemicals in your body (hormones) that build up when you are stressed. ? Build up your resilience. Eating and drinking   Eat a healthy diet that includes whole grains, vegetables, fresh fruits, and lean proteins. If you have questions about what foods are best for you, ask your health care provider.  Try not to turn to sweet, salty, or otherwise unhealthy foods when you are tired or unhappy. This can lead to unwanted weight gain and is not a healthy way to cope with emotions. Where to find more information You can find more information about how to care for your mental health from:  The [employer] on Mental Illness (NAMI): www.nami.[employer] of Mental Health: http://www.maynard.net/  Centers for Disease Control and Prevention: https://www.washington.net/ Contact a health care provider if:  You lose interest in being with others or you do not want to leave the house.  You have a hard time completing your normal activities or you have less energy than normal.  You cannot stay focused or you have problems with memory.  You feel that your senses are heightened, and this makes you upset or concerned.  You feel nervous or have rapid mood changes.  You are sleeping or eating more or less than normal.  You question reality or you show odd behavior that disturbs you or others. Get help right away if:  You have thoughts about hurting yourself or others. If you ever feel like you may hurt yourself or others, or have thoughts about taking your own life, get help right away. You can go to your nearest emergency department or  call:  Your local emergency services (911 in the [place]  A suicide crisis helpline, such as the National Suicide Prevention Lifeline at 951-275-1602. This is open 24 hours a day. Summary  Mental health is not just the absence of mental illness. It involves understanding your emotions and behaviors, and taking steps to cope with them in a healthy way.  If you have symptoms of mental or emotional distress, get help from family, friends, a health care provider, or a mental health professional.  Practice good mental health behaviors such as stress management skills, self-calming skills, exercise, and healthy sleeping and eating. This information is not intended to replace advice given to you by your health care provider. Make sure you discuss any questions you have with your health care provider. Document Revised: 09/19/2017 Document Reviewed: 02/18/2017 Elsevier Patient Education  2020 [employer].

## 2020-05-01 NOTE — Progress Notes (Signed)
Virtual Visit via Video Note  I connected with Virginia Walls on 05/01/20 at  2:30 PM EDT by a video enabled telemedicine application and verified that I am speaking with the correct person using two identifiers.  Location: Patient: home Provider: [facility]   I discussed the limitations of evaluation and management by telemedicine and the availability of in person appointments. The patient expressed understanding and agreed to proceed.   I discussed the assessment and treatment plan with the patient. The patient was provided an opportunity to ask questions and all were answered. The patient agreed with the plan and demonstrated an understanding of the instructions.   The patient was advised to call back or seek an in-person evaluation if the symptoms worsen or if the condition fails to improve as anticipated.  I provided 45 minutes of non-face-to-face time during this encounter.   Tavarus Poteete R Naydelin Ziegler, LCSW    THERAPIST PROGRESS NOTE  Session Time: 2:30-3:15 pm  Participation Level: Active  Behavioral Response: Neat and Well GroomedAlertDepressed  Type of Therapy: Individual Therapy  Treatment Goals addressed: Coping  Interventions: Supportive and Other: grief counseling  Summary: Virginia Walls is a 39 y.o. female who presents with continuing mood disturbance triggered by loss of son in April 2021. 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 Pt reporting that she is unable to function at work due to the intensity of her depression/grief symptoms. Pt reports that she is starting to eat better--symptoms were impacting ability to eat initially. Pt tries to go out and walk with dogs regularly, but often doesn't have the energy. Pt resting well--just got back from vacation in [place] with family. Pt was able to enjoy the vacation.   Suicidal/Homicidal: No  Therapist Response: Allowed pt safe place to explore thoughts and feelings associated with recent  loss. Discussed pts relationships with several family members and their thoughts/opinions of how pt is healing. Encouraged pt to focus on self and self-care--think of herself as in recovery at this time and to allow space and time needed to recover emotionally. Discussed local hospice groups for pt and family members.   Plan: Return again in 2 weeks. Continue grief counseling.   Diagnosis: Axis I: Bereavement and Major Depression, Recurrent severe    Axis II: No diagnosis  Ernest Haber Kiyani Jernigan, LCSW 05/01/2020

## 2020-05-15 ENCOUNTER — Ambulatory Visit: Payer: 59 | Admitting: Licensed Clinical Social Worker

## 2020-05-18 ENCOUNTER — Other Ambulatory Visit: Payer: Self-pay

## 2020-05-18 ENCOUNTER — Ambulatory Visit (INDEPENDENT_AMBULATORY_CARE_PROVIDER_SITE_OTHER): Payer: 59 | Admitting: Licensed Clinical Social Worker

## 2020-05-18 DIAGNOSIS — Z634 Disappearance and death of family member: Secondary | ICD-10-CM

## 2020-05-18 DIAGNOSIS — F411 Generalized anxiety disorder: Secondary | ICD-10-CM

## 2020-05-18 DIAGNOSIS — F333 Major depressive disorder, recurrent, severe with psychotic symptoms: Secondary | ICD-10-CM | POA: Diagnosis not present
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
## 2020-05-18 NOTE — Progress Notes (Signed)
Virtual Visit via Video Note  I connected with Virginia Walls on 05/18/20 at 11:00 AM EDT by a video enabled telemedicine application and verified that I am speaking with the correct person using two identifiers.  Location: Patient: home Provider: [facility]   I discussed the limitations of evaluation and management by telemedicine and the availability of in person appointments. The patient expressed understanding and agreed to proceed.   I discussed the assessment and treatment plan with the patient. The patient was provided an opportunity to ask questions and all were answered. The patient agreed with the plan and demonstrated an understanding of the instructions.   The patient was advised to call back or seek an in-person evaluation if the symptoms worsen or if the condition fails to improve as anticipated.  I provided 60 minutes of non-face-to-face time during this encounter.   Virginia Faught R Manaal Mandala, LCSW    THERAPIST PROGRESS NOTE  Session Time: 11:00-12:00  Participation Level: Active  Behavioral Response: Neat and Well GroomedAlertAnxious and Depressed  Type of Therapy: Individual Therapy  Treatment Goals addressed: Anxiety and Coping  Interventions: Supportive  Summary: Virginia Walls is a 39 y.o. female who presents with symptoms consistent with her diagnoses. Processed through thoughts and feelings associated with family relationships, situations, and current levels of functioning.  Completed CCA to have background data. Pt reports that she is compliant with medication and that she is getting fair quality and quantity of sleep. Appetite is fluctuating depending on the day.   Reviewed stages of grief and how trauma/grief impacts an individual on a cognitive level.  Emphasized importance of overall self care, keeping life in balance, and focusing on emotional and physical wellness.   Suicidal/Homicidal: Walls  Therapist Response: Virginia Walls is displaying fluctuating/intermittent  progress due to continuing external stressors related to grief. LCSW counselor will continue CBT and grief counseling to help pt process through recent traumatic loss.  Plan: Return again in 2 weeks.  Diagnosis: Axis I: Bereavement, Generalized Anxiety Disorder and Major Depression, Recurrent severe    Axis II: Walls diagnosis    Virginia Haber Remi Lopata, LCSW 05/18/2020

## 2020-05-30 ENCOUNTER — Ambulatory Visit: Payer: Managed Care, Other (non HMO) | Admitting: Podiatry

## 2020-06-01 ENCOUNTER — Ambulatory Visit (INDEPENDENT_AMBULATORY_CARE_PROVIDER_SITE_OTHER): Payer: 59 | Admitting: Licensed Clinical Social Worker

## 2020-06-01 ENCOUNTER — Other Ambulatory Visit: Payer: Self-pay

## 2020-06-01 DIAGNOSIS — Z634 Disappearance and death of family member: Secondary | ICD-10-CM | POA: Diagnosis not present

## 2020-06-01 DIAGNOSIS — F333 Major depressive disorder, recurrent, severe with psychotic symptoms: Secondary | ICD-10-CM

## 2020-06-01 DIAGNOSIS — F411 Generalized anxiety disorder: Secondary | ICD-10-CM | POA: Diagnosis not present

## 2020-06-01 NOTE — Progress Notes (Signed)
Virtual Visit via Video Note  I connected with Virginia Walls on 06/01/20 at 10:00 AM EDT by a video enabled telemedicine application and verified that I am speaking with the correct person using two identifiers.  Location: Patient: home Provider: [facility]   I discussed the limitations of evaluation and management by telemedicine and the availability of in person appointments. The agreed to proceed.   patient expressed understanding and  The patient was advised to call back or seek an in-person evaluation if the symptoms worsen or if the condition fails to improve as anticipated.  I provided 45 minutes of non-face-to-face time during this encounter.   Virginia Richer R Brigitta Pricer, LCSW    THERAPIST PROGRESS NOTE  Session Time: 10:00-10:45 am  Participation Level: Active  Behavioral Response: Neat and Well GroomedAlertDepressed  Type of Therapy: Individual Therapy  Treatment Goals addressed: Coping  Interventions: Supportive and Other: grief counseling  Summary: Virginia Walls is a 39 y.o. female who presents with continuing symptoms related to her diagnosis (depression, anxiety, grief).  Pt reports that mood is fluctuating depending on the day/situation and that her stress levels are currently high.  Allowed pt to explore and express thoughts and feelings associated with continuing grief. Pt feels uncomfortable in her home and wants to move--current house belongs to husband's family.  "it will be hard for [place] to move but its something that I need to do". Allowed patient to work through some pros and cons of staying versus leaving her home.  Discussed pts current work situation--on temporary leave. Discussed pts request for a different position when pt does transition back to the office to experience less on-the-job stress.  Reviewed importance of overall self-care, keeping life in balance, good physical wellness, and social engagement. Pt agrees and is trying hard in all areas.    Suicidal/Homicidal: No  Therapist Response: Virginia Walls was actively engaged throughout session. Pt reports that she is continuing to have breakthrough crying spells (sometimes for extended periods of time). Pt is having fluctuating/intermittent progress.   Plan: Return again in 2 weeks. Ongoing treatment plan to include managing grief, mood, and overall anxiety/stress.   Diagnosis: Axis I: Bereavement, Generalized Anxiety Disorder and Major Depression, Recurrent severe with psychosis    Axis II: No diagnosis    Virginia Haber Malavika Lira, LCSW 06/01/2020

## 2020-06-07 ENCOUNTER — Telehealth: Payer: Self-pay

## 2020-06-07 NOTE — Telephone Encounter (Signed)
Patient called to check on the status of her paperwork from virtual visit on 06/10/20. Please advise. Thank you.

## 2020-06-08 NOTE — Telephone Encounter (Signed)
Printed summary of last visit and gave to office staff. Staff to call Virginia Walls to get contact information/fax information of where to forward confidential information.

## 2020-06-11 ENCOUNTER — Other Ambulatory Visit: Payer: Self-pay | Admitting: Psychiatry

## 2020-06-11 DIAGNOSIS — F333 Major depressive disorder, recurrent, severe with psychotic symptoms: Secondary | ICD-10-CM

## 2020-06-11 DIAGNOSIS — Z634 Disappearance and death of family member: Secondary | ICD-10-CM

## 2020-06-11 DIAGNOSIS — F411 Generalized anxiety disorder: Secondary | ICD-10-CM

## 2020-06-12 NOTE — Patient Instructions (Signed)

## 2020-06-13 ENCOUNTER — Other Ambulatory Visit: Payer: Self-pay

## 2020-06-13 ENCOUNTER — Ambulatory Visit: Payer: Managed Care, Other (non HMO) | Admitting: Family Medicine

## 2020-06-13 ENCOUNTER — Encounter: Payer: Self-pay | Admitting: Family Medicine

## 2020-06-13 VITALS — BP 110/80 | HR 96 | Temp 98.2°F | Resp 16 | Ht 64.0 in | Wt 161.0 lb

## 2020-06-13 DIAGNOSIS — G43009 Migraine without aura, not intractable, without status migrainosus: Secondary | ICD-10-CM | POA: Diagnosis not present

## 2020-06-13 DIAGNOSIS — Z23 Encounter for immunization: Secondary | ICD-10-CM | POA: Diagnosis not present

## 2020-06-13 DIAGNOSIS — Z1159 Encounter for screening for other viral diseases: Secondary | ICD-10-CM

## 2020-06-13 DIAGNOSIS — Z79899 Other long term (current) drug therapy: Secondary | ICD-10-CM

## 2020-06-13 DIAGNOSIS — E559 Vitamin D deficiency, unspecified: Secondary | ICD-10-CM

## 2020-06-13 DIAGNOSIS — F411 Generalized anxiety disorder: Secondary | ICD-10-CM

## 2020-06-13 DIAGNOSIS — F333 Major depressive disorder, recurrent, severe with psychotic symptoms: Secondary | ICD-10-CM | POA: Diagnosis not present

## 2020-06-13 MED ORDER — VITAMIN D (ERGOCALCIFEROL) 1.25 MG (50000 UNIT) PO CAPS: 50000.0000 [IU] | ORAL_CAPSULE | ORAL | 0 refills | Status: DC

## 2020-06-13 MED ORDER — NURTEC 75 MG PO TBDP: 1.0000 | ORAL_TABLET | ORAL | 2 refills | Status: DC

## 2020-06-13 MED ORDER — TOPIRAMATE 100 MG PO TABS: 100.0000 mg | ORAL_TABLET | Freq: Two times a day (BID) | ORAL | 0 refills | Status: DC

## 2020-06-13 NOTE — Progress Notes (Signed)
Name: SHELIA KINGSBERRY   MRN: [id number]    DOB: September 28, 1981   Date:06/13/2020       Progress Note  Subjective  Chief Complaint  Chief Complaint  Patient presents with  . Anxiety  . Depression    HPI  Grief/Major depression recurrent: she states first episode of depression was after the birth of her second son she was given medications but was not compliant with it, she states she never felt back to normal, she states keeps things to herself and cannot handle stress well, she states she was depressed before her son got the diagnosis of leukemia back in Dec 2020 , but it got progressively worse as he got sicker and died on 2020/02/15 .Marland Kitchen  She states episodes in the past  Were never as bad  She has been on FMLA since [facility] was [facility] . She was seen in April 2021  and had been  crying daily for the previous month. She was having  poor appetite, could not  sleep at night, had suicidal thoughts but no planning. We started her on Lexapro but symptoms did not improve and she started to have visual hallucinations.  She was seen by Dr. Elna Breslow in June and has bee seeing a therapist every 2 weeks. She is off Lexapro and taking Abilify 10 mg daily. She continues to cry, is withdrawn, she is sleeping better with Remeron but makes her feel sllugish when she wakes up so she has been skipping doses, appetite is still poor. She has been able to take a shower and care for her appearance. She states her son is always on her mind and also worries about her two other sons. 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 She needs FMLA to be done    Migraine: she states pain can start on nuchal area or frontal area and radiates to opposite direction. Described as pounding, sharp associated with dizziness, photophobia, no nausea or vomiting. Taking Excedrin migraine prn most days of the week, we will go up on  Topamax to 100 mg BID and add Nurteq, avoid nsaid's   Vitamin D def: she is off supplements, we will send refill of rx today   Patient Active Problem List   Diagnosis Date Noted  . MDD (major depressive disorder), recurrent, severe, with psychosis (HCC) 04/11/2020  . GAD (generalized anxiety disorder) 04/11/2020  . Bereavement 04/11/2020  . At risk for prolonged QT interval syndrome 04/11/2020  . High risk medication use 04/11/2020  . Anxiety 11/13/2017  . Chronic constipation 08/12/2017  . Hyperglycemia 05/04/2015  . Tattoos 05/04/2015  . Headache, migraine 04/29/2015  . Excess weight 04/29/2015  . Restless leg 04/29/2015  . Snores 04/29/2015  . Vitamin D deficiency 04/29/2015    Past Surgical History:  Procedure Laterality Date  . ABDOMINAL HYSTERECTOMY    . TUBAL LIGATION      Family History  Problem Relation Age of Onset  . Heart disease Mother   . Asthma Mother   . Hypothyroidism Mother   . Bipolar disorder Mother   . Hypothyroidism Sister   . Diabetes Sister   . Hyperlipidemia Sister   . Asthma Son   . Heart disease Paternal Grandmother        Pacemaker  . Leukemia Paternal Grandmother   . Diabetes Paternal Grandmother   . Bipolar disorder Maternal Aunt   .  Bipolar disorder Maternal Uncle     Social History   Tobacco Use  . Smoking status: Current Some Day Smoker    Packs/day: 0.25    Years: 4.00    Pack years: 1.00    Types: Cigarettes    Start date: 05/04/2011  . Smokeless tobacco: Never Used  Substance Use Topics  . Alcohol use: Yes    Alcohol/week: 0.0 standard drinks    Comment: occasionally     Current Outpatient Medications:  .  ARIPiprazole (ABILIFY) 10 MG tablet, TAKE 1 TABLET BY MOUTH EVERY DAY, Disp: 30 tablet, Rfl: 1 .  clonazePAM (KLONOPIN) 1 MG tablet, Take 0.5-1 tablets (0.5-1 mg total) by mouth as directed. Start taking half tablet daily during the day as needed for anxiety attacks and one tablet at bedtime as needed for sleep  and anxiety- please limit use, Disp: 45 tablet, Rfl: 1 .  mirtazapine (REMERON) 15 MG tablet, TAKE 0.5-1 TABLETS (7.5-15 MG TOTAL) BY MOUTH AT BEDTIME. SLEEP AND MOOD, Disp: 30 tablet, Rfl: 1 .  topiramate (TOPAMAX) 100 MG tablet, Take 1 tablet (100 mg total) by mouth 2 (two) times daily. Max of 100  Mg twice daily, Disp: 180 tablet, Rfl: 0 .  Vitamin D, Ergocalciferol, (DRISDOL) 1.25 MG (50000 UNIT) CAPS capsule, Take 1 capsule (50,000 Units total) by mouth every 7 (seven) days., Disp: 12 capsule, Rfl: 0 .  Rimegepant Sulfate (NURTEC) 75 MG TBDP, Take 1 tablet by mouth every other day., Disp: 16 tablet, Rfl: 2  Allergies  Allergen Reactions  . Corylus   . Hydrocodone     I personally reviewed active problem list, medication list, allergies, family history, social history, health maintenance with the patient/caregiver today.   ROS  Ten systems reviewed and is negative except as mentioned in HPI   Objective  Vitals:   06/13/20 1313  BP: 110/80  Pulse: 96  Resp: 16  Temp: 98.2 F (36.8 C)  TempSrc: Oral  SpO2: 100%  Weight: 161 lb (73 kg)  Height: 5\' 4"  (1.626 m)    Body mass index is 27.64 kg/m.  Physical Exam  Constitutional: Patient appears well-developed and well-nourished. Overweight No distress.  HEENT: head atraumatic, normocephalic, pupils equal and reactive to light, neck supple Cardiovascular: Normal rate, regular rhythm and normal heart sounds.  No murmur heard. No BLE edema. Pulmonary/Chest: Effort normal and breath sounds normal. No respiratory distress. Abdominal: Soft.  There is no tenderness. Psychiatric: Patient has a depressed mood, crying Judgment and thought content normal.  Recent Results (from the past 2160 hour(s))  TSH     Status: None   Collection Time: 04/14/20 11:26 AM  Result Value Ref Range   TSH 1.090 0.450 - 4.500 uIU/mL  Lipid panel     Status: Abnormal   Collection Time: 04/14/20 11:26 AM  Result Value Ref Range   Cholesterol, Total  208 (H) 100 - 199 mg/dL   Triglycerides 90 0 - 149 mg/dL   HDL 61 04/16/20 mg/dL   VLDL Cholesterol Cal 16 5 - 40 mg/dL   LDL Chol Calc (NIH) >46 (H) 0 - 99 mg/dL   Chol/HDL Ratio 3.4 0.0 - 4.4 ratio    Comment:                                   T. Chol/HDL Ratio  Men  Women                               1/2 Avg.Risk  3.4    3.3                                   Avg.Risk  5.0    4.4                                2X Avg.Risk  9.6    7.1                                3X Avg.Risk 23.4   11.0   Prolactin     Status: None   Collection Time: 04/14/20 11:26 AM  Result Value Ref Range   Prolactin 21.7 4.8 - 23.3 ng/mL  Hemoglobin A1C     Status: None   Collection Time: 04/14/20 11:26 AM  Result Value Ref Range   Hgb A1c MFr Bld 5.2 4.8 - 5.6 %    Comment:          Prediabetes: 5.7 - 6.4          Diabetes: >6.4          Glycemic control for adults with diabetes: <7.0    Est. average glucose Bld gHb Est-mCnc 103 mg/dL  CBC With Differential     Status: None   Collection Time: 04/14/20 11:26 AM  Result Value Ref Range   WBC 7.6 3.4 - 10.8 x10E3/uL   RBC 4.42 3.77 - 5.28 x10E6/uL   Hemoglobin 14.1 11.1 - 15.9 g/dL   Hematocrit 31.4 97.0 - 46.6 %   MCV 92 79 - 97 fL   MCH 31.9 26.6 - 33.0 pg   MCHC 34.7 31 - 35 g/dL   RDW 26.3 78.5 - 88.5 %   Neutrophils 71 Not Estab. %   Lymphs 23 Not Estab. %   Monocytes 5 Not Estab. %   Eos 1 Not Estab. %   Basos 0 Not Estab. %   Neutrophils Absolute 5.3 1 - 7 x10E3/uL   Lymphocytes Absolute 1.8 0 - 3 x10E3/uL   Monocytes Absolute 0.4 0 - 0 x10E3/uL   EOS (ABSOLUTE) 0.1 0.0 - 0.4 x10E3/uL   Basophils Absolute 0.0 0 - 0 x10E3/uL   Immature Granulocytes 0 Not Estab. %   Immature Grans (Abs) 0.0 0.0 - 0.1 x10E3/uL      PHQ2/9: Depression screen [facility] 2/9 06/13/2020 05/18/2020 03/15/2020 02/02/2020 08/27/2017  Decreased Interest 3 2 3 2  0  Down, Depressed, Hopeless 3 3 3 3  0  PHQ - 2 Score 6 5 6 5  0   Altered sleeping 3 3 3 3  -  Tired, decreased energy 3 3 3 3  -  Change in appetite 3 1 3 3  -  Feeling bad or failure about yourself  2 3 3 3  -  Trouble concentrating 2 3 3 3  -  Moving slowly or fidgety/restless 3 2 3 3  -  Suicidal thoughts 0 0 2 1 -  PHQ-9 Score 22 20 26 24  -  Difficult doing work/chores Somewhat difficult - Extremely dIfficult Extremely dIfficult -    phq 9 is positive   Fall Risk: Fall Risk  06/13/2020 03/15/2020 02/02/2020 08/27/2017 07/24/2017  Falls in the past year? 1 0 0 No No  Number falls in past yr: 1 0 0 - -  Injury with Fall? 1 0 0 - -    Assessment & Plan  1. MDD (major depressive disorder), recurrent, severe, with psychosis (HCC)  - EKG 12-Lead  2. GAD (generalized anxiety disorder)   3. Vitamin D deficiency  - Vitamin D, Ergocalciferol, (DRISDOL) 1.25 MG (50000 UNIT) CAPS capsule; Take 1 capsule (50,000 Units total) by mouth every 7 (seven) days.  Dispense: 12 capsule; Refill: 0  4. Migraine without aura and without status migrainosus, not intractable  - topiramate (TOPAMAX) 100 MG tablet; Take 1 tablet (100 mg total) by mouth 2 (two) times daily. Max of 100  Mg twice daily  Dispense: 180 tablet; Refill: 0 - Rimegepant Sulfate (NURTEC) 75 MG TBDP; Take 1 tablet by mouth every other day.  Dispense: 16 tablet; Refill: 2  5. Need for hepatitis C screening test  - Hepatitis C Antibody  6. Long-term use of high-risk medication  - EKG 12-Lead

## 2020-06-14 ENCOUNTER — Ambulatory Visit (INDEPENDENT_AMBULATORY_CARE_PROVIDER_SITE_OTHER): Payer: Managed Care, Other (non HMO) | Admitting: Licensed Clinical Social Worker

## 2020-06-14 DIAGNOSIS — F411 Generalized anxiety disorder: Secondary | ICD-10-CM

## 2020-06-14 DIAGNOSIS — F333 Major depressive disorder, recurrent, severe with psychotic symptoms: Secondary | ICD-10-CM | POA: Diagnosis not present

## 2020-06-14 DIAGNOSIS — Z634 Disappearance and death of family member: Secondary | ICD-10-CM | POA: Diagnosis not present

## 2020-06-14 LAB — HEPATITIS C ANTIBODY
Hepatitis C Ab: NONREACTIVE
SIGNAL TO CUT-OFF: 0.01 (ref <1.00)

## 2020-06-14 NOTE — Progress Notes (Signed)
Virtual Visit via Video Note  I connected with Virginia Walls on 06/14/20 at 11:00 AM EDT by a video enabled telemedicine application and verified that I am speaking with the correct person using two identifiers.  Location: Patient: home Provider: remote office [place], [place])   I discussed the limitations of evaluation and management by telemedicine and the availability of in person appointments. The patient expressed understanding and agreed to proceed.   I discussed the assessment and treatment plan with the patient. The patient was provided an opportunity to ask questions and all were answered. The patient agreed with the plan and demonstrated an understanding of the instructions.   The patient was advised to call back or seek an in-person evaluation if the symptoms worsen or if the condition fails to improve as anticipated.  I provided 30 minutes of non-face-to-face time during this encounter.   Virginia Walls R Virginia Mcfarlane, LCSW    THERAPIST PROGRESS NOTE  Session Time: 11:05-11:35  Participation Level: Active  Behavioral Response: Neat and Well GroomedAlertDepressed  Type of Therapy: Individual Therapy  Treatment Goals addressed: Coping  Interventions: Supportive and Other: grief counseling  Summary: Virginia Walls is a 39 y.o. female who presents with continuing symptoms related to her diagnosis (depression, anxiety, bereavement). Virginia Walls reports that she has been compliant with her medications, has fair appetite, and sleep quality is fluctuating.   Allowed pt to explore and express thoughts and feelings associated with upcoming birthday of late son and older son (this weekend). Pt has been dreading this birthday for a long time.  Allowed pt to fully express the pain that she is feeling.  Pt feels that other people don't understand what she is going through and expect her to put on a happy face and engage with society. Pt reports that she does not feel ready for that right  now.  Pt reporting that the sleep meds make her feel groggy throughout the day, but that they are necessary to sleep. Pt feels overall focus and concentration are still hard for her. Pt feels paralyzed emotionally at times.   Reviewed mindfulness and relaxation strategies. Encouraged healthy activity levels for mental stimulation. Encouraged healthy social engagement--not if it is overwhelming to pt. Reviewed sleep hygiene tips.   Suicidal/Homicidal: No   Therapist Response: Virginia Walls reports an increase in anxiety and depression symptoms triggered by late son's upcoming birthday.  This is indicative of fluctuating/intermittent progress.   Plan: Return again in 2 weeks. Ongoing treatment plan to include more frequent OPT visits focusing on grief management, mood management, and anxiety/stress management. We will continue to review goals and focus on increasing healthy coping mechanisms. LCSW counselor to fill out mental health progress report and fax it to pts short-term disability company.  Diagnosis: Axis I: Bereavement, Generalized Anxiety Disorder and Major Depression, Recurrent moderate    Axis II: No diagnosis    Virginia Walls Virginia Cordner, LCSW 06/14/2020

## 2020-06-27 ENCOUNTER — Telehealth (INDEPENDENT_AMBULATORY_CARE_PROVIDER_SITE_OTHER): Payer: 59 | Admitting: Psychiatry

## 2020-06-27 ENCOUNTER — Other Ambulatory Visit: Payer: Self-pay

## 2020-06-27 ENCOUNTER — Encounter: Payer: Self-pay | Admitting: Psychiatry

## 2020-06-27 DIAGNOSIS — Z634 Disappearance and death of family member: Secondary | ICD-10-CM

## 2020-06-27 DIAGNOSIS — F333 Major depressive disorder, recurrent, severe with psychotic symptoms: Secondary | ICD-10-CM

## 2020-06-27 DIAGNOSIS — F411 Generalized anxiety disorder: Secondary | ICD-10-CM

## 2020-06-27 NOTE — Progress Notes (Signed)
Provider Location : [facility] Patient Location : Home  Participants: Patient , Provider  Virtual Visit via Video Note  I connected with Virginia Walls on 06/27/20 at  4:30 PM EDT by a video enabled telemedicine application and verified that I am speaking with the correct person using two identifiers.   I discussed the limitations of evaluation and management by telemedicine and the availability of in person appointments. The patient expressed understanding and agreed to proceed.     I discussed the assessment and treatment plan with the patient. The patient was provided an opportunity to ask questions and all were answered. The patient agreed with the plan and demonstrated an understanding of the instructions.   The patient was advised to call back or seek an in-person evaluation if the symptoms worsen or if the condition fails to improve as anticipated. [facility] MD OP Progress Note  06/27/2020 4:58 PM Virginia Walls  MRN:  [id number]  Chief Complaint:  Chief Complaint    Follow-up     HPI: Virginia Walls is a 39 year old African-American female, currently on a leave of absence from work, lives in [place], has a history of depression, bereavement, migraine headaches, restless leg syndrome, Achilles tendinitis was evaluated by telemedicine today.  Patient today reports she continues to grieve the loss of her son who passed away from leukemia in 02/05/20.  She reports she continues to have sadness, social withdrawal, tearfulness on and off.  She reports she is trying to spend time with her family members like her mother and did go out couple of times recently.  She however reports she often feels withdrawn and does not want to spend a lot of time with other people.  She also reports she has not been compliant with her medications.  She stopped taking the Lexapro.  She also has been using the mirtazapine only as needed and reports it makes her groggy and hence she did not take it much.  She  reports sleep as restless on and off.  She continues to struggle with her appetite.  Patient denies any suicidality, homicidality or perceptual disturbances.Patient currently denies any psychosis, denies any visual or auditory hallucinations.  She did not appear to be delusional or paranoid.  Patient reports relationship struggles with her husband.  She is currently in psychotherapy sessions and meets with her therapist every 2 weeks.  She reports she is planning to go back to work on September 17.  She however reports she does not want to work in a high stress environment and would like a position with her company where she can do low stress work for now.  She reports she believes that will help her better to focus on her work since she is struggling with depressive symptoms as well as grief.   Patient denies any other concerns today.   Visit Diagnosis:    ICD-10-CM   1. MDD (major depressive disorder), recurrent, severe, with psychosis (HCC)  F33.3   2. Bereavement  Z63.4   3. GAD (generalized anxiety disorder)  F41.1     Past Psychiatric History: I have reviewed past psychiatric history from my progress note on 04/11/2020.  Past trials of Lexapro, Abilify  Past Medical History:  Past Medical History:  Diagnosis Date  . Back pain   . Depression   . Migraine   . Palpitation   . Panic disorder   . RLS (restless legs syndrome)     Past Surgical History:  Procedure Laterality Date  .  ABDOMINAL HYSTERECTOMY    . TUBAL LIGATION      Family Psychiatric History: I have reviewed family psychiatric history from my progress note on 04/11/2020  Family History:  Family History  Problem Relation Age of Onset  . Heart disease Mother   . Asthma Mother   . Hypothyroidism Mother   . Bipolar disorder Mother   . Hypothyroidism Sister   . Diabetes Sister   . Hyperlipidemia Sister   . Asthma Son   . Heart disease Paternal Grandmother        Pacemaker  . Leukemia Paternal Grandmother    . Diabetes Paternal Grandmother   . Bipolar disorder Maternal Aunt   . Bipolar disorder Maternal Uncle     Social History: Reviewed social history from my progress note on 04/11/2020 Social History   Socioeconomic History  . Marital status: Single    Spouse name: Not on file  . Number of children: 3  . Years of education: Not on file  . Highest education level: Not on file  Occupational History  . Occupation: Energy manager   Tobacco Use  . Smoking status: Current Some Day Smoker    Packs/day: 0.25    Years: 4.00    Pack years: 1.00    Types: Cigarettes    Start date: 05/04/2011  . Smokeless tobacco: Never Used  Vaping Use  . Vaping Use: Never used  Substance and Sexual Activity  . Alcohol use: Yes    Alcohol/week: 0.0 standard drinks    Comment: occasionally  . Drug use: No  . Sexual activity: Yes    Partners: Male  Other Topics Concern  . Not on file  Social History Narrative   Lives with same person / boyfriend for the past 18 years   She has three son. 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    Youngest sone died at age 12 from Leukemia    Social Determinants of Health   Financial Resource Strain:   . Difficulty of Paying Living Expenses: Not on file  Food Insecurity:   . Worried About Programme researcher, broadcasting/film/video in the Last Year: Not on file  . Ran Out of Food in the Last Year: Not on file  Transportation Needs:   . Lack of Transportation (Medical): Not on file  . Lack of Transportation (Non-Medical): Not on file  Physical Activity:   . Days of Exercise per Week: Not on file  . Minutes of Exercise per Session: Not on file  Stress:   . Feeling of Stress : Not on file  Social Connections:   . Frequency of Communication with Friends and Family: Not on file  . Frequency of Social Gatherings with Friends and Family: Not on file  . Attends Religious Services: Not on file  . Active Member of  Clubs or Organizations: Not on file  . Attends Banker Meetings: Not on file  . Marital Status: Not on file    Allergies:  Allergies  Allergen Reactions  . Corylus   . Hydrocodone     Metabolic Disorder Labs: Lab Results  Component Value Date   HGBA1C 5.2 04/14/2020   MPG 105 07/24/2017   Lab Results  Component Value Date   PROLACTIN 21.7 04/14/2020   Lab Results  Component Value Date   CHOL 208 (H) 04/14/2020   TRIG 90 04/14/2020   HDL 61 04/14/2020   CHOLHDL 3.4 04/14/2020  LDLCALC 131 (H) 04/14/2020   LDLCALC 125 (H) 07/24/2017   Lab Results  Component Value Date   TSH 1.090 04/14/2020   TSH 1.05 07/24/2017    Therapeutic Level Labs: No results found for: LITHIUM No results found for: VALPROATE No components found for:  CBMZ  Current Medications: Current Outpatient Medications  Medication Sig Dispense Refill  . ARIPiprazole (ABILIFY) 10 MG tablet TAKE 1 TABLET BY MOUTH EVERY DAY 30 tablet 1  . clonazePAM (KLONOPIN) 1 MG tablet Take 0.5-1 tablets (0.5-1 mg total) by mouth as directed. Start taking half tablet daily during the day as needed for anxiety attacks and one tablet at bedtime as needed for sleep and anxiety- please limit use 45 tablet 1  . mirtazapine (REMERON) 15 MG tablet TAKE 0.5-1 TABLETS (7.5-15 MG TOTAL) BY MOUTH AT BEDTIME. SLEEP AND MOOD 30 tablet 1  . Rimegepant Sulfate (NURTEC) 75 MG TBDP Take 1 tablet by mouth every other day. 16 tablet 2  . topiramate (TOPAMAX) 100 MG tablet Take 1 tablet (100 mg total) by mouth 2 (two) times daily. Max of 100  Mg twice daily 180 tablet 0  . Vitamin D, Ergocalciferol, (DRISDOL) 1.25 MG (50000 UNIT) CAPS capsule Take 1 capsule (50,000 Units total) by mouth every 7 (seven) days. 12 capsule 0   No current facility-administered medications for this visit.     Musculoskeletal: Strength & Muscle Tone: UTA Gait & Station: normal Patient leans: N/A  Psychiatric Specialty Exam: Review of  Systems  Psychiatric/Behavioral: Positive for dysphoric mood and sleep disturbance.  All other systems reviewed and are negative.   There were no vitals taken for this visit.There is no height or weight on file to calculate BMI.  General Appearance: Casual  Eye Contact:  Fair  Speech:  Clear and Coherent  Volume:  Normal  Mood:  Depressed  Affect:  Tearful  Thought Process:  Goal Directed and Descriptions of Associations: Intact  Orientation:  Full (Time, Place, and Person)  Thought Content: Rumination   Suicidal Thoughts:  No  Homicidal Thoughts:  No  Memory:  Immediate;   Fair Recent;   Fair Remote;   Fair  Judgement:  Fair  Insight:  Fair  Psychomotor Activity:  Normal  Concentration:  Concentration: Fair and Attention Span: Fair  Recall:  [employer] of Knowledge: Fair  Language: Fair  Akathisia:  No  Handed:  Right  AIMS (if indicated): UTA  Assets:  Communication Skills Desire for Improvement Housing Social Support  ADL's:  Intact  Cognition: WNL  Sleep:  Restless   Screenings: GAD-7     Counselor from 05/18/2020 in [facility] Office Visit from 03/15/2020 in [facility] Office Visit from 02/02/2020 in [facility] Office Visit from 07/31/2017 in [facility]  Total GAD-7 Score 21 21 21 3     PHQ2-9     Office Visit from 06/13/2020 in [facility] Counselor from 05/18/2020 in [facility] Office Visit from 03/15/2020 in [facility] Office Visit from 02/02/2020 in [facility] Office Visit from 08/27/2017 in [facility]  PHQ-2 Total Score 6 5 6 5  0  PHQ-9 Total Score 22 20 26 24  --       Assessment and Plan: TAISIA ROBITAILLE is a 72 year old African-American female, lives with her boyfriend and her son's in [place], has a history of depression, anxiety, Achilles tendinitis, was  evaluated by telemedicine today.  Patient is biologically predisposed due to her trauma, history of mental health problems in her family.  Patient with psychosocial stressors of the death of her 65 year old son recently.  Patient continues to struggle with depressive symptoms, grief as well as is noncompliant with medications due to side effects.  Discussed plan as noted below.  Plan MDD-some progress Abilify 10 mg p.o. daily Discontinue Lexapro for noncompliance Discussed with patient to restart mirtazapine, 15 mg p.o. nightly.  Advised patient to take it every night.  Advised patient to give writer a call if the mirtazapine gives her side effects of grogginess.  Discussed with her that the dosage can be readjusted to help with the same. Continue CBT with Ms.Hussami  GAD-some progress Klonopin 0.5 mg p.o. daily as needed for severe anxiety, 1 mg p.o. nightly. Patient advised to limit use.  She is aware about long-term benzodiazepine therapy risk. Mirtazapine 15 mg p.o. nightly Continue CBT  Bereavement-some progress Klonopin as prescribed Continue grief counseling  High risk medication use-reviewed labs-lipid panel, hemoglobin A1c, prolactin, TSH, CBC with differential-within normal limits except lipid panel-abnormal.  She will continue to follow-up with her primary care provider for the same.  At risk for QT syndrome-EKG dated 06/13/2020 reviewed-within normal limits  Patient will benefit from a low stress work environment due to her current mood symptoms as well as adverse side effects to medications.  Follow-up in clinic in 3 to 4 weeks or sooner if needed.  I have spent atleast 20 minutes face to face with patient today. More than 50 % of the time was spent for preparing to see the patient ( e.g., review of test, records ), obtaining and to review and separately obtained history , ordering medications and test ,psychoeducation and supportive psychotherapy and care coordination,as well  as documenting clinical information in electronic health record. This note was generated in part or whole with voice recognition software. Voice recognition is usually quite accurate but there are transcription errors that can and very often do occur. I apologize for any typographical errors that were not detected and corrected.       Jomarie Longs, MD 06/28/2020, 8:12 AM

## 2020-06-28 ENCOUNTER — Ambulatory Visit (INDEPENDENT_AMBULATORY_CARE_PROVIDER_SITE_OTHER): Payer: 59 | Admitting: Licensed Clinical Social Worker

## 2020-06-28 DIAGNOSIS — F333 Major depressive disorder, recurrent, severe with psychotic symptoms: Secondary | ICD-10-CM | POA: Diagnosis not present

## 2020-06-28 DIAGNOSIS — F411 Generalized anxiety disorder: Secondary | ICD-10-CM

## 2020-06-28 DIAGNOSIS — Z634 Disappearance and death of family member: Secondary | ICD-10-CM | POA: Diagnosis not present

## 2020-06-28 NOTE — Progress Notes (Signed)
Virtual Visit via Video Note  I connected with Virginia Walls on 06/28/20 at 11:00 AM EDT by a video enabled telemedicine application and verified that I am speaking with the correct person using two identifiers.  Location: Patient: home Provider: remote office [place], [place])   I discussed the limitations of evaluation and management by telemedicine and the availability of in person appointments. The patient expressed understanding and agreed to proceed.  The patient was advised to call back or seek an in-person evaluation if the symptoms worsen or if the condition fails to improve as anticipated.  I provided 45 minutes of non-face-to-face time during this encounter.   Virginia R Hussami, LCSW  THERAPIST PROGRESS NOTE  Session Time: 11:00-11:45 am  Participation Level: Active  Behavioral Response: Neat and Well GroomedAlertDepressed  Type of Therapy: Individual Therapy  Treatment Goals addressed: Coping  Interventions: Supportive and Other: grief counseling  Summary: Virginia Walls is a 39 y.o. female who presents with continuing symptoms related to her diagnoses of depression, anxiety, and bereavement. Pt reports that her mood is fluctuating, sleep quality is inconsistent, and that appetite is fluctuating. Pt reports that she is compliant with medication.  Allowed pt to explore and express thoughts and feelings about external stressors that have happened since last session. Virginia Walls reports that she is feeling lots of stress surrounding her return to work.  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 Pt feels that there are other jobs that are remote that are not phone related. Discussed stress triggers: returning to work full time.  Pt is worried about going from temp disability to full time work--discussed  possible transition time. Encouraged pt to discuss with HR at her job to figure out options. Pt agreed and wants to be more certain of what she will be going back to once she starts back to work.   Processed through thoughts/feelings associated with bereavement.  Suicidal/Homicidal: No  SI, HI, or AVH reported at time of session.  Therapist Response: Virginia Walls reports an increase in overall anxiety symptoms as she is thinking about returning to work. Virginia Walls reports a situational escalation in depression symptoms since last week due to situational stressor (late son's birthday).  This is reflective of intermittent/fluctuating progress.   Plan: Return again in 3 weeks. The ongoing treatment plan includes continuing with mood management, stress/anxiety management, and grief management.   Diagnosis: Axis I: Bereavement, Generalized Anxiety Disorder and Major Depression, Recurrent severe    Axis II: No diagnosis    Virginia Haber Hussami, LCSW 06/28/2020

## 2020-06-30 ENCOUNTER — Ambulatory Visit: Payer: Managed Care, Other (non HMO) | Admitting: Podiatry

## 2020-07-05 ENCOUNTER — Telehealth: Payer: Self-pay

## 2020-07-05 ENCOUNTER — Encounter: Payer: Self-pay | Admitting: Psychiatry

## 2020-07-05 NOTE — Telephone Encounter (Signed)
pt called left message that she needs a release to go back to work please send her a email and fax to [email or web address]

## 2020-07-05 NOTE — Telephone Encounter (Signed)
I have completed the letter. Please fax to [employer] and email to patient.

## 2020-07-07 ENCOUNTER — Other Ambulatory Visit: Payer: Self-pay | Admitting: Family Medicine

## 2020-07-07 DIAGNOSIS — E559 Vitamin D deficiency, unspecified: Secondary | ICD-10-CM

## 2020-07-14 ENCOUNTER — Ambulatory Visit: Payer: Managed Care, Other (non HMO) | Admitting: Podiatry

## 2020-07-19 ENCOUNTER — Other Ambulatory Visit: Payer: Self-pay

## 2020-07-19 ENCOUNTER — Ambulatory Visit (INDEPENDENT_AMBULATORY_CARE_PROVIDER_SITE_OTHER): Payer: 59 | Admitting: Licensed Clinical Social Worker

## 2020-07-19 DIAGNOSIS — F333 Major depressive disorder, recurrent, severe with psychotic symptoms: Secondary | ICD-10-CM

## 2020-07-19 DIAGNOSIS — Z634 Disappearance and death of family member: Secondary | ICD-10-CM | POA: Diagnosis not present

## 2020-07-19 NOTE — Addendum Note (Signed)
Addended by: Rozanna Box on: 07/19/2020 05:06 PM   Modules accepted: Level of Service

## 2020-07-19 NOTE — Progress Notes (Addendum)
Virtual Visit via Video Note  I connected with Virginia Walls on 07/19/20 at  2:30 PM EDT by a video enabled telemedicine application and verified that I am speaking with the correct person using two identifiers.  Location: Patient: home Provider: remote office [place], [place])   I discussed the limitations of evaluation and management by telemedicine and the availability of in person appointments. The patient expressed understanding and agreed to proceed.   The patient was advised to call back or seek an in-person evaluation if the symptoms worsen or if the condition fails to improve as anticipated.  I provided 38 minutes of non-face-to-face time during this encounter.   Dwon Sky R Dorisann Schwanke, LCSW    THERAPIST PROGRESS NOTE  Session Time: 2:30-3:38p  Participation Level: Active  Behavioral Response: Neat and Well GroomedAlertAnxious  Type of Therapy: Individual Therapy  Treatment Goals addressed: Coping  Interventions: Strength-based and Supportive  Summary: Virginia Walls is a 39 y.o. female who presents with improving symptoms related to diagnosis (depression, grief).  Pt reports that she recently transitioned back to the workplace and although she was initially dreading it--the return to work has filled pt with a sense of normalcy. Pt reports that she is compliant with medications, she is resting well, and trying to engage socially with family members. Pt does admit that she has been withdrawing from husband.  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   Discussed pts relationship with husband and how she feels they are drifting apart. Pt admits that she is often the one withdrawing. Used perspective-shifting to allow pt to see through lens of husband, and how that could be impacting him  psychologically. Discussed changes--pt cannot make changes in her husband but can make changes if she chooses to help repair relationship. Encouraged pt to spend time within physical proximity of partner at least daily.  Encouraged pt to take it slow and only at her comfort level.  Discussed "bad days" when pt may be more engulfed by grief. Encouraged pt to allow herself to sit with the grief, then engage in self-care immediately afterwards. Pt does admit that the "breakdowns" are happening less frequently, which is a sign of progress.  Continued to encourage self care, life balance, and engaging with loved ones.  Suicidal/Homicidal: No  SI, HI, or AVH reported at time of session.  Therapist Response: Virginia Walls presented with a more positive affect and smiled more during this session than LCSW counselor has seen in previous sessions. Virginia Walls is demonstrating a greater capacity to accept and move forward through the grieving process, which is indicative of overall progress.  Plan: Return again in 4 weeks.The ongoing treatment plan includes maintaining current levels of progress and continuing to build skills to manage mood, improve stress/anxiety management, grief counseling, emotion regulation, distress tolerance, and behavior modification.   Diagnosis: Axis I: Bereavement, Generalized Anxiety Disorder and Major Depression, Recurrent severe    Axis II: No diagnosis   Ernest Haber Bassel Gaskill, LCSW 07/19/2020

## 2020-08-02 NOTE — Progress Notes (Signed)
Name: Virginia Walls   MRN: [id number]    DOB: 1981-04-19   Date:08/03/2020       Progress Note  Subjective  Chief Complaint  Follow up   HPI   Grief/Major depression recurrent: she states first episode of depression was after the birth of her second son she was given medications but was not compliant with it, she states she never felt back to normal, she states keeps things to herself and cannot handle stress well, she states she was depressed before her son got the diagnosis of leukemia back in 10-29-2019 and died on 02/13/20 .Marland Kitchen  She states episodes in the past  Were never as bad  She has been on FMLA since he was admitted to [facility] 2019-10-29. She was seen in April 2021  and had been  crying daily for the previous month. She was having  poor appetite, could not  sleep at night, had suicidal thoughts but no planning. We started her on Lexapro but symptoms did not improve and she started to have visual hallucinations.  She was referred to  Dr. Elna Breslow first appointment June and is also seeing a therapist every 2 weeks. She is off Lexapro and taking Abilify 10 mg and Remeron at night  She is able to care for her hygiene, she has poor appetite but makes herself eat. She was released back to work September 2021, she states she is doing the same job but getting re-trained because she was gone for a long time. She works Pension scheme manager and not sure she can handle the stress of being yelled at    Migraine: she states pain can start on nuchal area or frontal area and radiates to opposite direction. Described as pounding, sharp associated with dizziness, photophobia, no nausea or vomiting. She was seen in August and at the time she was taking Excedrin migraine almost daily, we increased dose of Topamax to 100 mg bid and added Nurteq , she is now taking Excedrin migraine at most once a week, she states Nurteq has helped.   Vitamin D def: she is taking vitamin D supplementation now   Patient Active Problem  List   Diagnosis Date Noted  . MDD (major depressive disorder), recurrent, severe, with psychosis (HCC) 04/11/2020  . GAD (generalized anxiety disorder) 04/11/2020  . Bereavement 04/11/2020  . At risk for prolonged QT interval syndrome 04/11/2020  . High risk medication use 04/11/2020  . Anxiety 11/13/2017  . Chronic constipation 08/12/2017  . Hyperglycemia 05/04/2015  . Tattoos 05/04/2015  . Headache, migraine 04/29/2015  . Excess weight 04/29/2015  . Restless leg 04/29/2015  . Snores 04/29/2015  . Vitamin D deficiency 04/29/2015    Past Surgical History:  Procedure Laterality Date  . ABDOMINAL HYSTERECTOMY    . TUBAL LIGATION      Family History  Problem Relation Age of Onset  . Heart disease Mother   . Asthma Mother   . Hypothyroidism Mother   . Bipolar disorder Mother   . Hypothyroidism Sister   . Diabetes Sister   . Hyperlipidemia Sister   . Asthma Son   . Heart disease Paternal Grandmother        Pacemaker  . Leukemia Paternal Grandmother   . Diabetes Paternal Grandmother   . Bipolar disorder Maternal Aunt   . Bipolar disorder Maternal Uncle     Social History   Tobacco Use  . Smoking status: Current Some Day Smoker    Packs/day: 0.25  Years: 4.00    Pack years: 1.00    Types: Cigarettes    Start date: 05/04/2011  . Smokeless tobacco: Never Used  Substance Use Topics  . Alcohol use: Yes    Alcohol/week: 0.0 standard drinks    Comment: occasionally     Current Outpatient Medications:  .  ARIPiprazole (ABILIFY) 10 MG tablet, TAKE 1 TABLET BY MOUTH EVERY DAY, Disp: 30 tablet, Rfl: 1 .  clonazePAM (KLONOPIN) 1 MG tablet, Take 0.5-1 tablets (0.5-1 mg total) by mouth as directed. Start taking half tablet daily during the day as needed for anxiety attacks and one tablet at bedtime as needed for sleep and anxiety- please limit use, Disp: 45 tablet, Rfl: 1 .  mirtazapine (REMERON) 15 MG tablet, TAKE 0.5-1 TABLETS (7.5-15 MG TOTAL) BY MOUTH AT BEDTIME. SLEEP  AND MOOD, Disp: 30 tablet, Rfl: 1 .  Rimegepant Sulfate (NURTEC) 75 MG TBDP, Take 1 tablet by mouth every other day., Disp: 16 tablet, Rfl: 2 .  topiramate (TOPAMAX) 100 MG tablet, Take 1 tablet (100 mg total) by mouth 2 (two) times daily. Max of 100  Mg twice daily, Disp: 180 tablet, Rfl: 0 .  Vitamin D, Ergocalciferol, (DRISDOL) 1.25 MG (50000 UNIT) CAPS capsule, TAKE 1 CAPSULE (50,000 UNITS TOTAL) BY MOUTH EVERY 7 (SEVEN) DAYS. (NOT COVERED), Disp: 12 capsule, Rfl: 0  Allergies  Allergen Reactions  . Corylus   . Hydrocodone     I personally reviewed active problem list, medication list, allergies, family history, social history, health maintenance with the patient/caregiver today.   ROS  Constitutional: Negative for fever or weight change.  Respiratory: Negative for cough and shortness of breath.   Cardiovascular: Negative for chest pain or palpitations.  Gastrointestinal: Negative for abdominal pain, no bowel changes.  Musculoskeletal: Negative for gait problem or joint swelling.  Skin: Negative for rash.  Neurological: Negative for dizziness, positive for  headache.  No other specific complaints in a complete review of systems (except as listed in HPI above).  Objective  Vitals:   08/03/20 1027  BP: 112/76  Pulse: 79  Resp: 14  Temp: 98.4 F (36.9 C)  TempSrc: Oral  SpO2: 100%  Weight: 161 lb (73 kg)  Height: 5\' 4"  (1.626 m)    Body mass index is 27.64 kg/m.  Physical Exam  Constitutional: Patient appears well-developed and well-nourished. Overweight. No distress.  HEENT: head atraumatic, normocephalic, pupils equal and reactive to light,  neck supple Cardiovascular: Normal rate, regular rhythm and normal heart sounds.  No murmur heard. No BLE edema. Pulmonary/Chest: Effort normal and breath sounds normal. No respiratory distress. Abdominal: Soft.  There is no tenderness. Psychiatric: crying, sad   Recent Results (from the past 2160 hour(s))  Hepatitis C  Antibody     Status: None   Collection Time: 06/13/20  2:10 PM  Result Value Ref Range   Hepatitis C Ab NON-REACTIVE NON-REACTI   SIGNAL TO CUT-OFF 0.01 <1.00    Comment: . HCV antibody was non-reactive. There is no laboratory  evidence of HCV infection. . In most cases, no further action is required. However, if recent HCV exposure is suspected, a test for HCV RNA (test code 06/15/20) is suggested. . For additional information please refer to http://education.[facility].com/faq/FAQ22v1 (This link is being provided for informational/ educational purposes only.) .      PHQ2/9: Depression screen [facility] 2/9 08/03/2020 06/13/2020 03/15/2020 02/02/2020 08/27/2017  Decreased Interest 2 3 3 2  0  Down, Depressed, Hopeless 2 3 3 3  0  PHQ - 2 Score 4 6 6 5  0  Altered sleeping 2 3 3 3  -  Tired, decreased energy 2 3 3 3  -  Change in appetite 2 3 3 3  -  Feeling bad or failure about yourself  2 2 3 3  -  Trouble concentrating 2 2 3 3  -  Moving slowly or fidgety/restless 2 3 3 3  -  Suicidal thoughts 1 0 2 1 -  PHQ-9 Score 17 22 26 24  -  Difficult doing work/chores Very difficult Somewhat difficult Extremely dIfficult Extremely dIfficult -  Some encounter information is confidential and restricted. Go to Review Flowsheets activity to see all data.    phq 9 is positive   Fall Risk: Fall Risk  08/03/2020 06/13/2020 03/15/2020 02/02/2020 08/27/2017  Falls in the past year? 1 1 0 0 No  Number falls in past yr: 1 1 0 0 -  Injury with Fall? 1 1 0 0 -  Risk for fall due to : History of fall(s) - - - -     Functional Status Survey: Is the patient deaf or have difficulty hearing?: No Does the patient have difficulty seeing, even when wearing glasses/contacts?: No Does the patient have difficulty concentrating, remembering, or making decisions?: Yes Does the patient have difficulty walking or climbing stairs?: No Does the patient have difficulty dressing or bathing?: No Does the patient have  difficulty doing errands alone such as visiting a doctor's office or shopping?: No    Assessment & Plan  1. MDD (major depressive disorder), recurrent, severe, with psychosis (HCC)  Back to work, still overwhelmed but seems to be gradually improving   2. Migraine without aura and without status migrainosus, not intractable  Continue medication   3. Vitamin D deficiency  Continue supplementation

## 2020-08-03 ENCOUNTER — Encounter: Payer: Self-pay | Admitting: Family Medicine

## 2020-08-03 ENCOUNTER — Ambulatory Visit: Payer: Managed Care, Other (non HMO) | Admitting: Family Medicine

## 2020-08-03 ENCOUNTER — Other Ambulatory Visit: Payer: Self-pay

## 2020-08-03 VITALS — BP 112/76 | HR 79 | Temp 98.4°F | Resp 14 | Ht 64.0 in | Wt 161.0 lb

## 2020-08-03 DIAGNOSIS — G43009 Migraine without aura, not intractable, without status migrainosus: Secondary | ICD-10-CM

## 2020-08-03 DIAGNOSIS — F333 Major depressive disorder, recurrent, severe with psychotic symptoms: Secondary | ICD-10-CM

## 2020-08-03 DIAGNOSIS — E559 Vitamin D deficiency, unspecified: Secondary | ICD-10-CM | POA: Diagnosis not present

## 2020-08-07 ENCOUNTER — Other Ambulatory Visit: Payer: Self-pay

## 2020-08-07 ENCOUNTER — Telehealth (INDEPENDENT_AMBULATORY_CARE_PROVIDER_SITE_OTHER): Payer: Managed Care, Other (non HMO) | Admitting: Psychiatry

## 2020-08-07 ENCOUNTER — Encounter: Payer: Self-pay | Admitting: Psychiatry

## 2020-08-07 DIAGNOSIS — F411 Generalized anxiety disorder: Secondary | ICD-10-CM | POA: Diagnosis not present

## 2020-08-07 DIAGNOSIS — F333 Major depressive disorder, recurrent, severe with psychotic symptoms: Secondary | ICD-10-CM

## 2020-08-07 DIAGNOSIS — Z634 Disappearance and death of family member: Secondary | ICD-10-CM | POA: Diagnosis not present

## 2020-08-07 MED ORDER — ARIPIPRAZOLE 10 MG PO TABS: 10.0000 mg | ORAL_TABLET | Freq: Every day | ORAL | 1 refills | Status: DC

## 2020-08-07 MED ORDER — VENLAFAXINE HCL ER 37.5 MG PO CP24: 37.5000 mg | ORAL_CAPSULE | Freq: Every day | ORAL | 1 refills | Status: DC

## 2020-08-07 MED ORDER — MIRTAZAPINE 15 MG PO TABS: 7.5000 mg | ORAL_TABLET | Freq: Every day | ORAL | 1 refills | Status: DC

## 2020-08-07 NOTE — Patient Instructions (Signed)
Venlafaxine extended-release capsules What is this medicine? VENLAFAXINE(VEN la fax een) is used to treat depression, anxiety and panic disorder. This medicine may be used for other purposes; ask your health care provider or pharmacist if you have questions. COMMON BRAND NAME(S): Effexor XR What should I tell my health care provider before I take this medicine? They need to know if you have any of these conditions:  bleeding disorders  glaucoma  heart disease  high blood pressure  high cholesterol  kidney disease  liver disease  low levels of sodium in the blood  mania or bipolar disorder  seizures  suicidal thoughts, plans, or attempt; a previous suicide attempt by you or a family  take medicines that treat or prevent blood clots  thyroid disease  an unusual or allergic reaction to venlafaxine, desvenlafaxine, other medicines, foods, dyes, or preservatives  pregnant or trying to get pregnant  breast-feeding How should I use this medicine? Take this medicine by mouth with a full glass of water. Follow the directions on the prescription label. Do not cut, crush, or chew this medicine. Take it with food. If needed, the capsule may be carefully opened and the entire contents sprinkled on a spoonful of cool applesauce. Swallow the applesauce/pellet mixture right away without chewing and follow with a glass of water to ensure complete swallowing of the pellets. Try to take your medicine at about the same time each day. Do not take your medicine more often than directed. Do not stop taking this medicine suddenly except upon the advice of your doctor. Stopping this medicine too quickly may cause serious side effects or your condition may worsen. A special MedGuide will be given to you by the pharmacist with each prescription and refill. Be sure to read this information carefully each time. Talk to your pediatrician regarding the use of this medicine in children. Special care may be  needed. Overdosage: If you think you have taken too much of this medicine contact a poison control center or emergency room at once. NOTE: This medicine is only for you. Do not share this medicine with others. What if I miss a dose? If you miss a dose, take it as soon as you can. If it is almost time for your next dose, take only that dose. Do not take double or extra doses. What may interact with this medicine? Do not take this medicine with any of the following medications:  certain medicines for fungal infections like fluconazole, itraconazole, ketoconazole, posaconazole, voriconazole  cisapride  desvenlafaxine  dronedarone  duloxetine  levomilnacipran  linezolid  MAOIs like Carbex, Eldepryl, Marplan, Nardil, and Parnate  methylene blue (injected into a vein)  milnacipran  pimozide  thioridazine This medicine may also interact with the following medications:  amphetamines  aspirin and aspirin-like medicines  certain medicines for depression, anxiety, or psychotic disturbances  certain medicines for migraine headaches like almotriptan, eletriptan, frovatriptan, naratriptan, rizatriptan, sumatriptan, zolmitriptan  certain medicines for sleep  certain medicines that treat or prevent blood clots like dalteparin, enoxaparin, warfarin  cimetidine  clozapine  diuretics  fentanyl  furazolidone  indinavir  isoniazid  lithium  metoprolol  NSAIDS, medicines for pain and inflammation, like ibuprofen or naproxen  other medicines that prolong the QT interval (cause an abnormal heart rhythm) like dofetilide, ziprasidone  procarbazine  rasagiline  supplements like St. John's wort, kava kava, valerian  tramadol  tryptophan This list may not describe all possible interactions. Give your health care provider a list of all the medicines,   herbs, non-prescription drugs, or dietary supplements you use. Also tell them if you smoke, drink alcohol, or use illegal  drugs. Some items may interact with your medicine. What should I watch for while using this medicine? Tell your doctor if your symptoms do not get better or if they get worse. Visit your doctor or health care professional for regular checks on your progress. Because it may take several weeks to see the full effects of this medicine, it is important to continue your treatment as prescribed by your doctor. Patients and their families should watch out for new or worsening thoughts of suicide or depression. Also watch out for sudden changes in feelings such as feeling anxious, agitated, panicky, irritable, hostile, aggressive, impulsive, severely restless, overly excited and hyperactive, or not being able to sleep. If this happens, especially at the beginning of treatment or after a change in dose, call your health care professional. This medicine can cause an increase in blood pressure. Check with your doctor for instructions on monitoring your blood pressure while taking this medicine. You may get drowsy or dizzy. Do not drive, use machinery, or do anything that needs mental alertness until you know how this medicine affects you. Do not stand or sit up quickly, especially if you are an older patient. This reduces the risk of dizzy or fainting spells. Alcohol may interfere with the effect of this medicine. Avoid alcoholic drinks. Your mouth may get dry. Chewing sugarless gum, sucking hard candy and drinking plenty of water will help. Contact your doctor if the problem does not go away or is severe. What side effects may I notice from receiving this medicine? Side effects that you should report to your doctor or health care professional as soon as possible:  allergic reactions like skin rash, itching or hives, swelling of the face, lips, or tongue  anxious  breathing problems  confusion  changes in vision  chest pain  confusion  elevated mood, decreased need for sleep, racing thoughts, impulsive  behavior  eye pain  fast, irregular heartbeat  feeling faint or lightheaded, falls  feeling agitated, angry, or irritable  hallucination, loss of contact with reality  high blood pressure  loss of balance or coordination  palpitations  redness, blistering, peeling or loosening of the skin, including inside the mouth  restlessness, pacing, inability to keep still  seizures  stiff muscles  suicidal thoughts or other mood changes  trouble passing urine or change in the amount of urine  trouble sleeping  unusual bleeding or bruising  unusually weak or tired  vomiting Side effects that usually do not require medical attention (report to your doctor or health care professional if they continue or are bothersome):  change in sex drive or performance  change in appetite or weight  constipation  dizziness  dry mouth  headache  increased sweating  nausea  tired This list may not describe all possible side effects. Call your doctor for medical advice about side effects. You may report side effects to FDA at 1-800-FDA-1088. Where should I keep my medicine? Keep out of the reach of children. Store at a controlled temperature between 20 and 25 degrees C (68 degrees and 77 degrees F), in a dry place. Throw away any unused medicine after the expiration date. NOTE: This sheet is a summary. It may not cover all possible information. If you have questions about this medicine, talk to your doctor, pharmacist, or health care provider.  2020 [employer]vier/Gold Standard (2018-09-29 12:06:43)  

## 2020-08-07 NOTE — Progress Notes (Signed)
Provider Location : [facility] Patient Location : Home  Participants: Patient , Provider  Virtual Visit via Video Note  I connected with [name] on 08/07/20 at 10:00 AM EDT by a video enabled telemedicine application and verified that I am speaking with the correct person using two identifiers.   I discussed the limitations of evaluation and management by telemedicine and the availability of in person appointments. The patient expressed understanding and agreed to proceed.   I discussed the assessment and treatment plan with the patient. The patient was provided an opportunity to ask questions and all were answered. The patient agreed with the plan and demonstrated an understanding of the instructions.   The patient was advised to call back or seek an in-person evaluation if the symptoms worsen or if the condition fails to improve as anticipated.   BH MD OP Progress Note  08/07/2020 12:54 PM MONNA SHIELS  MRN:  [id number]  Chief Complaint:  Chief Complaint    Follow-up     HPI: Virginia Walls is a 39 year old African-American female, currently back at work, lives in [place], married, has a history of depression, bereavement, migraine headaches, restless leg syndrome, Achilles tendinitis was evaluated by telemedicine today.  Patient today reports she is currently back at work and is in training.  She reports work continues to be stressful.  She however is taking it one day at a time.  She continues to struggle with depressive symptoms like sadness, lack of motivation, social withdrawal, reduced appetite.  She also reports sleep problems however reports that is improving on the mirtazapine which she currently takes on a daily basis.  Patient reports therapy sessions as beneficial.  She continues to grieve the loss of her son however reports the therapy sessions are helpful .  Patient denies any suicidality, homicidality or perceptual disturbances.  Patient denies any other  concerns today.  Visit Diagnosis:    ICD-10-CM   1. MDD (major depressive disorder), recurrent, severe, with psychosis (HCC)  F33.3 venlafaxine XR (EFFEXOR-XR) 37.5 MG 24 hr capsule    mirtazapine (REMERON) 15 MG tablet    ARIPiprazole (ABILIFY) 10 MG tablet  2. Bereavement  Z63.4 mirtazapine (REMERON) 15 MG tablet  3. GAD (generalized anxiety disorder)  F41.1 venlafaxine XR (EFFEXOR-XR) 37.5 MG 24 hr capsule    mirtazapine (REMERON) 15 MG tablet    Past Psychiatric History: I have reviewed past psychiatric history from my progress note from 04/11/2020.  Past trials of Lexapro, Abilify  Past Medical History:  Past Medical History:  Diagnosis Date  . Back pain   . Depression   . Migraine   . Palpitation   . Panic disorder   . RLS (restless legs syndrome)     Past Surgical History:  Procedure Laterality Date  . ABDOMINAL HYSTERECTOMY    . TUBAL LIGATION      Family Psychiatric History: I have reviewed family psychiatric history from my progress note on 04/11/2020  Family History:  Family History  Problem Relation Age of Onset  . Heart disease Mother   . Asthma Mother   . Hypothyroidism Mother   . Bipolar disorder Mother   . Hypothyroidism Sister   . Diabetes Sister   . Hyperlipidemia Sister   . Asthma Son   . Heart disease Paternal Grandmother        Pacemaker  . Leukemia Paternal Grandmother   . Diabetes Paternal Grandmother   . Bipolar disorder Maternal Aunt   . Bipolar disorder Maternal Uncle  Social History: I have reviewed social history from my progress note on 04/11/2020 Social History   Socioeconomic History  . Marital status: Single    Spouse name: Not on file  . Number of children: 3  . Years of education: Not on file  . Highest education level: Not on file  Occupational History  . Occupation: Energy manager   Tobacco Use  . Smoking status: Former Smoker    Packs/day: 0.25    Years: 4.00    Pack years: 1.00    Types: Cigarettes     Start date: 05/04/2011    Quit date: 07/21/2020    Years since quitting: 0.0  . Smokeless tobacco: Never Used  Vaping Use  . Vaping Use: Never used  Substance and Sexual Activity  . Alcohol use: Yes    Alcohol/week: 0.0 standard drinks    Comment: occasionally  . Drug use: No  . Sexual activity: Yes    Partners: Male  Other Topics Concern  . Not on file  Social History Narrative   Lives with same person / boyfriend for the past 18 years   She has three son. 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    Youngest sone died at age 70 from Leukemia    Social Determinants of Health   Financial Resource Strain:   . Difficulty of Paying Living Expenses: Not on file  Food Insecurity:   . Worried About Programme researcher, broadcasting/film/video in the Last Year: Not on file  . Ran Out of Food in the Last Year: Not on file  Transportation Needs:   . Lack of Transportation (Medical): Not on file  . Lack of Transportation (Non-Medical): Not on file  Physical Activity:   . Days of Exercise per Week: Not on file  . Minutes of Exercise per Session: Not on file  Stress:   . Feeling of Stress : Not on file  Social Connections:   . Frequency of Communication with Friends and Family: Not on file  . Frequency of Social Gatherings with Friends and Family: Not on file  . Attends Religious Services: Not on file  . Active Member of Clubs or Organizations: Not on file  . Attends Banker Meetings: Not on file  . Marital Status: Not on file    Allergies:  Allergies  Allergen Reactions  . Corylus   . Hydrocodone     Metabolic Disorder Labs: Lab Results  Component Value Date   HGBA1C 5.2 04/14/2020   MPG 105 07/24/2017   Lab Results  Component Value Date   PROLACTIN 21.7 04/14/2020   Lab Results  Component Value Date   CHOL 208 (H) 04/14/2020   TRIG 90 04/14/2020   HDL 61 04/14/2020   CHOLHDL 3.4 04/14/2020   LDLCALC 131 (H)  04/14/2020   LDLCALC 125 (H) 07/24/2017   Lab Results  Component Value Date   TSH 1.090 04/14/2020   TSH 1.05 07/24/2017    Therapeutic Level Labs: No results found for: LITHIUM No results found for: VALPROATE No components found for:  CBMZ  Current Medications: Current Outpatient Medications  Medication Sig Dispense Refill  . ARIPiprazole (ABILIFY) 10 MG tablet Take 1 tablet (10 mg total) by mouth daily. 30 tablet 1  . clonazePAM (KLONOPIN) 1 MG tablet Take 0.5-1 tablets (0.5-1 mg total) by mouth as directed. Start taking half tablet daily during the day as needed for anxiety attacks  and one tablet at bedtime as needed for sleep and anxiety- please limit use 45 tablet 1  . mirtazapine (REMERON) 15 MG tablet Take 0.5-1 tablets (7.5-15 mg total) by mouth at bedtime. Sleep and mood 30 tablet 1  . Rimegepant Sulfate (NURTEC) 75 MG TBDP Take 1 tablet by mouth every other day. 16 tablet 2  . topiramate (TOPAMAX) 100 MG tablet Take 1 tablet (100 mg total) by mouth 2 (two) times daily. Max of 100  Mg twice daily 180 tablet 0  . venlafaxine XR (EFFEXOR-XR) 37.5 MG 24 hr capsule Take 1 capsule (37.5 mg total) by mouth daily with breakfast. 30 capsule 1  . Vitamin D, Ergocalciferol, (DRISDOL) 1.25 MG (50000 UNIT) CAPS capsule TAKE 1 CAPSULE (50,000 UNITS TOTAL) BY MOUTH EVERY 7 (SEVEN) DAYS. (NOT COVERED) 12 capsule 0   No current facility-administered medications for this visit.     Musculoskeletal: Strength & Muscle Tone: UTA Gait & Station: Observed as seated Patient leans: N/A  Psychiatric Specialty Exam: Review of Systems  Constitutional: Positive for appetite change.  Psychiatric/Behavioral: Positive for dysphoric mood. The patient is nervous/anxious.   All other systems reviewed and are negative.   There were no vitals taken for this visit.There is no height or weight on file to calculate BMI.  General Appearance: Casual  Eye Contact:  Fair  Speech:  Clear and Coherent   Volume:  Normal  Mood:  Anxious and Depressed  Affect:  Congruent  Thought Process:  Goal Directed and Descriptions of Associations: Intact  Orientation:  Full (Time, Place, and Person)  Thought Content: Logical   Suicidal Thoughts:  No  Homicidal Thoughts:  No  Memory:  Immediate;   Fair Recent;   Fair Remote;   Fair  Judgement:  Fair  Insight:  Fair  Psychomotor Activity:  Normal  Concentration:  Concentration: Fair and Attention Span: Fair  Recall:  [employer] of Knowledge: Fair  Language: Fair  Akathisia:  No  Handed:  Right  AIMS (if indicated): UTA  Assets:  Communication Skills Desire for Improvement Housing Social Support  ADL's:  Intact  Cognition: WNL  Sleep:  improving   Screenings: GAD-7     Office Visit from 08/03/2020 in [facility] Counselor from 05/18/2020 in [facility] Office Visit from 03/15/2020 in [facility] Office Visit from 02/02/2020 in [facility] Office Visit from 07/31/2017 in [facility]  Total GAD-7 Score 21 21 21 21 3     PHQ2-9     Video Visit from 08/07/2020 in [facility] Office Visit from 08/03/2020 in [facility] Office Visit from 06/13/2020 in [facility] Counselor from 05/18/2020 in [facility] Office Visit from 03/15/2020 in [facility]  PHQ-2 Total Score 6 4 6 5 6   PHQ-9 Total Score 17 17 22 20 26        Assessment and Plan: NIANA EKE is a 39 year old African-American female, lives in [place], has a history of depression, anxiety, bereavement, was evaluated by telemedicine today.  Patient is biologically predisposed due to her trauma, history of mental health problems.  Patient with psychosocial stressors of the death of her 83 year old son, continues to grieve his loss and continues to struggle with mood  symptoms.  Plan as noted below.  Plan MDD-unstable PHQ 9 today equals 17 Start venlafaxine extended release 37.5 mg p.o. daily Abilify 10 mg p.o. daily Continue Remeron 15  mg p.o. nightly Continue CBT with Ms.Hussami  GAD-improving Klonopin 0.5 mg as needed during the day and 1 mg at bedtime.  Patient however has been limiting use Start venlafaxine extended release 37.5 mg p.o. daily Remeron will also help  Bereavement-some progress Continue grief counseling  Follow-up in clinic in 3 to 4 weeks or sooner if needed.  I have spent atleast 20 minutes face to face by video with patient today. More than 50 % of the time was spent for preparing to see the patient ( e.g., review of test, records ),  ordering medications and test ,psychoeducation and supportive psychotherapy and care coordination,as well as documenting clinical information in electronic health record. This note was generated in part or whole with voice recognition software. Voice recognition is usually quite accurate but there are transcription errors that can and very often do occur. I apologize for any typographical errors that were not detected and corrected.        Jomarie Longs, MD 08/07/2020, 12:54 PM

## 2020-08-09 ENCOUNTER — Ambulatory Visit: Payer: 59 | Admitting: Licensed Clinical Social Worker

## 2020-08-18 ENCOUNTER — Telehealth: Payer: Self-pay | Admitting: Psychiatry

## 2020-08-18 NOTE — Telephone Encounter (Signed)
I have completed intermittent FMLA.  We will have Jessica CMA fax it. 

## 2020-08-18 NOTE — Telephone Encounter (Signed)
faxed and confirmed paperwork sent to [employer] at [phone]

## 2020-08-18 NOTE — Telephone Encounter (Signed)
I have completed intermittent FMLA.  We will have Shanda Bumps CMA fax it.

## 2020-08-21 ENCOUNTER — Other Ambulatory Visit: Payer: Self-pay | Admitting: Psychiatry

## 2020-08-21 ENCOUNTER — Other Ambulatory Visit: Payer: Self-pay

## 2020-08-21 ENCOUNTER — Ambulatory Visit (INDEPENDENT_AMBULATORY_CARE_PROVIDER_SITE_OTHER): Payer: Self-pay | Admitting: Licensed Clinical Social Worker

## 2020-08-21 ENCOUNTER — Ambulatory Visit (INDEPENDENT_AMBULATORY_CARE_PROVIDER_SITE_OTHER): Payer: Self-pay | Admitting: Family Medicine

## 2020-08-21 ENCOUNTER — Telehealth: Payer: Self-pay

## 2020-08-21 ENCOUNTER — Encounter: Payer: Self-pay | Admitting: Family Medicine

## 2020-08-21 VITALS — BP 120/80 | HR 91 | Temp 98.1°F | Resp 14 | Ht 64.0 in | Wt 159.0 lb

## 2020-08-21 DIAGNOSIS — Z5329 Procedure and treatment not carried out because of patient's decision for other reasons: Secondary | ICD-10-CM

## 2020-08-21 DIAGNOSIS — F411 Generalized anxiety disorder: Secondary | ICD-10-CM

## 2020-08-21 DIAGNOSIS — Z634 Disappearance and death of family member: Secondary | ICD-10-CM

## 2020-08-21 DIAGNOSIS — F333 Major depressive disorder, recurrent, severe with psychotic symptoms: Secondary | ICD-10-CM

## 2020-08-21 NOTE — Telephone Encounter (Signed)
Appointment - Patient left a message she had missed an appointment with [facility], therapist and wanted a call back to reschedule.

## 2020-08-21 NOTE — Progress Notes (Signed)
LCSW counselor tried to connect with patient for scheduled appointment via MyChart video text request x 2 and email request; also tried to connect via phone without success. LCSW counselor left message for patient to call office number to reschedule OPT appointment.  

## 2020-08-21 NOTE — Progress Notes (Signed)
Name: Virginia Walls   MRN: [id number]    DOB: [birth year]   Date:08/21/2020       Progress Note  Subjective  Chief Complaint  Paperwork completion  HPI   Grief/Major depression recurrent: she states first episode of depression was after the birth of her second son she was given medications but was not compliant with it, she states she never felt back to normal, she states keeps things to herself and cannot handle stress well, she states she was depressed before her son got the diagnosis of leukemia back in 20-Oct-2019 and died on February 04, 2020 .Marland Kitchen  She states episodes in the past  Were never as bad  She has been on FMLA since he was [facility] to [facility] October 20, 2019. She was seen in April 2021  and had been  crying daily for the previous months. She was having  poor appetite, could not  sleep at night, had suicidal thoughts but no planning. We started her on Lexapro but symptoms did not improve and she started to have visual hallucinations.  She was referred to  Dr. Elna Breslow first appointment was in June 2021and is also seeing a therapist once a week now . She is off Lexapro and taking Abilify 10 mg and Remeron at night  She is able to care for her hygiene, she has poor appetite but makes herself eat. She was released back to work September 2021, she states she is doing the same job but getting re-trained because she was gone for a long time. 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 She states she still has days that she spends the day crying , her boss has been accommodating and switching her to training instead of direct customer contact when she has uncontrollable crying spells, but she needs the [employer] disability forms filled out for intermittent leave to allow her to take time off for office visits but also for the days that she cannot stop crying.      Patient Active Problem List   Diagnosis Date Noted  . MDD (major depressive disorder),  recurrent, severe, with psychosis (HCC) 04/11/2020  . GAD (generalized anxiety disorder) 04/11/2020  . Bereavement 04/11/2020  . At risk for prolonged QT interval syndrome 04/11/2020  . High risk medication use 04/11/2020  . Anxiety 11/13/2017  . Chronic constipation 08/12/2017  . Hyperglycemia 05/04/2015  . Tattoos 05/04/2015  . Headache, migraine 04/29/2015  . Excess weight 04/29/2015  . Restless leg 04/29/2015  . Snores 04/29/2015  . Vitamin D deficiency 04/29/2015    Past Surgical History:  Procedure Laterality Date  . ABDOMINAL HYSTERECTOMY    . TUBAL LIGATION      Family History  Problem Relation Age of Onset  . Heart disease Mother   . Asthma Mother   . Hypothyroidism Mother   . Bipolar disorder Mother   . Hypothyroidism Sister   . Diabetes Sister   . Hyperlipidemia Sister   . Asthma Son   . Heart disease Paternal Grandmother        Pacemaker  . Leukemia Paternal Grandmother   . Diabetes Paternal Grandmother   . Bipolar disorder Maternal Aunt   . Bipolar disorder Maternal Uncle     Social History   Tobacco Use  . Smoking status: Former Smoker    Packs/day: 0.25    Years: 4.00    Pack years: 1.00    Types: Cigarettes  Start date: 05/04/2011    Quit date: 07/21/2020    Years since quitting: 0.0  . Smokeless tobacco: Never Used  Substance Use Topics  . Alcohol use: Yes    Alcohol/week: 0.0 standard drinks    Comment: occasionally     Current Outpatient Medications:  .  ARIPiprazole (ABILIFY) 10 MG tablet, Take 1 tablet (10 mg total) by mouth daily., Disp: 30 tablet, Rfl: 1 .  clonazePAM (KLONOPIN) 1 MG tablet, Take 0.5-1 tablets (0.5-1 mg total) by mouth as directed. Start taking half tablet daily during the day as needed for anxiety attacks and one tablet at bedtime as needed for sleep and anxiety- please limit use, Disp: 45 tablet, Rfl: 1 .  mirtazapine (REMERON) 15 MG tablet, Take 0.5-1 tablets (7.5-15 mg total) by mouth at bedtime. Sleep and mood,  Disp: 30 tablet, Rfl: 1 .  Rimegepant Sulfate (NURTEC) 75 MG TBDP, Take 1 tablet by mouth every other day., Disp: 16 tablet, Rfl: 2 .  topiramate (TOPAMAX) 100 MG tablet, Take 1 tablet (100 mg total) by mouth 2 (two) times daily. Max of 100  Mg twice daily, Disp: 180 tablet, Rfl: 0 .  venlafaxine XR (EFFEXOR-XR) 37.5 MG 24 hr capsule, Take 1 capsule (37.5 mg total) by mouth daily with breakfast., Disp: 30 capsule, Rfl: 1 .  Vitamin D, Ergocalciferol, (DRISDOL) 1.25 MG (50000 UNIT) CAPS capsule, TAKE 1 CAPSULE (50,000 UNITS TOTAL) BY MOUTH EVERY 7 (SEVEN) DAYS. (NOT COVERED), Disp: 12 capsule, Rfl: 0  Allergies  Allergen Reactions  . Corylus   . Hydrocodone     I personally reviewed active problem list, medication list, allergies, family history, social history, health maintenance with the patient/caregiver today.   ROS  Constitutional: Negative for fever or weight change.  Respiratory: Negative for cough and shortness of breath.   Cardiovascular: Negative for chest pain or palpitations.  Gastrointestinal: Negative for abdominal pain, no bowel changes.  Musculoskeletal: Negative for gait problem or joint swelling.  Skin: Negative for rash.  Neurological: Negative for dizziness or headache.  No other specific complaints in a complete review of systems (except as listed in HPI above).  Objective  Vitals:   08/21/20 1510  BP: 120/80  Pulse: 91  Resp: 14  Temp: 98.1 F (36.7 C)  TempSrc: Oral  SpO2: 100%  Weight: 159 lb (72.1 kg)  Height: 5\' 4"  (1.626 m)    Body mass index is 27.29 kg/m.  Physical Exam  Constitutional: Patient appears well-developed and well-nourished. Overweight.  No distress.  HEENT: head atraumatic, normocephalic, pupils equal and reactive to light,  neck supple Cardiovascular: Normal rate, regular rhythm and normal heart sounds.  No murmur heard. No BLE edema. Pulmonary/Chest: Effort normal and breath sounds normal. No respiratory distress. Abdominal:  Soft.  There is no tenderness. Psychiatric: Patient has a normal mood and affect. behavior is normal. Judgment and thought content normal.  Recent Results (from the past 2160 hour(s))  Hepatitis C Antibody     Status: None   Collection Time: 06/13/20  2:10 PM  Result Value Ref Range   Hepatitis C Ab NON-REACTIVE NON-REACTI   SIGNAL TO CUT-OFF 0.01 <1.00    Comment: . HCV antibody was non-reactive. There is no laboratory  evidence of HCV infection. . In most cases, no further action is required. However, if recent HCV exposure is suspected, a test for HCV RNA (test code 33295) is suggested. . For additional information please refer to http://education.quest[facility].com/faq/FAQ22v1 (This link is being provided for informational/ educational  purposes only.) .     PHQ2/9: Depression screen [facility] 2/9 08/21/2020 08/03/2020 06/13/2020 03/15/2020 02/02/2020  Decreased Interest 2 2 3 3 2   Down, Depressed, Hopeless 3 2 3 3 3   PHQ - 2 Score 5 4 6 6 5   Altered sleeping 2 2 3 3 3   Tired, decreased energy 2 2 3 3 3   Change in appetite 2 2 3 3 3   Feeling bad or failure about yourself  2 2 2 3 3   Trouble concentrating 2 2 2 3 3   Moving slowly or fidgety/restless 2 2 3 3 3   Suicidal thoughts 0 1 0 2 1  PHQ-9 Score 17 17 22 26 24   Difficult doing work/chores Somewhat difficult Very difficult Somewhat difficult Extremely dIfficult Extremely dIfficult  Some encounter information is confidential and restricted. Go to Review Flowsheets activity to see all data.  Some recent data might be hidden    phq 9 is positive   Fall Risk: Fall Risk  08/21/2020 08/03/2020 06/13/2020 03/15/2020 02/02/2020  Falls in the past year? 1 1 1  0 0  Number falls in past yr: 1 1 1  0 0  Injury with Fall? 1 1 1  0 0  Risk for fall due to : History of fall(s) History of fall(s) - - -     Functional Status Survey: Is the patient deaf or have difficulty hearing?: No Does the patient have difficulty seeing, even when  wearing glasses/contacts?: No Does the patient have difficulty concentrating, remembering, or making decisions?: Yes Does the patient have difficulty walking or climbing stairs?: No Does the patient have difficulty dressing or bathing?: No Does the patient have difficulty doing errands alone such as visiting a doctor's office or shopping?: No    Assessment & Plan  1. MDD (major depressive disorder), recurrent, severe, with psychosis (HCC)  Forms filled out with patient, intermittent leave as requested, she has been compliant with psychiatrist and therapist, they are trying to get a less stressful position at work

## 2020-08-21 NOTE — Telephone Encounter (Signed)
pt appt was r/s

## 2020-09-04 ENCOUNTER — Other Ambulatory Visit: Payer: Self-pay

## 2020-09-04 ENCOUNTER — Telehealth (INDEPENDENT_AMBULATORY_CARE_PROVIDER_SITE_OTHER): Payer: 59 | Admitting: Psychiatry

## 2020-09-04 ENCOUNTER — Encounter: Payer: Self-pay | Admitting: Psychiatry

## 2020-09-04 DIAGNOSIS — F411 Generalized anxiety disorder: Secondary | ICD-10-CM | POA: Diagnosis not present

## 2020-09-04 DIAGNOSIS — Z634 Disappearance and death of family member: Secondary | ICD-10-CM | POA: Diagnosis not present

## 2020-09-04 DIAGNOSIS — F3341 Major depressive disorder, recurrent, in partial remission: Secondary | ICD-10-CM

## 2020-09-04 MED ORDER — VENLAFAXINE HCL ER 75 MG PO CP24: 75.0000 mg | ORAL_CAPSULE | Freq: Every day | ORAL | 0 refills | Status: DC

## 2020-09-04 NOTE — Progress Notes (Signed)
Virtual Visit via Video Note  I connected with Virginia Walls on 09/04/20 at  1:00 PM EST by a video enabled telemedicine application and verified that I am speaking with the correct person using two identifiers.  Location Provider Location : [facility] Patient Location : Home  Participants: Patient , Provider    I discussed the limitations of evaluation and management by telemedicine and the availability of in person appointments. The patient expressed understanding and agreed to proceed.  I discussed the assessment and treatment plan with the patient. The patient was provided an opportunity to ask questions and all were answered. The patient agreed with the plan and demonstrated an understanding of the instructions.  The patient was advised to call back or seek an in-person evaluation if the symptoms worsen or if the condition fails to improve as anticipated.  BH MD OP Progress Note  09/04/2020 5:00 PM Virginia Walls  MRN:  [id number]  Chief Complaint:  Chief Complaint    Follow-up     HPI: Virginia BARTOL is a 39 year old African-American female, employed, lives in [place], married, has a history of MDD, bereavement, GAD, migraine headaches, restless leg syndrome, Achilles tendinitis was evaluated by telemedicine today.  Patient today reports she continues to grieve the loss of her child who passed away.  She reports she had 2 days 2 weeks ago when she spent the whole day crying and had to take those 2 days off from work.  She reports she could not focus at work those 2 days.  She however reports currently she feels much better than before.  She continues to struggle with sadness, crying spells .  She is interested in dosage increase of her medications.  She is tolerating the venlafaxine 37.5 mg well.  Denies side effects  She does struggle with sleep problems since her husband has to wake up to go to work at 2 AM.  This does make her sleep restless.  The mirtazapine was helpful  otherwise.  Patient denies any suicidality, homicidality or perceptual disturbances.  She is interested in continuing psychotherapy sessions with Trula Ore her therapist.  Patient denies any other concerns today.    Visit Diagnosis:    ICD-10-CM   1. MDD (major depressive disorder), recurrent, in partial remission (HCC)  F33.41 venlafaxine XR (EFFEXOR XR) 75 MG 24 hr capsule  2. Bereavement  Z63.4   3. GAD (generalized anxiety disorder)  F41.1 venlafaxine XR (EFFEXOR XR) 75 MG 24 hr capsule    Past Psychiatric History: I have reviewed past psychiatric history from my progress note on 04/11/2020.  Past trials of Lexapro, Abilify.  Past Medical History:  Past Medical History:  Diagnosis Date  . Back pain   . Depression   . Migraine   . Palpitation   . Panic disorder   . RLS (restless legs syndrome)     Past Surgical History:  Procedure Laterality Date  . ABDOMINAL HYSTERECTOMY    . TUBAL LIGATION      Family Psychiatric History: I have reviewed family psychiatric history from my progress note from 04/11/2020  Family History:  Family History  Problem Relation Age of Onset  . Heart disease Mother   . Asthma Mother   . Hypothyroidism Mother   . Bipolar disorder Mother   . Hypothyroidism Sister   . Diabetes Sister   . Hyperlipidemia Sister   . Asthma Son   . Heart disease Paternal Grandmother        Pacemaker  . Leukemia  Paternal Grandmother   . Diabetes Paternal Grandmother   . Bipolar disorder Maternal Aunt   . Bipolar disorder Maternal Uncle     Social History: Reviewed social history from my progress note on 04/11/2020 Social History   Socioeconomic History  . Marital status: Single    Spouse name: Not on file  . Number of children: 3  . Years of education: Not on file  . Highest education level: Not on file  Occupational History  . Occupation: Energy manager   Tobacco Use  . Smoking status: Former Smoker    Packs/day: 0.25    Years: 4.00     Pack years: 1.00    Types: Cigarettes    Start date: 05/04/2011    Quit date: 07/21/2020    Years since quitting: 0.1  . Smokeless tobacco: Never Used  Vaping Use  . Vaping Use: Never used  Substance and Sexual Activity  . Alcohol use: Yes    Alcohol/week: 0.0 standard drinks    Comment: occasionally  . Drug use: No  . Sexual activity: Yes    Partners: Male  Other Topics Concern  . Not on file  Social History Narrative   Lives with same person / boyfriend for the past 18 years   She has three son. 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    Youngest sone died at age 45 from Leukemia    Social Determinants of Health   Financial Resource Strain:   . Difficulty of Paying Living Expenses: Not on file  Food Insecurity:   . Worried About Programme researcher, broadcasting/film/video in the Last Year: Not on file  . Ran Out of Food in the Last Year: Not on file  Transportation Needs:   . Lack of Transportation (Medical): Not on file  . Lack of Transportation (Non-Medical): Not on file  Physical Activity:   . Days of Exercise per Week: Not on file  . Minutes of Exercise per Session: Not on file  Stress:   . Feeling of Stress : Not on file  Social Connections:   . Frequency of Communication with Friends and Family: Not on file  . Frequency of Social Gatherings with Friends and Family: Not on file  . Attends Religious Services: Not on file  . Active Member of Clubs or Organizations: Not on file  . Attends Banker Meetings: Not on file  . Marital Status: Not on file    Allergies:  Allergies  Allergen Reactions  . Corylus   . Hydrocodone     Metabolic Disorder Labs: Lab Results  Component Value Date   HGBA1C 5.2 04/14/2020   MPG 105 07/24/2017   Lab Results  Component Value Date   PROLACTIN 21.7 04/14/2020   Lab Results  Component Value Date   CHOL 208 (H) 04/14/2020   TRIG 90 04/14/2020   HDL 61 04/14/2020    CHOLHDL 3.4 04/14/2020   LDLCALC 131 (H) 04/14/2020   LDLCALC 125 (H) 07/24/2017   Lab Results  Component Value Date   TSH 1.090 04/14/2020   TSH 1.05 07/24/2017    Therapeutic Level Labs: No results found for: LITHIUM No results found for: VALPROATE No components found for:  CBMZ  Current Medications: Current Outpatient Medications  Medication Sig Dispense Refill  . ARIPiprazole (ABILIFY) 10 MG tablet TAKE 1 TABLET BY MOUTH EVERY DAY 90 tablet 1  . clonazePAM (KLONOPIN) 1 MG tablet Take  0.5-1 tablets (0.5-1 mg total) by mouth as directed. Start taking half tablet daily during the day as needed for anxiety attacks and one tablet at bedtime as needed for sleep and anxiety- please limit use 45 tablet 1  . mirtazapine (REMERON) 15 MG tablet TAKE 0.5-1 TABLETS (7.5-15 MG TOTAL) BY MOUTH AT BEDTIME. SLEEP AND MOOD 90 tablet 1  . Rimegepant Sulfate (NURTEC) 75 MG TBDP Take 1 tablet by mouth every other day. 16 tablet 2  . topiramate (TOPAMAX) 100 MG tablet Take 1 tablet (100 mg total) by mouth 2 (two) times daily. Max of 100  Mg twice daily 180 tablet 0  . venlafaxine XR (EFFEXOR XR) 75 MG 24 hr capsule Take 1 capsule (75 mg total) by mouth daily with breakfast. 90 capsule 0  . Vitamin D, Ergocalciferol, (DRISDOL) 1.25 MG (50000 UNIT) CAPS capsule TAKE 1 CAPSULE (50,000 UNITS TOTAL) BY MOUTH EVERY 7 (SEVEN) DAYS. (NOT COVERED) 12 capsule 0   No current facility-administered medications for this visit.     Musculoskeletal: Strength & Muscle Tone: UTA Gait & Station: UTA Patient leans: N/A  Psychiatric Specialty Exam: Review of Systems  Psychiatric/Behavioral: Positive for dysphoric mood and sleep disturbance.  All other systems reviewed and are negative.   There were no vitals taken for this visit.There is no height or weight on file to calculate BMI.  General Appearance: Casual  Eye Contact:  Fair  Speech:  Normal Rate  Volume:  Normal  Mood:  Depressed  Affect:  Tearful   Thought Process:  Goal Directed and Descriptions of Associations: Intact  Orientation:  Full (Time, Place, and Person)  Thought Content: Logical   Suicidal Thoughts:  No  Homicidal Thoughts:  No  Memory:  Immediate;   Fair Recent;   Fair Remote;   Fair  Judgement:  Fair  Insight:  Fair  Psychomotor Activity:  Normal  Concentration:  Concentration: Fair and Attention Span: Fair  Recall:  [employer] of Knowledge: Fair  Language: Fair  Akathisia:  No  Handed:  Right  AIMS (if indicated): UTA  Assets:  Communication Skills Desire for Improvement Housing Social Support  ADL's:  Intact  Cognition: WNL  Sleep:  Restless   Screenings: GAD-7     Office Visit from 08/21/2020 in [facility] Office Visit from 08/03/2020 in [facility] Counselor from 05/18/2020 in [facility] Office Visit from 03/15/2020 in [facility] Office Visit from 02/02/2020 in [facility]  Total GAD-7 Score PHQ2-9     Office Visit from 08/21/2020 in [facility] Video Visit from 08/07/2020 in [facility] Office Visit from 08/03/2020 in [facility] Office Visit from 06/13/2020 in [facility] Counselor from 05/18/2020 in [facility]  PHQ-2 Total Score PHQ-9 Total Score Assessment and Plan: Virginia Walls is a 39 year old African-American female, lives in [place], has a history of depression, anxiety, bereavement was evaluated by telemedicine today.  Patient is biologically predisposed due to her trauma, death in her family, history of mental health problems.  Patient with psychosocial stressors of death of her 50 year old son.  She continues to grieve and struggles with depressive symptoms.  Plan as noted below.  Plan MDD-some progress Increase  to venlafaxine extended release 75 mg p.o. daily.  Abilify 10 mg p.o. daily Remeron 15 mg p.o. nightly Continue CBT with Ms.Hussami  GAD-improving Klonopin 0.5 mg as needed during the day, 1 mg at bedtime.  She has been limiting use Venlafaxine as prescribed Remeron will also help  Bereavement-some progress Continue grief counseling  Provided supportive psychotherapy, grief counseling.  Provided resources.  Follow-up in clinic in 4 weeks or sooner if needed.  I have spent atleast 20 minutes face to face by video with patient today. More than 50 % of the time was spent for preparing to see the patient ( e.g., review of test, records ),  ordering medications and test ,psychoeducation and supportive psychotherapy and care coordination,as well as documenting clinical information in electronic health record. This note was generated in part or whole with voice recognition software. Voice recognition is usually quite accurate but there are transcription errors that can and very often do occur. I apologize for any typographical errors that were not detected and corrected.       Jomarie Longs, MD 09/04/2020, 5:00 PM

## 2020-09-07 ENCOUNTER — Other Ambulatory Visit: Payer: Self-pay

## 2020-09-07 ENCOUNTER — Ambulatory Visit (INDEPENDENT_AMBULATORY_CARE_PROVIDER_SITE_OTHER): Payer: 59 | Admitting: Licensed Clinical Social Worker

## 2020-09-07 DIAGNOSIS — F3341 Major depressive disorder, recurrent, in partial remission: Secondary | ICD-10-CM | POA: Diagnosis not present

## 2020-09-07 DIAGNOSIS — Z634 Disappearance and death of family member: Secondary | ICD-10-CM | POA: Diagnosis not present

## 2020-09-07 NOTE — Progress Notes (Addendum)
Virtual Visit via Video Note  I connected with Virginia Walls on 09/07/20 at  1:30 PM EST by a video enabled telemedicine application and verified that I am speaking with the correct person using two identifiers.  Location: Patient: home Provider: [facility]  Session Participants: Patient--Virginia Walls Counselor--Angelo Caroll, MSW, LCSW   I discussed the limitations of evaluation and management by telemedicine and the availability of in person appointments. The patient expressed understanding and agreed to proceed.  The patient was advised to call back or seek an in-person evaluation if the symptoms worsen or if the condition fails to improve as anticipated.  I provided 45 minutes of non-face-to-face time during this encounter.   [name], LCSW    THERAPIST PROGRESS NOTE  Session Time: 1:30-2:15p  Participation Level: Active  Behavioral Response: Neat and Well GroomedAlertAnxious and Depressed  Type of Therapy: Individual Therapy  Treatment Goals addressed: Anxiety and Coping  Interventions: CBT, Supportive and Other: grief counseling  Summary: [name] is a 39 y.o. female who presents with continuing symptoms consistent with depression and bereavement diagnosis.   Allowed pt to explore and express thoughts and feelings about current grief and relationships. Pt continuing to have breakthrough grief "breakdowns".   Discussed grief stages in detail and allowed pt to identify stages past/current.  Encouraged continued self care, life balance.   Suicidal/Homicidal: No  Therapist Response: [name] reports that she is able to feel more positive emotions, which is a sign of progress. Treatment to continue as indicated  Plan: Return again in 3 weeks.The ongoing treatment plan includes maintaining current levels of progress and continuing to build skills to manage mood, grief counseling, improve stress/anxiety management, emotion regulation, distress tolerance,  and behavior modification.   Diagnosis: Axis I: Major depressive disorder, recurrent, in partial remission; bereavement    Axis II: No diagnosis    Ernest Haber Hermilo Dutter, LCSW 09/07/2020

## 2020-09-18 ENCOUNTER — Telehealth: Payer: Self-pay | Admitting: Family Medicine

## 2020-09-18 NOTE — Telephone Encounter (Signed)
Called and lvm for patient to call our office in regards to her recent FMLA paperwork request.  Dr. Carlynn Purl needs know if this for intermittent FMLA, how often is patient out of work or missed hours per episode?  Will also send mychart message to patient.

## 2020-09-19 ENCOUNTER — Other Ambulatory Visit: Payer: Self-pay

## 2020-09-19 ENCOUNTER — Ambulatory Visit (INDEPENDENT_AMBULATORY_CARE_PROVIDER_SITE_OTHER): Payer: 59 | Admitting: Licensed Clinical Social Worker

## 2020-09-19 DIAGNOSIS — F3341 Major depressive disorder, recurrent, in partial remission: Secondary | ICD-10-CM | POA: Diagnosis not present

## 2020-09-19 DIAGNOSIS — Z634 Disappearance and death of family member: Secondary | ICD-10-CM

## 2020-09-19 NOTE — Progress Notes (Signed)
Virtual Visit via Video Note  I connected with Virginia Walls on 09/19/20 at  8:00 AM EST by a video enabled telemedicine application and verified that I am speaking with the correct person using two identifiers.  Location: Patient: home Provider: [facility]   I discussed the limitations of evaluation and management by telemedicine and the availability of in person appointments. The patient expressed understanding and agreed to proceed.  The patient was advised to call back or seek an in-person evaluation if the symptoms worsen or if the condition fails to improve as anticipated.  I provided 45 minutes of non-face-to-face time during this encounter.   Muaaz Brau R Sindia Kowalczyk, LCSW   THERAPIST PROGRESS NOTE  Session Time: 8-8:45a  Participation Level: Active  Behavioral Response: Neat and Well GroomedAlertAnxious and Depressed  Type of Therapy: Individual Therapy  Treatment Goals addressed: Coping  Interventions: CBT, Supportive and Other: grief counseling  Summary: Virginia Walls is a 39 y.o. female who presents with continuing symptoms related to depression, grief, and anxiety diagnoses. Pt reports that she has low motivation, insomnia, low initiative, hard to focus/concentrate, and inconsolable crying at times. Pt reports that anniversary of son going into hospital is coming up (12/2) and pt is dreading the date. Pt tried hard to cope through thanksgiving but feels that family members are not very understanding and want her to be her "normal" self. Pt admits that she drank a lot of alcohol throughout thanksgiving to get through. Pt states that she has been doing more of this recently. Encouraged pt to be aware of alcohol intake and to cut back as needed. Pt doesn't feel that its a problem at this point in time but also does not want it to impact health in a negative way and doesn't want alcohol to become a coping mechanism.  Encouraged self care, taking time off work, positive social  engagement, and involvement in Hospice-based groups. Emailed pt information about Hospice groups.  Suicidal/Homicidal: No  Therapist Response: [name], grief, and anxiety symptoms due to this particular time of year, which is a trigger. This is indicative of fluctuating/intermittent progress. Recommended time off work and engaging with Hospice groups. Treatment to continue as indicated (q 2 wks)  Plan: Return again in 2 weeks. The ongoing treatment plan includes maintaining current levels of progress and continuing to build skills to manage mood, improve stress/anxiety management, grief counseling, emotion regulation, distress tolerance, and behavior modification.   Diagnosis: Axis I: Bereavement and Major Depression, Recurrent severe    Axis II: No diagnosis    Ernest Haber Erskin Zinda, LCSW 09/19/2020

## 2020-09-20 ENCOUNTER — Telehealth: Payer: Self-pay | Admitting: Licensed Clinical Social Worker

## 2020-09-20 NOTE — Telephone Encounter (Signed)
Work note faxed to [phone] per pt request. 11/30-12/13.   Weyman Pedro, MSW, LCSW Outpatient Therapist/Triage Specialist

## 2020-09-29 ENCOUNTER — Telehealth: Payer: Self-pay | Admitting: Psychiatry

## 2020-09-29 NOTE — Telephone Encounter (Signed)
Completed FMLA request-dated 11/30-12/13/2021. I have reviewed with therapist Ms. Christina Hussami's notes regarding taking patient off from work.  Discussed the same with patient.  We will have Shanda Bumps CMA fax FMLA to her company.

## 2020-10-03 ENCOUNTER — Ambulatory Visit: Payer: 59 | Admitting: Licensed Clinical Social Worker

## 2020-10-04 ENCOUNTER — Encounter: Payer: Self-pay | Admitting: Psychiatry

## 2020-10-04 ENCOUNTER — Other Ambulatory Visit: Payer: Self-pay

## 2020-10-04 ENCOUNTER — Telehealth (INDEPENDENT_AMBULATORY_CARE_PROVIDER_SITE_OTHER): Payer: 59 | Admitting: Psychiatry

## 2020-10-04 DIAGNOSIS — Z634 Disappearance and death of family member: Secondary | ICD-10-CM

## 2020-10-04 DIAGNOSIS — F3341 Major depressive disorder, recurrent, in partial remission: Secondary | ICD-10-CM | POA: Diagnosis not present

## 2020-10-04 DIAGNOSIS — F411 Generalized anxiety disorder: Secondary | ICD-10-CM | POA: Diagnosis not present

## 2020-10-04 NOTE — Progress Notes (Signed)
Virtual Visit via Video Note  I connected with Virginia Walls on 10/04/20 at  9:30 AM EST by a video enabled telemedicine application and verified that I am speaking with the correct person using two identifiers.  Location Provider Location : [facility] Patient Location : Car  Participants: Patient , Provider   I discussed the limitations of evaluation and management by telemedicine and the availability of in person appointments. The patient expressed understanding and agreed to proceed.     I discussed the assessment and treatment plan with the patient. The patient was provided an opportunity to ask questions and all were answered. The patient agreed with the plan and demonstrated an understanding of the instructions.   The patient was advised to call back or seek an in-person evaluation if the symptoms worsen or if the condition fails to improve as anticipated.  BH MD OP Progress Note  10/04/2020 2:41 PM Virginia Walls  MRN:  [id number]  Chief Complaint:  Chief Complaint    Follow-up     HPI: Virginia Walls is a 39 year old African-American female, employed, lives in [place], married, has a history of MDD, bereavement, GAD, migraine headaches, restless leg syndrome, Achilles tendinitis was evaluated by telemedicine today.  Patient today reports she continues to grieve the loss of her child who passed away.  His favorite holiday was Christmas.  As it gets closer to Christmas she reports she is preparing herself to face that day.  She reports her other children are also not looking forward to Christmas this time.  She is trying her best to keep her children happy.  She reports she has been shopping a lot to distract herself as well as to keep her other children happy.  She reports she is compliant on the venlafaxine.  Denies side effects.  She wants to stay on this dosage and is not interested in increasing dosage at this time.  She reports sleep is restless only because her partner  has to get up early to go to work.  She currently does not have a spare room that she can use to sleep.  She however reports they are building a new house and she is planning to move in by March 2022.  Hopefully then she will be able to get a better night sleep since she will have more space.  She is currently in psychotherapy sessions and reports therapy sessions is beneficial.  She reports she is back at work however due to system problem she has not been able to do her job and is still waiting.  So far it is going okay.  She denies any suicidality, homicidality or perceptual disturbances.  Visit Diagnosis:    ICD-10-CM   1. MDD (major depressive disorder), recurrent, in partial remission (HCC)  F33.41   2. Bereavement  Z63.4   3. GAD (generalized anxiety disorder)  F41.1     Past Psychiatric History: I have reviewed past psychiatric history from my progress note from 04/11/2020.  Past trials of Lexapro, Abilify  Past Medical History:  Past Medical History:  Diagnosis Date  . Back pain   . Depression   . Migraine   . Palpitation   . Panic disorder   . RLS (restless legs syndrome)     Past Surgical History:  Procedure Laterality Date  . ABDOMINAL HYSTERECTOMY    . TUBAL LIGATION      Family Psychiatric History: I have reviewed family psychiatric history from my progress note on 04/11/2020  Family  History:  Family History  Problem Relation Age of Onset  . Heart disease Mother   . Asthma Mother   . Hypothyroidism Mother   . Bipolar disorder Mother   . Hypothyroidism Sister   . Diabetes Sister   . Hyperlipidemia Sister   . Asthma Son   . Heart disease Paternal Grandmother        Pacemaker  . Leukemia Paternal Grandmother   . Diabetes Paternal Grandmother   . Bipolar disorder Maternal Aunt   . Bipolar disorder Maternal Uncle     Social History: Reviewed social history from my progress note on 04/11/2020 Social History   Socioeconomic History  . Marital status:  Single    Spouse name: Not on file  . Number of children: 3  . Years of education: Not on file  . Highest education level: Not on file  Occupational History  . Occupation: Energy manager   Tobacco Use  . Smoking status: Former Smoker    Packs/day: 0.25    Years: 4.00    Pack years: 1.00    Types: Cigarettes    Start date: 05/04/2011    Quit date: 07/21/2020    Years since quitting: 0.2  . Smokeless tobacco: Never Used  Vaping Use  . Vaping Use: Never used  Substance and Sexual Activity  . Alcohol use: Yes    Alcohol/week: 0.0 standard drinks    Comment: occasionally  . Drug use: No  . Sexual activity: Yes    Partners: Male  Other Topics Concern  . Not on file  Social History Narrative   Lives with same person / boyfriend for the past 18 years   She has three son. 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    Youngest sone died at age 77 from Leukemia    Social Determinants of Health   Financial Resource Strain: Not on file  Food Insecurity: Not on file  Transportation Needs: Not on file  Physical Activity: Not on file  Stress: Not on file  Social Connections: Not on file    Allergies:  Allergies  Allergen Reactions  . Corylus   . Hydrocodone     Metabolic Disorder Labs: Lab Results  Component Value Date   HGBA1C 5.2 04/14/2020   MPG 105 07/24/2017   Lab Results  Component Value Date   PROLACTIN 21.7 04/14/2020   Lab Results  Component Value Date   CHOL 208 (H) 04/14/2020   TRIG 90 04/14/2020   HDL 61 04/14/2020   CHOLHDL 3.4 04/14/2020   LDLCALC 131 (H) 04/14/2020   LDLCALC 125 (H) 07/24/2017   Lab Results  Component Value Date   TSH 1.090 04/14/2020   TSH 1.05 07/24/2017    Therapeutic Level Labs: No results found for: LITHIUM No results found for: VALPROATE No components found for:  CBMZ  Current Medications: Current Outpatient Medications  Medication Sig Dispense  Refill  . ARIPiprazole (ABILIFY) 10 MG tablet TAKE 1 TABLET BY MOUTH EVERY DAY 90 tablet 1  . clonazePAM (KLONOPIN) 1 MG tablet Take 0.5-1 tablets (0.5-1 mg total) by mouth as directed. Start taking half tablet daily during the day as needed for anxiety attacks and one tablet at bedtime as needed for sleep and anxiety- please limit use 45 tablet 1  . mirtazapine (REMERON) 15 MG tablet TAKE 0.5-1 TABLETS (7.5-15 MG TOTAL) BY MOUTH AT BEDTIME. SLEEP AND MOOD 90 tablet 1  . Rimegepant  Sulfate (NURTEC) 75 MG TBDP Take 1 tablet by mouth every other day. 16 tablet 2  . topiramate (TOPAMAX) 100 MG tablet Take 1 tablet (100 mg total) by mouth 2 (two) times daily. Max of 100  Mg twice daily 180 tablet 0  . venlafaxine XR (EFFEXOR XR) 75 MG 24 hr capsule Take 1 capsule (75 mg total) by mouth daily with breakfast. 90 capsule 0  . Vitamin D, Ergocalciferol, (DRISDOL) 1.25 MG (50000 UNIT) CAPS capsule TAKE 1 CAPSULE (50,000 UNITS TOTAL) BY MOUTH EVERY 7 (SEVEN) DAYS. (NOT COVERED) 12 capsule 0   No current facility-administered medications for this visit.     Musculoskeletal: Strength & Muscle Tone: UTA Gait & Station: normal Patient leans: N/A  Psychiatric Specialty Exam: Review of Systems  Psychiatric/Behavioral: Positive for dysphoric mood and sleep disturbance.  All other systems reviewed and are negative.   There were no vitals taken for this visit.There is no height or weight on file to calculate BMI.  General Appearance: Casual  Eye Contact:  Fair  Speech:  Clear and Coherent  Volume:  Normal  Mood:  Depressed  Affect:  Congruent  Thought Process:  Goal Directed and Descriptions of Associations: Intact  Orientation:  Full (Time, Place, and Person)  Thought Content: Logical   Suicidal Thoughts:  No  Homicidal Thoughts:  No  Memory:  Immediate;   Fair Recent;   Fair Remote;   Fair  Judgement:  Fair  Insight:  Fair  Psychomotor Activity:  Normal  Concentration:  Concentration: Fair  and Attention Span: Fair  Recall:  [employer] of Knowledge: Fair  Language: Fair  Akathisia:  No  Handed:  Right  AIMS (if indicated): UTA  Assets:  Communication Skills Desire for Improvement Housing Social Support  ADL's:  Intact  Cognition: WNL  Sleep:  Poor   Screenings: GAD-7   Flowsheet Row Office Visit from 08/21/2020 in [facility] Office Visit from 08/03/2020 in [facility] from 05/18/2020 in [facility] Office Visit from 03/15/2020 in [facility] Office Visit from 02/02/2020 in [facility]  Total GAD-7 Score 16 21 21 21 21     PHQ2-9   Flowsheet Row Office Visit from 08/21/2020 in [facility] Video Visit from 08/07/2020 in [facility] Office Visit from 08/03/2020 in [facility] Office Visit from 06/13/2020 in [facility] Counselor from 05/18/2020 in [facility]  PHQ-2 Total Score 5 6 4 6 5   PHQ-9 Total Score 17 17 17 22 20        Assessment and Plan: Virginia Walls is a 39 year old African-American female, lives in [place], has a history of depression, anxiety, bereavement was evaluated by telemedicine today.  Patient is biologically predisposed due to her trauma, death in her family, history of mental health problems.  Patient continues to struggle with depressive symptoms although progressing.  Plan as noted below.  Plan MDD-improving Venlafaxine extended release 75 mg p.o. daily Abilify 10 mg p.o. daily Remeron 15 mg p.o. nightly Continue CBT with Ms.Hussami  GAD-improving Klonopin 0.5 mg as needed during the day, 1 mg at bedtime as she has been limiting use Venlafaxine as prescribed Remeron 15 mg p.o. nightly Continue CBT  Bereavement-improving Continue CBT  Provided supportive psychotherapy.  Follow-up in clinic in 4 weeks or  sooner if needed.  I have spent atleast 20 minutes face to face by video with patient today.  More than 50 % of the time was spent for preparing to see the patient ( e.g., review of test, records ),  ordering medications and test ,psychoeducation and supportive psychotherapy and care coordination,as well as documenting clinical information in electronic health record. This note was generated in part or whole with voice recognition software. Voice recognition is usually quite accurate but there are transcription errors that can and very often do occur. I apologize for any typographical errors that were not detected and corrected.         Jomarie Longs, MD 10/04/2020, 2:41 PM

## 2020-10-05 ENCOUNTER — Ambulatory Visit (INDEPENDENT_AMBULATORY_CARE_PROVIDER_SITE_OTHER): Payer: 59 | Admitting: Licensed Clinical Social Worker

## 2020-10-05 DIAGNOSIS — F3341 Major depressive disorder, recurrent, in partial remission: Secondary | ICD-10-CM | POA: Diagnosis not present

## 2020-10-05 DIAGNOSIS — Z634 Disappearance and death of family member: Secondary | ICD-10-CM | POA: Diagnosis not present

## 2020-10-05 NOTE — Progress Notes (Signed)
Virtual Visit via Video Note  I connected with Virginia Walls on 10/05/20 at  1:30 PM EST by a video enabled telemedicine application and verified that I am speaking with the correct person using two identifiers.  Location: Patient: home Provider: [facility]   I discussed the limitations of evaluation and management by telemedicine and the availability of in person appointments. The patient expressed understanding and agreed to proceed.   The patient was advised to call back or seek an in-person evaluation if the symptoms worsen or if the condition fails to improve as anticipated.  I provided 45 minutes of non-face-to-face time during this encounter.   Aislee Landgren R Xiomar Crompton, LCSW    THERAPIST PROGRESS NOTE  Session Time: 1:30-2:15p  Participation Level: Active  Behavioral Response: Neat and Well GroomedAlertDepressed  Type of Therapy: Individual Therapy  Treatment Goals addressed: Coping  Interventions: Supportive  Summary: Virginia Walls is a 39 y.o. female who presents with improving symptoms related to depression/bereavement. Pt reports that mood fluctuates, but is mostly stable. Pt reports that she feels she is managing grief as well as she can right now.  Allowed pt safe space to talk about her late son and current stage of grief. Pt explored her current thoughts and feelings (anger) and we related the anger to one of the primary stages of grief. 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    Pt reports that relationships are going well with spouse, children, and extended family (in laws). Pt reports that the holidays are difficult, but she is making it one day at a time. Pt expressed multiple emotions throughout session from happiness to tearfulness. Encouraged pt to continue focus on self care, mindfulness, and reviewed coping skills and stress management.  Emailed pt packet of Biochemist, clinical.   Suicidal/Homicidal: No  Therapist Response: Virginia Walls continues in the process of reworking and alleviation of traumatic pain and stress. Virginia Walls is able to describe details relating to traumatic events and use coping skills to manage emotional response. This is reflective of overall progress.   Plan: Return again in 4 weeks.  Diagnosis: Axis I: Bereavement and Major Depression, Recurrent severe    Axis II: No diagnosis    Ernest Haber Reagan Behlke, LCSW 10/05/2020

## 2020-10-06 ENCOUNTER — Telehealth: Payer: Self-pay

## 2020-10-06 NOTE — Telephone Encounter (Signed)
Medication management - Form received questioning if patient had the capacity to work 09/20/20-09/29/20 that needs to be completed and returned.

## 2020-10-06 NOTE — Telephone Encounter (Signed)
Completed form, will place in folder at the entrance of Nurses office.Please let whoever is covering know to fax this Thank you.  They also need last two office notes to be faxed.

## 2020-10-09 ENCOUNTER — Telehealth: Payer: Self-pay | Admitting: *Deleted

## 2020-10-09 NOTE — Telephone Encounter (Signed)
Thank you :)

## 2020-10-09 NOTE — Telephone Encounter (Signed)
Patient to email ROI so information can be sent.

## 2020-10-09 NOTE — Telephone Encounter (Signed)
Telrphone call to patient to request email to send ROI for [employer]. Email: [email or web address] ROI sent by front desk staff.

## 2020-10-19 ENCOUNTER — Other Ambulatory Visit: Payer: Self-pay

## 2020-10-19 ENCOUNTER — Ambulatory Visit (INDEPENDENT_AMBULATORY_CARE_PROVIDER_SITE_OTHER): Payer: 59 | Admitting: Licensed Clinical Social Worker

## 2020-10-19 DIAGNOSIS — F3341 Major depressive disorder, recurrent, in partial remission: Secondary | ICD-10-CM | POA: Diagnosis not present

## 2020-10-19 DIAGNOSIS — Z634 Disappearance and death of family member: Secondary | ICD-10-CM | POA: Diagnosis not present

## 2020-10-19 NOTE — Progress Notes (Signed)
Virtual Visit via Video Note  I connected with Amie Critchley Conchas on 10/19/20 at 12:30 PM EST by a video enabled telemedicine application and verified that I am speaking with the correct person using two identifiers.  Location: Patient: home Provider: remote office [place], [place])   I discussed the limitations of evaluation and management by telemedicine and the availability of in person appointments. The patient expressed understanding and agreed to proceed.   The patient was advised to call back or seek an in-person evaluation if the symptoms worsen or if the condition fails to improve as anticipated.  I provided 60 minutes of non-face-to-face time during this encounter.   Rasa Degrazia R Breahna Boylen, LCSW    THERAPIST PROGRESS NOTE  Session Time: 12:30-1:30p  Participation Level: Active  Behavioral Response: NAAlertDepressed  Type of Therapy: Individual Therapy  Treatment Goals addressed: Coping  Interventions: Supportive, Family Systems and Other: grief counseling  Summary: Virginia Walls is a 39 y.o. female who presents with symptoms consistent with depression and bereavement diagnoses. Pt states that she is getting fair quality and quantity of sleep.  Allowed pt safe space to explore thoughts and feelings about continuing grief process. Pt feels that she is still in the bargaining stage: has a lot of "what if's" that are popping into her mind. Discussed husband's grieving and how he gets irritable if he sees pt crying, or her middle son crying. Encouraged pt to have her "safe spaces" where she can express her emotions without fear of being reprimanded. Pt states that she is ready to make some positive changes in her life and that includes being direct in how she communicates with others--regardless of how it may make them feel. Discussed assertive communication styles and other personal goals patient would like to see in the future. Jupiter is starting a new position in January and is  very excited about starting something new "its a new chapter".    Allowed pt to discuss grief process and describe events leading up to son's death in detail. Pt got emotional, but it seems that it is getting easier for patient to describe events surrounding son's death without it triggering extreme emotional response. Discussed emotion regulation and pt feels that she is doing much better with managing emotions.  Discussed relationship with husband and reviewed some relationship-building tips with pt.  Clinician will email pt psychoeducational resources about grief management.  Encouraged continuing self care, life management, and life balance.   Suicidal/Homicidal: No  Therapist Response: Rilyn is demonstrating a growing capacity for overall mood and stress management, and is improving in her assertive communication skills. Pt is demonstrating gains in self assertion and positive self promotion. Treatment continuing to show good evolution and development.  Plan: Return again in 3-4 weeks.  Diagnosis: Axis I: Bereavement and Major Depression, Recurrent severe    Axis II: No diagnosis    Ernest Haber Kateland Leisinger, LCSW 10/19/2020

## 2020-10-31 NOTE — Progress Notes (Deleted)
Name: Virginia Walls   MRN: [id number]    DOB: [birth year]   Date:10/31/2020       Progress Note  Subjective  Chief Complaint  Follow up  HPI  Grief/Major depression recurrent: she states first episode of depression was after the birth of her second son she was given medications but was not compliant with it, she states she never felt back to normal, she states keeps things to herself and cannot handle stress well, she states she was depressed before her son got the diagnosis of leukemia back in 21-Oct-2019 and died on Feb 11, 2020 .Marland Kitchen  She states episodes in the past  Were never as bad  She has been on FMLA since he was [facility] to [facility] October 21, 2019. She was seen in April 2021  and had been  crying daily for the previous months. She was having  poor appetite, could not  sleep at night, had suicidal thoughts but no planning. We started her on Lexapro but symptoms did not improve and she started to have visual hallucinations.  She was referred to  Dr. Elna Breslow first appointment was in June 2021and is also seeing a therapist once a week now . She is off Lexapro and taking Abilify 10 mg and Remeron at night  She is able to care for her hygiene, she has poor appetite but makes herself eat. She was released back to work September 2021, she states she is doing the same job but getting re-trained because she was gone for a long time. 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 She states she still has days that she spends the day crying , her boss has been accommodating and switching her to training instead of direct customer contact when she has uncontrollable crying spells, but she needs the [employer] disability forms filled out for intermittent leave to allow her to take time off for office visits but also for the days that she cannot stop crying.   Patient Active Problem List   Diagnosis Date Noted  . MDD (major depressive disorder), recurrent, in partial  remission (HCC) 10/04/2020  . MDD (major depressive disorder), recurrent, severe, with psychosis (HCC) 04/11/2020  . GAD (generalized anxiety disorder) 04/11/2020  . Bereavement 04/11/2020  . At risk for prolonged QT interval syndrome 04/11/2020  . High risk medication use 04/11/2020  . Anxiety 11/13/2017  . Chronic constipation 08/12/2017  . Hyperglycemia 05/04/2015  . Tattoos 05/04/2015  . Headache, migraine 04/29/2015  . Excess weight 04/29/2015  . Restless leg 04/29/2015  . Snores 04/29/2015  . Vitamin D deficiency 04/29/2015    Past Surgical History:  Procedure Laterality Date  . ABDOMINAL HYSTERECTOMY    . TUBAL LIGATION      Family History  Problem Relation Age of Onset  . Heart disease Mother   . Asthma Mother   . Hypothyroidism Mother   . Bipolar disorder Mother   . Hypothyroidism Sister   . Diabetes Sister   . Hyperlipidemia Sister   . Asthma Son   . Heart disease Paternal Grandmother        Pacemaker  . Leukemia Paternal Grandmother   . Diabetes Paternal Grandmother   . Bipolar disorder Maternal Aunt   . Bipolar disorder Maternal Uncle     Social History   Tobacco Use  . Smoking status: Former Smoker    Packs/day: 0.25    Years: 4.00    Pack  years: 1.00    Types: Cigarettes    Start date: 05/04/2011    Quit date: 07/21/2020    Years since quitting: 0.2  . Smokeless tobacco: Never Used  Substance Use Topics  . Alcohol use: Yes    Alcohol/week: 0.0 standard drinks    Comment: occasionally     Current Outpatient Medications:  .  ARIPiprazole (ABILIFY) 10 MG tablet, TAKE 1 TABLET BY MOUTH EVERY DAY, Disp: 90 tablet, Rfl: 1 .  clonazePAM (KLONOPIN) 1 MG tablet, Take 0.5-1 tablets (0.5-1 mg total) by mouth as directed. Start taking half tablet daily during the day as needed for anxiety attacks and one tablet at bedtime as needed for sleep and anxiety- please limit use, Disp: 45 tablet, Rfl: 1 .  mirtazapine (REMERON) 15 MG tablet, TAKE 0.5-1 TABLETS  (7.5-15 MG TOTAL) BY MOUTH AT BEDTIME. SLEEP AND MOOD, Disp: 90 tablet, Rfl: 1 .  Rimegepant Sulfate (NURTEC) 75 MG TBDP, Take 1 tablet by mouth every other day., Disp: 16 tablet, Rfl: 2 .  topiramate (TOPAMAX) 100 MG tablet, Take 1 tablet (100 mg total) by mouth 2 (two) times daily. Max of 100  Mg twice daily, Disp: 180 tablet, Rfl: 0 .  venlafaxine XR (EFFEXOR XR) 75 MG 24 hr capsule, Take 1 capsule (75 mg total) by mouth daily with breakfast., Disp: 90 capsule, Rfl: 0 .  Vitamin D, Ergocalciferol, (DRISDOL) 1.25 MG (50000 UNIT) CAPS capsule, TAKE 1 CAPSULE (50,000 UNITS TOTAL) BY MOUTH EVERY 7 (SEVEN) DAYS. (NOT COVERED), Disp: 12 capsule, Rfl: 0  Allergies  Allergen Reactions  . Corylus   . Hydrocodone     I personally reviewed {Reviewed:14835} with the patient/caregiver today.   ROS  ***  Objective  There were no vitals filed for this visit.  There is no height or weight on file to calculate BMI.  Physical Exam ***  No results found for this or any previous visit (from the past 2160 hour(s)).  Diabetic Foot Exam: Diabetic Foot Exam - Simple   No data filed    ***  PHQ2/9: Depression screen [facility] 2/9 08/21/2020 08/03/2020 06/13/2020 03/15/2020 02/02/2020  Decreased Interest 2 2 3 3 2   Down, Depressed, Hopeless 3 2 3 3 3   PHQ - 2 Score 5 4 6 6 5   Altered sleeping 2 2 3 3 3   Tired, decreased energy 2 2 3 3 3   Change in appetite 2 2 3 3 3   Feeling bad or failure about yourself  2 2 2 3 3   Trouble concentrating 2 2 2 3 3   Moving slowly or fidgety/restless 2 2 3 3 3   Suicidal thoughts 0 1 0 2 1  PHQ-9 Score 17 17 22 26 24   Difficult doing work/chores Somewhat difficult Very difficult Somewhat difficult Extremely dIfficult Extremely dIfficult  Some encounter information is confidential and restricted. Go to Review Flowsheets activity to see all data.  Some recent data might be hidden    phq 9 is {gen pos ***  Fall Risk: Fall Risk  08/21/2020 08/03/2020  06/13/2020 03/15/2020 02/02/2020  Falls in the past year? 1 1 1  0 0  Number falls in past yr: 1 1 1  0 0  Injury with Fall? 1 1 1  0 0  Risk for fall due to : History of fall(s) History of fall(s) - - -   ***   Functional Status Survey:   ***   Assessment & Plan  *** There are no diagnoses linked to this encounter.

## 2020-11-01 ENCOUNTER — Ambulatory Visit: Payer: Managed Care, Other (non HMO) | Admitting: Family Medicine

## 2020-11-02 ENCOUNTER — Ambulatory Visit: Payer: 59 | Admitting: Licensed Clinical Social Worker

## 2020-11-08 ENCOUNTER — Ambulatory Visit (INDEPENDENT_AMBULATORY_CARE_PROVIDER_SITE_OTHER): Payer: 59 | Admitting: Licensed Clinical Social Worker

## 2020-11-08 ENCOUNTER — Other Ambulatory Visit: Payer: Self-pay

## 2020-11-08 ENCOUNTER — Telehealth: Payer: 59 | Admitting: Psychiatry

## 2020-11-08 DIAGNOSIS — Z634 Disappearance and death of family member: Secondary | ICD-10-CM

## 2020-11-08 DIAGNOSIS — F3341 Major depressive disorder, recurrent, in partial remission: Secondary | ICD-10-CM | POA: Diagnosis not present

## 2020-11-08 NOTE — Progress Notes (Signed)
Virtual Visit via Video Note  I connected with Virginia Walls on 11/08/20 at  4:00 PM EST by a video enabled telemedicine application and verified that I am speaking with the correct person using two identifiers.  Video connection was lost when less than 50% of the duration of the visit was complete, at which time the remainder of the visit was completed via audio only.  Location: Patient: home Provider: remote office [place], [place])   I discussed the limitations of evaluation and management by telemedicine and the availability of in person appointments. The patient expressed understanding and agreed to proceed.  The patient was advised to call back or seek an in-person evaluation if the symptoms worsen or if the condition fails to improve as anticipated.  I provided 45 minutes of non-face-to-face time during this encounter.   Jevan Gaunt R Olanda Boughner, LCSW    THERAPIST PROGRESS NOTE  Session Time: 4-4:45p  Participation Level: Active  Behavioral Response: NeatAlertAnxious and Depressed  Type of Therapy: Individual Therapy  Treatment Goals addressed: Coping  Interventions: CBT and Supportive  Summary: Virginia Walls is a 40 y.o. female who presents with improving symptoms related to depression and bereavement diagnoses. Pt reports that mood has been stable and that she is managing stress and anxiety well. Pt reports fair quality and quantity of sleep. Reviewed sleep hygiene with patient.  Allowed pt to express thoughts and feelings related to new job--pt is feeling a significant amount of stress relief from the recent job shift. 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   Discussed relationship with husband--pt is feeling good that they are being intentional about spending more quality time together. Pts husband just recently got a new job and he chose hours that are more aligned  with Virginia Walls hours so that they will be able to spend more time together. Virginia Walls was very happy about this and this reflect his dedication to her. Virginia Walls is continuing to worry about her middle son--she feels that he needs counseling. Encouraged pt to research counselors in county and that [employer] counselor could send her local resources upon request.   Discussed pts continuing journey through her grief--pt is finally able to feel happiness again sometimes "Its been a rough road". Gave pt unconditional positive regard and acceptance as she shared some continuing thoughts and feelings related to the death of her son.  Encouraged pt to continue with self care, positive social engagement, and life balance. Reviewed importance of physical activity and sleep hygiene tips..   Suicidal/Homicidal: No  Therapist Response: Virginia Walls was very engaged throughout session and is trying hard to manage stress and regulate moods. Pt continues to make good progress with mending and maintaining personal relationships and setting healthy boundaries. Pt is processing through grief well and is reaching out for supports when needed. Pt is using coping mechanisms when needed. Treatment showing good progress and development.   Plan: Return again in 4 weeks.  Diagnosis: Axis I: Bereavement and Major Depression, Recurrent severe    Axis II: No diagnosis    Ernest Haber Kalynn Declercq, LCSW 11/08/2020

## 2020-11-09 NOTE — Progress Notes (Deleted)
Name: Virginia Walls   MRN: [id number]    DOB: 20-Jan-1981   Date:11/09/2020       Progress Note  Subjective  Chief Complaint  Follow up   HPI Grief/Major depression recurrent: she states first episode of depression was after the birth of her second son she was given medications but was not compliant with it, she states she never felt back to normal, she states keeps things to herself and cannot handle stress well, she states she was depressed before her son got the diagnosis of leukemia back in 25-Oct-2019 and died on February 15, 2020 .Marland Kitchen  She states episodes in the past  Were never as bad  She has been on FMLA since he was admitted to [facility] 10-25-2019. She was seen in April 2021  and had been  crying daily for the previous months. She was having  poor appetite, could not  sleep at night, had suicidal thoughts but no planning. We started her on Lexapro but symptoms did not improve and she started to have visual hallucinations.  She was referred to  Dr. Elna Breslow first appointment was in June 2021and is also seeing a therapist once a week now . She is off Lexapro and taking Abilify 10 mg and Remeron at night  She is able to care for her hygiene, she has poor appetite but makes herself eat. She was released back to work September 2021, she states she is doing the same job but getting re-trained because she was gone for a long time. 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 She states she still has days that she spends the day crying , her boss has been accommodating and switching her to training instead of direct customer contact when she has uncontrollable crying spells, but she needs the [employer] disability forms filled out for intermittent leave to allow her to take time off for office visits but also for the days that she cannot stop crying.    *** Patient Active Problem List   Diagnosis Date Noted  . MDD (major depressive disorder), recurrent, in  partial remission (HCC) 10/04/2020  . MDD (major depressive disorder), recurrent, severe, with psychosis (HCC) 04/11/2020  . GAD (generalized anxiety disorder) 04/11/2020  . Bereavement 04/11/2020  . At risk for prolonged QT interval syndrome 04/11/2020  . High risk medication use 04/11/2020  . Anxiety 11/13/2017  . Chronic constipation 08/12/2017  . Hyperglycemia 05/04/2015  . Tattoos 05/04/2015  . Headache, migraine 04/29/2015  . Excess weight 04/29/2015  . Restless leg 04/29/2015  . Snores 04/29/2015  . Vitamin D deficiency 04/29/2015    Past Surgical History:  Procedure Laterality Date  . ABDOMINAL HYSTERECTOMY    . TUBAL LIGATION      Family History  Problem Relation Age of Onset  . Heart disease Mother   . Asthma Mother   . Hypothyroidism Mother   . Bipolar disorder Mother   . Hypothyroidism Sister   . Diabetes Sister   . Hyperlipidemia Sister   . Asthma Son   . Heart disease Paternal Grandmother        Pacemaker  . Leukemia Paternal Grandmother   . Diabetes Paternal Grandmother   . Bipolar disorder Maternal Aunt   . Bipolar disorder Maternal Uncle     Social History   Tobacco Use  . Smoking status: Former Smoker    Packs/day: 0.25    Years: 4.00  Pack years: 1.00    Types: Cigarettes    Start date: 05/04/2011    Quit date: 07/21/2020    Years since quitting: 0.3  . Smokeless tobacco: Never Used  Substance Use Topics  . Alcohol use: Yes    Alcohol/week: 0.0 standard drinks    Comment: occasionally     Current Outpatient Medications:  .  ARIPiprazole (ABILIFY) 10 MG tablet, TAKE 1 TABLET BY MOUTH EVERY DAY, Disp: 90 tablet, Rfl: 1 .  clonazePAM (KLONOPIN) 1 MG tablet, Take 0.5-1 tablets (0.5-1 mg total) by mouth as directed. Start taking half tablet daily during the day as needed for anxiety attacks and one tablet at bedtime as needed for sleep and anxiety- please limit use, Disp: 45 tablet, Rfl: 1 .  mirtazapine (REMERON) 15 MG tablet, TAKE 0.5-1  TABLETS (7.5-15 MG TOTAL) BY MOUTH AT BEDTIME. SLEEP AND MOOD, Disp: 90 tablet, Rfl: 1 .  Rimegepant Sulfate (NURTEC) 75 MG TBDP, Take 1 tablet by mouth every other day., Disp: 16 tablet, Rfl: 2 .  topiramate (TOPAMAX) 100 MG tablet, Take 1 tablet (100 mg total) by mouth 2 (two) times daily. Max of 100  Mg twice daily, Disp: 180 tablet, Rfl: 0 .  venlafaxine XR (EFFEXOR XR) 75 MG 24 hr capsule, Take 1 capsule (75 mg total) by mouth daily with breakfast., Disp: 90 capsule, Rfl: 0 .  Vitamin D, Ergocalciferol, (DRISDOL) 1.25 MG (50000 UNIT) CAPS capsule, TAKE 1 CAPSULE (50,000 UNITS TOTAL) BY MOUTH EVERY 7 (SEVEN) DAYS. (NOT COVERED), Disp: 12 capsule, Rfl: 0  Allergies  Allergen Reactions  . Corylus   . Hydrocodone     I personally reviewed {Reviewed:14835} with the patient/caregiver today.   ROS  ***  Objective  There were no vitals filed for this visit.  There is no height or weight on file to calculate BMI.  Physical Exam ***  No results found for this or any previous visit (from the past 2160 hour(s)).  Diabetic Foot Exam: Diabetic Foot Exam - Simple   No data filed    ***  PHQ2/9: Depression screen [facility] 2/9 08/21/2020 08/03/2020 06/13/2020 03/15/2020 02/02/2020  Decreased Interest 2 2 3 3 2   Down, Depressed, Hopeless 3 2 3 3 3   PHQ - 2 Score 5 4 6 6 5   Altered sleeping 2 2 3 3 3   Tired, decreased energy 2 2 3 3 3   Change in appetite 2 2 3 3 3   Feeling bad or failure about yourself  2 2 2 3 3   Trouble concentrating 2 2 2 3 3   Moving slowly or fidgety/restless 2 2 3 3 3   Suicidal thoughts 0 1 0 2 1  PHQ-9 Score 17 17 22 26 24   Difficult doing work/chores Somewhat difficult Very difficult Somewhat difficult Extremely dIfficult Extremely dIfficult  Some encounter information is confidential and restricted. Go to Review Flowsheets activity to see all data.  Some recent data might be hidden    phq 9 is {gen pos ***  Fall Risk: Fall Risk  08/21/2020  08/03/2020 06/13/2020 03/15/2020 02/02/2020  Falls in the past year? 1 1 1  0 0  Number falls in past yr: 1 1 1  0 0  Injury with Fall? 1 1 1  0 0  Risk for fall due to : History of fall(s) History of fall(s) - - -   ***   Functional Status Survey:   ***   Assessment & Plan  *** There are no diagnoses linked to this encounter.

## 2020-11-13 ENCOUNTER — Ambulatory Visit: Payer: Self-pay | Admitting: Family Medicine

## 2020-11-15 ENCOUNTER — Other Ambulatory Visit: Payer: Self-pay

## 2020-11-15 ENCOUNTER — Ambulatory Visit: Payer: 59 | Admitting: Licensed Clinical Social Worker

## 2020-12-13 ENCOUNTER — Ambulatory Visit (INDEPENDENT_AMBULATORY_CARE_PROVIDER_SITE_OTHER): Payer: 59 | Admitting: Licensed Clinical Social Worker

## 2020-12-13 ENCOUNTER — Other Ambulatory Visit: Payer: Self-pay

## 2020-12-13 DIAGNOSIS — F3341 Major depressive disorder, recurrent, in partial remission: Secondary | ICD-10-CM

## 2020-12-13 DIAGNOSIS — Z634 Disappearance and death of family member: Secondary | ICD-10-CM

## 2020-12-14 NOTE — Progress Notes (Signed)
Virtual Visit via Video Note  I connected with Virginia Walls on 12/13/20 at  4:00 PM EST by a video enabled telemedicine application and verified that I am speaking with the correct person using two identifiers.  Location: Patient: home Provider: [facility]   I discussed the limitations of evaluation and management by telemedicine and the availability of in person appointments. The patient expressed understanding and agreed to proceed.    I discussed the assessment and treatment plan with the patient. The patient was provided an opportunity to ask questions and all were answered. The patient agreed with the plan and demonstrated an understanding of the instructions.   The patient was advised to call back or seek an in-person evaluation if the symptoms worsen or if the condition fails to improve as anticipated.  I provided 60 minutes of non-face-to-face time during this encounter.   Kartier Bennison R Trenton Verne, LCSW    THERAPIST PROGRESS NOTE  Session Time: 4-5p  Participation Level: Active  Behavioral Response: Neat and Well GroomedAlertDepressed  Type of Therapy: Individual Therapy  Treatment Goals addressed: Coping  Interventions: CBT, Supportive and Other: trauma focused, grief counseling  Summary: Virginia Walls is a 40 y.o. female who presents with continuing symptoms related to PTSD/Grief diagnosis. Pt reports that overall mood has improved and that pt is managing stress and anxiety well.   Alvis reports that she is loving her new job position--pt states that it is such an improvement over her previous position. Allowed pt to compare and contrast job roles and overall psychological impact of the change. Pt reporting that it is a positive change right now.  Allowed pt safe space to discuss thoughts and feelings related to death of son. Anniversary is coming up soon and pt is dreading it. Pt feels "blah" most days and feels like she's just "going through the motions to get through the  day".   Allowed pt to explore relationships with several family members. Pt has cruise coming up (2 wks)  and is excited to spend quality time with family members.   Encouraged pt to continue focusing on self care, positive social supports, and life balance.   Suicidal/Homicidal: No  Therapist Response: Kennidy is continuing to utilize coping skills that she has learned to help manage on bad days. Maryln is reporting a decrease in symptoms, which is reflective of progress. Donia is being intentional about engaging in recreational activities to boost mood. These behaviors are reflective of continuing growth and progress. Treatment to continue as indicated.   Plan: Return again in 4 weeks.  Diagnosis: Axis I: MDD, recurrent, in partial remission; bereavement    Axis II: No diagnosis    Ernest Haber Recie Cirrincione, LCSW 12/14/2020

## 2021-01-01 ENCOUNTER — Other Ambulatory Visit: Payer: Self-pay | Admitting: Family Medicine

## 2021-01-01 DIAGNOSIS — E559 Vitamin D deficiency, unspecified: Secondary | ICD-10-CM

## 2021-01-01 NOTE — Telephone Encounter (Signed)
Requested medication (s) are due for refill today:   Provider to review  Requested medication (s) are on the active medication list:   Yes  Future visit scheduled:   No   Last ordered: 07/07/2020 #12, 0 refills  Non delegated refill    Requested Prescriptions  Pending Prescriptions Disp Refills   Vitamin D, Ergocalciferol, (DRISDOL) 1.25 MG (50000 UNIT) CAPS capsule [Pharmacy Med Name: VITAMIN D2 1.25MG (50,000 UNIT)] 4 capsule 2    Sig: TAKE 1 CAPSULE (50,000 UNITS TOTAL) BY MOUTH EVERY 7 (SEVEN) DAYS.      Endocrinology:  Vitamins - Vitamin D Supplementation Failed - 01/01/2021  2:30 PM      Failed - 50,000 IU strengths are not delegated      Failed - Ca in normal range and within 360 days    Calcium  Date Value Ref Range Status  07/24/2017 9.1 8.6 - 10.2 mg/dL Final   Calcium, Total  Date Value Ref Range Status  08/20/2013 9.1 8.5 - 10.1 mg/dL Final          Failed - Phosphate in normal range and within 360 days    No results found for: PHOS        Failed - Vitamin D in normal range and within 360 days    Vit D, 25-Hydroxy  Date Value Ref Range Status  07/24/2017 17 (L) 30 - 100 ng/mL Final    Comment:    Vitamin D Status         25-OH Vitamin D: . Deficiency:                    <20 ng/mL Insufficiency:             20 - 29 ng/mL Optimal:                 > or = 30 ng/mL . For 25-OH Vitamin D testing on patients on  D2-supplementation and patients for whom quantitation  of D2 and D3 fractions is required, the QuestAssureD(TM) 25-OH VIT D, (D2,D3), LC/MS/MS is recommended: order  code 27062 (patients >40yrs). . For more information on this test, go to: http://education.questdiagnostics.com/faq/FAQ163 (This link is being provided for  informational/educational purposes only.)           Passed - Valid encounter within last 12 months    Recent Outpatient Visits           4 months ago MDD (major depressive disorder), recurrent, severe, with psychosis (HCC)    [facility] [place], Danna Hefty, MD   5 months ago MDD (major depressive disorder), recurrent, severe, with psychosis [facility])   [facility], Danna Hefty, MD   6 months ago MDD (major depressive disorder), recurrent, severe, with psychosis [facility])   [facility] Cory, MD   9 months ago Grief at loss of child   [facility] Alba Cory, MD   11 months ago Chronic pain of right ankle   [facility] Alba Cory, MD

## 2021-01-04 ENCOUNTER — Other Ambulatory Visit: Payer: Self-pay | Admitting: Psychiatry

## 2021-01-04 DIAGNOSIS — F3341 Major depressive disorder, recurrent, in partial remission: Secondary | ICD-10-CM

## 2021-01-04 DIAGNOSIS — F411 Generalized anxiety disorder: Secondary | ICD-10-CM

## 2021-01-16 ENCOUNTER — Telehealth (HOSPITAL_COMMUNITY): Payer: Self-pay | Admitting: Psychiatry

## 2021-01-16 ENCOUNTER — Other Ambulatory Visit: Payer: Self-pay

## 2021-01-16 ENCOUNTER — Telehealth (INDEPENDENT_AMBULATORY_CARE_PROVIDER_SITE_OTHER): Payer: 59 | Admitting: Psychiatry

## 2021-01-16 ENCOUNTER — Ambulatory Visit (INDEPENDENT_AMBULATORY_CARE_PROVIDER_SITE_OTHER): Payer: 59 | Admitting: Licensed Clinical Social Worker

## 2021-01-16 ENCOUNTER — Encounter: Payer: Self-pay | Admitting: Psychiatry

## 2021-01-16 DIAGNOSIS — F333 Major depressive disorder, recurrent, severe with psychotic symptoms: Secondary | ICD-10-CM

## 2021-01-16 DIAGNOSIS — F331 Major depressive disorder, recurrent, moderate: Secondary | ICD-10-CM

## 2021-01-16 DIAGNOSIS — Z634 Disappearance and death of family member: Secondary | ICD-10-CM | POA: Diagnosis not present

## 2021-01-16 DIAGNOSIS — F411 Generalized anxiety disorder: Secondary | ICD-10-CM

## 2021-01-16 DIAGNOSIS — F1010 Alcohol abuse, uncomplicated: Secondary | ICD-10-CM | POA: Diagnosis not present

## 2021-01-16 DIAGNOSIS — Z9114 Patient's other noncompliance with medication regimen: Secondary | ICD-10-CM

## 2021-01-16 MED ORDER — VENLAFAXINE HCL ER 75 MG PO CP24: 75.0000 mg | ORAL_CAPSULE | Freq: Every day | ORAL | 1 refills | Status: DC

## 2021-01-16 NOTE — Telephone Encounter (Signed)
D:  Christina Hussami, LCSW referred pt to virtual MH-IOP.  A:  Placed call to orient patient, but there was no answer.  Left vm for pt to call the case manager back.  Inform Christina, LCSW.

## 2021-01-16 NOTE — Progress Notes (Signed)
Virtual Visit via Video Note  I connected with Amie Critchley Bazaldua on 01/16/21 at  4:20 PM EDT by a video enabled telemedicine application and verified that I am speaking with the correct person using two identifiers.  Location Provider Location : [facility] Patient Location : Home  Participants: Patient , Provider     I discussed the limitations of evaluation and management by telemedicine and the availability of in person appointments. The patient expressed understanding and agreed to proceed.    I discussed the assessment and treatment plan with the patient. The patient was provided an opportunity to ask questions and all were answered. The patient agreed with the plan and demonstrated an understanding of the instructions.   The patient was advised to call back or seek an in-person evaluation if the symptoms worsen or if the condition fails to improve as anticipated.   BH MD OP Progress Note  01/16/2021 6:28 PM BILLEE BALCERZAK  MRN:  [id number]  Chief Complaint:  Chief Complaint    Follow-up; Depression     HPI: Virginia Walls is a 40 year old African-American female, employed, lives in [place], married, has a history of MDD, bereavement, GAD, migraine headaches, restless leg syndrome, Achillis tendinitis was evaluated by telemedicine today.  Patient today reports she is currently struggling with sadness, crying spells.  She reports she is finding it very difficult to function at work since the past few days.  She reports she has these intense emotions and its very hard for her to cope with it.  Patient became tearful when she discussed it.  She reports it is her son's death anniversary coming up on April 2.  As the day gets closer it is getting harder for her.    Patient reports she stopped taking all her medications few weeks ago.  She reports she had gone out of town and had to stop it for a few days and then decided not to take it.  Patient reports sleep as restless.  While she was  taking the medication for sleep-mirtazapine she was sleeping okay.  Patient denies any suicidality, homicidality or perceptual disturbances.  Patient does report she has been drinking more recently.  She has been trying to self medicate herself.  She reports she drinks 2 glasses of wine every day a few hours apart.  This has been getting worse since the past few weeks.  Patient denies any other concerns today.  Visit Diagnosis:    ICD-10-CM   1. MDD (major depressive disorder), recurrent episode, moderate (HCC)  F33.1 venlafaxine XR (EFFEXOR XR) 75 MG 24 hr capsule  2. Bereavement  Z63.4   3. GAD (generalized anxiety disorder)  F41.1 venlafaxine XR (EFFEXOR XR) 75 MG 24 hr capsule  4. Alcohol abuse  F10.10    R/O Alcohol use disorder  5. Noncompliance with medication regimen  Z91.14     Past Psychiatric History: I have reviewed past psychiatric history from my progress note on 04/11/2020.  Past trials of Lexapro, Abilify  Past Medical History:  Past Medical History:  Diagnosis Date  . Back pain   . Depression   . Migraine   . Palpitation   . Panic disorder   . RLS (restless legs syndrome)     Past Surgical History:  Procedure Laterality Date  . ABDOMINAL HYSTERECTOMY    . TUBAL LIGATION      Family Psychiatric History: I have reviewed family psychiatric history from my progress note on 04/11/2020  Family History:  Family History  Problem Relation Age of Onset  . Heart disease Mother   . Asthma Mother   . Hypothyroidism Mother   . Bipolar disorder Mother   . Hypothyroidism Sister   . Diabetes Sister   . Hyperlipidemia Sister   . Asthma Son   . Heart disease Paternal Grandmother        Pacemaker  . Leukemia Paternal Grandmother   . Diabetes Paternal Grandmother   . Bipolar disorder Maternal Aunt   . Bipolar disorder Maternal Uncle     Social History: I have reviewed social history from my progress note on 04/11/2020 Social History   Socioeconomic History  .  Marital status: Single    Spouse name: Not on file  . Number of children: 3  . Years of education: Not on file  . Highest education level: Not on file  Occupational History  . Occupation: Energy manager   Tobacco Use  . Smoking status: Former Smoker    Packs/day: 0.25    Years: 4.00    Pack years: 1.00    Types: Cigarettes    Start date: 05/04/2011    Quit date: 07/21/2020    Years since quitting: 0.4  . Smokeless tobacco: Never Used  Vaping Use  . Vaping Use: Never used  Substance and Sexual Activity  . Alcohol use: Yes    Alcohol/week: 0.0 standard drinks    Comment: occasionally  . Drug use: No  . Sexual activity: Yes    Partners: Male  Other Topics Concern  . Not on file  Social History Narrative   Lives with same person / boyfriend for the past 18 years   She has three son. 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    Youngest sone died at age 74 from Leukemia    Social Determinants of Health   Financial Resource Strain: Not on file  Food Insecurity: Not on file  Transportation Needs: Not on file  Physical Activity: Not on file  Stress: Not on file  Social Connections: Not on file    Allergies:  Allergies  Allergen Reactions  . Corylus   . Hydrocodone     Metabolic Disorder Labs: Lab Results  Component Value Date   HGBA1C 5.2 04/14/2020   MPG 105 07/24/2017   Lab Results  Component Value Date   PROLACTIN 21.7 04/14/2020   Lab Results  Component Value Date   CHOL 208 (H) 04/14/2020   TRIG 90 04/14/2020   HDL 61 04/14/2020   CHOLHDL 3.4 04/14/2020   LDLCALC 131 (H) 04/14/2020   LDLCALC 125 (H) 07/24/2017   Lab Results  Component Value Date   TSH 1.090 04/14/2020   TSH 1.05 07/24/2017    Therapeutic Level Labs: No results found for: LITHIUM No results found for: VALPROATE No components found for:  CBMZ  Current Medications: Current Outpatient Medications  Medication  Sig Dispense Refill  . ARIPiprazole (ABILIFY) 10 MG tablet TAKE 1 TABLET BY MOUTH EVERY DAY 90 tablet 1  . clonazePAM (KLONOPIN) 1 MG tablet Take 0.5-1 tablets (0.5-1 mg total) by mouth as directed. Start taking half tablet daily during the day as needed for anxiety attacks and one tablet at bedtime as needed for sleep and anxiety- please limit use 45 tablet 1  . mirtazapine (REMERON) 15 MG tablet TAKE 0.5-1 TABLETS (7.5-15 MG TOTAL) BY MOUTH AT BEDTIME. SLEEP AND MOOD 90 tablet 1  . Rimegepant Sulfate (NURTEC) 75  MG TBDP Take 1 tablet by mouth every other day. 16 tablet 2  . topiramate (TOPAMAX) 100 MG tablet Take 1 tablet (100 mg total) by mouth 2 (two) times daily. Max of 100  Mg twice daily 180 tablet 0  . venlafaxine XR (EFFEXOR XR) 75 MG 24 hr capsule Take 1 capsule (75 mg total) by mouth daily with breakfast. 30 capsule 1  . Vitamin D, Ergocalciferol, (DRISDOL) 1.25 MG (50000 UNIT) CAPS capsule TAKE 1 CAPSULE (50,000 UNITS TOTAL) BY MOUTH EVERY 7 (SEVEN) DAYS. (NOT COVERED) 12 capsule 0   No current facility-administered medications for this visit.     Musculoskeletal: Strength & Muscle Tone: UTA Gait & Station: UTA Patient leans: N/A  Psychiatric Specialty Exam: Review of Systems  Psychiatric/Behavioral: Positive for dysphoric mood and sleep disturbance. The patient is nervous/anxious.   All other systems reviewed and are negative.   There were no vitals taken for this visit.There is no height or weight on file to calculate BMI.  General Appearance: Casual  Eye Contact:  Fair  Speech:  Clear and Coherent  Volume:  Normal  Mood:  Anxious, Depressed and Dysphoric  Affect:  Tearful  Thought Process:  Goal Directed and Descriptions of Associations: Intact  Orientation:  Full (Time, Place, and Person)  Thought Content: Logical   Suicidal Thoughts:  No  Homicidal Thoughts:  No  Memory:  Immediate;   Fair Recent;   Fair Remote;   Fair  Judgement:  Fair  Insight:  Fair   Psychomotor Activity:  Normal  Concentration:  Concentration: Fair and Attention Span: Fair  Recall:  [employer] of Knowledge: Fair  Language: Fair  Akathisia:  No  Handed:  Right  AIMS (if indicated):UTA  Assets:  Communication Skills Desire for Improvement Housing Social Support Talents/Skills  ADL's:  Intact  Cognition: WNL  Sleep:  Poor   Screenings: GAD-7   Flowsheet Row Office Visit from 08/21/2020 in [facility] Office Visit from 08/03/2020 in [facility] Counselor from 05/18/2020 in [facility] Office Visit from 03/15/2020 in [facility] Office Visit from 02/02/2020 in [facility]  Total GAD-7 Score 16 21 21 21 21     PHQ2-9   Flowsheet Row Office Visit from 08/21/2020 in [facility] Video Visit from 08/07/2020 in [facility] Office Visit from 08/03/2020 in [facility] Office Visit from 06/13/2020 in [facility] Counselor from 05/18/2020 in [facility]  PHQ-2 Total Score 5 6 4 6 5   PHQ-9 Total Score 17 17 17 22 20     Flowsheet Row Counselor from 12/13/2020 in [facility]  C-SSRS RISK CATEGORY No Risk       Assessment and Plan: Virginia Walls is a 40 year old African-American female, lives in [place], has a history of depression, anxiety, bereavement was evaluated by telemedicine today.  Patient is biologically predisposed due to her trauma, death in her family, history of mental health problems.  Patient is noncompliant with medication management and currently returns reporting worsening mood symptoms and sleep problems.  She will benefit from the following plan.  Plan MDD-unstable Patient has been referred for intensive outpatient program by her therapist.  I have communicated with Ms. Christina Hussami -therapist who  reports patient has been approved for MH IOP program with [facility].  Patient advised to contact [name] venlafaxine 75 mg p.o. daily Restart Abilify 10 mg p.o. daily  Restart mirtazapine 15 mg p.o. nightly Continue CBT  GAD-unstable Klonopin 0.5 mg as needed during the day and 1 mg at bedtime as needed-patient has been noncompliant. Restart venlafaxine and mirtazapine Patient has been referred for intensive outpatient program.  Bereavement-unstable Patient will benefit from IOP. Provided grief counseling.  Alcohol abuse-rule out alcohol use disorder-unstable Provided counseling.  Will monitor patient closely.  Encouraged compliance on medications.  Encourage compliance with therapy.  Follow-up in clinic in 4 weeks after completing IOP.  I have spent atleast 25 minutes face to face with patient today which includes the time spent for preparing to see the patient ( e.g., review of test, records ), obtaining and to review and separately obtained history , ordering medications and test ,psychoeducation and supportive psychotherapy and care coordination,as well as documenting clinical information in electronic health record,interpreting and communication of test results  This note was generated in part or whole with voice recognition software. Voice recognition is usually quite accurate but there are transcription errors that can and very often do occur. I apologize for any typographical errors that were not detected and corrected.         Jomarie Longs, MD 01/17/2021, 1:15 PM

## 2021-01-16 NOTE — Progress Notes (Signed)
Virtual Visit via Video Note  I connected with Amie Critchley Houlton on 01/16/21 at 11:00 AM EDT by a video enabled telemedicine application and verified that I am speaking with the correct person using two identifiers.  Location: Patient: home Provider: [facility]   I discussed the limitations of evaluation and management by telemedicine and the availability of in person appointments. The patient expressed understanding and agreed to proceed.   I discussed the assessment and treatment plan with the patient. The patient was provided an opportunity to ask questions and all were answered. The patient agreed with the plan and demonstrated an understanding of the instructions.   The patient was advised to call back or seek an in-person evaluation if the symptoms worsen or if the condition fails to improve as anticipated.  I provided 45 minutes of non-face-to-face time during this encounter.   Greidys Deland R Verenice Westrich, LCSW    THERAPIST PROGRESS NOTE  Session Time: 11-11:45a  Participation Level: Active  Behavioral Response: NeatAlertDepressed and Dysphoric  Type of Therapy: Individual Therapy  Treatment Goals addressed: Coping  Interventions: Other: trauma-focused, grief counseling  Summary: VEANNA DOWER is a 40 y.o. female who presents with escalating symptoms related to depression, bereavement, and GAD diagnoses. Pt reports that overall mood has been fluctuating and that she is having difficulty managing stress/anxiety on most days. Pt reports poor quality and quantity of sleep.  Pt reports that she recently went on a cruise and forgot her medications, so she has been off medication for over 3+ weeks. Last appt with psychiatrist was 10/04/2020.   Discussed upcoming anniversary of son's death and overall psychological impact. Discussed family relationships, work-related stress, and pts ability to function overall. Pt states that she cries every day and is not able to think like she used to "I am  just different".  Discussed grief and how it impacts everything from emotions to cognitive functioning. Discussed grief process and how individuals tend to do well then have setbacks around triggers--specific dates, songs, etc. Pt and husband have both been discussing similarities and differences in the way that they grieve. Pt overall feels limited support due to others not understanding how she is feeling as a mother that has lost her child.   Made recommendations that pt do intensive outpatient work and/or intensive Hospice individual counseling.   Continued recommendations are as follows: self care behaviors, positive social engagements, focusing on overall work/home/life balance, and focusing on positive physical and emotional wellness.     Suicidal/Homicidal: No  Therapist Response: Beyza is reporting an escalation of symptoms, which is reflective of intermittent/fluctuating progress. Recommended pt reach out to psychiatrist about getting back on medication ASAP and referred to intensive outpatient.  Plan: Return again in 2.5 weeks.  Diagnosis: Axis I: Bereavement, Generalized Anxiety Disorder and Major Depression, Recurrent severe    Axis II: No diagnosis    Ernest Haber Mireille Lacombe, LCSW 01/16/2021

## 2021-01-17 ENCOUNTER — Telehealth (HOSPITAL_COMMUNITY): Payer: Self-pay | Admitting: Psychiatry

## 2021-01-17 ENCOUNTER — Ambulatory Visit: Payer: 59 | Admitting: Licensed Clinical Social Worker

## 2021-01-18 ENCOUNTER — Telehealth (HOSPITAL_COMMUNITY): Payer: Self-pay | Admitting: Psychiatry

## 2021-01-18 NOTE — Telephone Encounter (Signed)
D:  Patient was referred per her therapist (Christina Hussami, LCSW).  A:  Oriented pt today.  Pt will start MH-IOP on 01-22-21.  Encouraged pt to verify her insurance benefits.  R:  Pt receptive.

## 2021-01-22 ENCOUNTER — Other Ambulatory Visit (HOSPITAL_COMMUNITY): Payer: 59 | Attending: Psychiatry | Admitting: Psychiatry

## 2021-01-22 ENCOUNTER — Other Ambulatory Visit: Payer: Self-pay

## 2021-01-22 ENCOUNTER — Encounter (HOSPITAL_COMMUNITY): Payer: Self-pay | Admitting: Psychiatry

## 2021-01-22 DIAGNOSIS — F411 Generalized anxiety disorder: Secondary | ICD-10-CM | POA: Diagnosis not present

## 2021-01-22 DIAGNOSIS — F333 Major depressive disorder, recurrent, severe with psychotic symptoms: Secondary | ICD-10-CM | POA: Insufficient documentation

## 2021-01-22 DIAGNOSIS — F331 Major depressive disorder, recurrent, moderate: Secondary | ICD-10-CM

## 2021-01-22 DIAGNOSIS — Z634 Disappearance and death of family member: Secondary | ICD-10-CM

## 2021-01-22 NOTE — Progress Notes (Signed)
Virtual Visit via Video Note  I connected with Virginia Walls on @TODAY @ at  9:00 AM EDT by a video enabled telemedicine application and verified that I am speaking with the correct person using two identifiers.  Location: Patient: at home Provider: at office   I discussed the limitations of evaluation and management by telemedicine and the availability of in person appointments. The patient expressed understanding and agreed to proceed.  I discussed the assessment and treatment plan with the patient. The patient was provided an opportunity to ask questions and all were answered. The patient agreed with the plan and demonstrated an understanding of the instructions.   The patient was advised to call back or seek an in-person evaluation if the symptoms worsen or if the condition fails to improve as anticipated.  I provided 20 minutes of non-face-to-face time during this encounter.   , M.Ed,CNA   Patient ID: Virginia Walls, female   DOB: 1981-07-01, 40 y.o.   MRN: [id number] D:  This is a 40 yr old, married, employed, 24 American female who was referred per therapist (Christina Hussami, LCSW) and Dr. [place]; treatment for worsening depressive and anxiety symptoms. As per Dr. Elna Breslow initial note( 04-11-20) states:  Patient today reports that she has a history of depression since the past several years.  She had her first depressive episode after the birth of her second son who is currently 23 years old.  Patient reports however her depressive symptoms started getting worse since the past 2 months.  She reports this started after the loss of her 40 year old son who died of leukemia.  He was diagnosed with leukemia in December 18, 2020and passed away in 01/29/2020. Patient struggles with sadness, crying spells, lack of motivation, social withdrawal, anhedonia, lack of concentration, sleep problems, lack of appetite.  Patient does report that she is a January 23, 2020.  She worries about  everything to the extreme.  She reports even before her son passed away she worried about her children's safety. She reports she is often irritable and easily get annoyed.  She reports she is often nervous and fidgety and restless and this has been getting worse since the past several months.  Pt started virtual MH-IOP today.  Continues to c/o of same symptoms that are listed above.  Denies SI/HI or A/V hallucinations.  Reports that this Saturday was the anniversary of her son's death.  "My family spent time together that day.  It was a very difficult day; I didn't get out of bed until 12 noon." Cc: [facility] for more hx  A: Oriented pt to virtual MH-IOP.  Pt gave verbal consent for treatment, to release chart information to referred providers and to complete any forms if needed.  Pt also gave consent for attending group virtually d/t COVID-19 social distancing restrictions.  Encouraged support groups; especially grief/loss group.  Pt to be taken out of work beginning today (01-22-21).  F/U with Dr. 01-10-2002 and Elna Breslow Hussami, LCSW.  R:  Patient receptive.  Trula Ore, M.Ed, CNA

## 2021-01-22 NOTE — Progress Notes (Signed)
Virtual Visit via Video Note  I connected with Virginia Walls on 01/22/21 at  9:00 AM EDT by a video enabled telemedicine application and verified that I am speaking with the correct person using two identifiers.  At orientation to the IOP program, Case Manager discussed the limitations of evaluation and management by telemedicine and the availability of in person appointments. The patient expressed understanding and agreed to proceed with virtual visits throughout the duration of the program.   Location:  Patient: Patient Home Provider: Home Office   History of Present Illness: MDD and Bereavement   Observations/Objective: Check In: Case Manager checked in with all participants to review discharge dates, insurance authorizations, work-related documents and needs from the treatment team regarding medications. Client stated needs and engaged in discussion. Case Manager introduced 2 new Clients to the group, with group members starting the joining process.   Initial Therapeutic Activity: Counselor facilitated therapeutic processing with group members to assess mood and current functioning, prompting group members to share about application of skills, progress and challenges in treatment/personal lives. Topics covered: grief and loss, repairing relationships, faith related to recovery, overcoming anxiety and structuring day. Client engaged in discussions and shared application of skills. Client presents with severe depression and severe anxiety. Client denied any current SI/HI/psychosis.   Second Therapeutic Activity: Counselor engaged group in creation of ECOMaps to assess their support systems, relationship dynamics, energy given and taken and boundaries in relationships. Counselor prompted group members to share their final products, highlighting themes/areas of concern in the visual representation of their lives. Counselor challenged group members to choose 2-3 dynamics they would like to improve  over the next few months using communication and boundary setting skills. Client engaged in discussion and participated well in activity.     Check Out: Counselor prompted group members to choose a relaxation or self-care activity for the afternoon, as we covered heavy emotional topics in group. Counselor promoted self-compassion. Client endorsed safety plan to be followed to prevent safety issues.   Assessment and Plan: Clinician recommends that Client remain in IOP treatment to better manage mental health symptoms, stabilization and to address treatment plan goals. Clinician recommends adherence to crisis/safety plan, taking medications as prescribed, and following up with medical professionals if any issues arise.   Follow Up Instructions: Clinician will send Webex link for next session. The Client was advised to call back or seek an in-person evaluation if the symptoms worsen or if the condition fails to improve as anticipated.     I provided 180 minutes of non-face-to-face time during this encounter.     Hilbert Odor, LCSW

## 2021-01-23 ENCOUNTER — Encounter (HOSPITAL_COMMUNITY): Payer: Self-pay | Admitting: Psychiatry

## 2021-01-23 ENCOUNTER — Telehealth: Payer: Self-pay

## 2021-01-23 ENCOUNTER — Other Ambulatory Visit: Payer: Self-pay

## 2021-01-23 ENCOUNTER — Other Ambulatory Visit (HOSPITAL_COMMUNITY): Payer: 59 | Admitting: Psychiatry

## 2021-01-23 DIAGNOSIS — Z634 Disappearance and death of family member: Secondary | ICD-10-CM

## 2021-01-23 DIAGNOSIS — F411 Generalized anxiety disorder: Secondary | ICD-10-CM

## 2021-01-23 DIAGNOSIS — F333 Major depressive disorder, recurrent, severe with psychotic symptoms: Secondary | ICD-10-CM

## 2021-01-23 DIAGNOSIS — F331 Major depressive disorder, recurrent, moderate: Secondary | ICD-10-CM

## 2021-01-23 MED ORDER — MIRTAZAPINE 15 MG PO TABS: 7.5000 mg | ORAL_TABLET | Freq: Every day | ORAL | 0 refills | Status: DC

## 2021-01-23 MED ORDER — ARIPIPRAZOLE 10 MG PO TABS: 10.0000 mg | ORAL_TABLET | Freq: Every day | ORAL | 0 refills | Status: DC

## 2021-01-23 MED ORDER — VENLAFAXINE HCL ER 75 MG PO CP24: 75.0000 mg | ORAL_CAPSULE | Freq: Every day | ORAL | 1 refills | Status: DC

## 2021-01-23 NOTE — Telephone Encounter (Signed)
pt states she was using [facility] but her insurance now wants her to use [facility].  so can you send in a refill for all her medication to go to [facility] please.

## 2021-01-23 NOTE — Telephone Encounter (Signed)
I have sent all her medications to [facility] . 

## 2021-01-23 NOTE — Progress Notes (Signed)
Virtual Visit via Video Note  I connected with Virginia Walls on 01/23/21 at  9:00 AM EDT by a video enabled telemedicine application and verified that I am speaking with the correct person using two identifiers.  At orientation to the IOP program, Case Manager discussed the limitations of evaluation and management by telemedicine and the availability of in person appointments. The patient expressed understanding and agreed to proceed with virtual visits throughout the duration of the program.   Location:  Patient: Patient Home Provider: Home Office   History of Present Illness: MDD and Breavement  Observations/Objective: Check In: Case Manager checked in with all participants to review discharge dates, insurance authorizations, work-related documents and needs from the treatment team regarding medications. Client stated needs and engaged in discussion. Case Manager introduced 1 new Client to the group, with group members starting the joining process.   Initial Therapeutic Activity: Counselor facilitated therapeutic processing with group members to assess mood and current functioning, prompting group members to share about application of skills, progress and challenges in treatment/personal lives. Topics covered: panic attacks, coping skills, grief work, childhood traumas, and medication management. Client engaged in discussions and shared application of skills. Client presents with severe depression and moderate anxiety. Client denied any current SI/HI/psychosis.   Second Therapeutic Activity: Counselor prompted group members to compare and contrast their anxiety and depression. Counselor instructed group to write down symptoms, thoughts, feelings, behaviors, sensations, triggers and anything else connected with their anxiety and depression to see what was similar and different. Group members shared their results, discussing how they are impacted and themes in their reflections. Counselor  prompted group members to choose 2 or 3 symptoms to focus on improving or addressing throughout their group treatment process. Client engaged and participated well in the activity.    Check Out: Counselor prompted group members to choose a relaxation or self-care activity for the afternoon, as we covered heavy emotional topics in group. Counselor promoted self-compassion. Client endorsed safety plan to be followed to prevent safety issues.   Assessment and Plan: Clinician recommends that Client remain in IOP treatment to better manage mental health symptoms, stabilization and to address treatment plan goals. Clinician recommends adherence to crisis/safety plan, taking medications as prescribed, and following up with medical professionals if any issues arise.   Follow Up Instructions: Clinician will send Webex link for next session. The Client was advised to call back or seek an in-person evaluation if the symptoms worsen or if the condition fails to improve as anticipated.     I provided 180 minutes of non-face-to-face time during this encounter.     Hilbert Odor, LCSW

## 2021-01-24 ENCOUNTER — Other Ambulatory Visit: Payer: Self-pay

## 2021-01-24 ENCOUNTER — Other Ambulatory Visit (HOSPITAL_COMMUNITY): Payer: 59 | Admitting: Family

## 2021-01-24 ENCOUNTER — Telehealth: Payer: Self-pay

## 2021-01-24 ENCOUNTER — Encounter (HOSPITAL_COMMUNITY): Payer: Self-pay

## 2021-01-24 DIAGNOSIS — F411 Generalized anxiety disorder: Secondary | ICD-10-CM

## 2021-01-24 DIAGNOSIS — F333 Major depressive disorder, recurrent, severe with psychotic symptoms: Secondary | ICD-10-CM | POA: Diagnosis not present

## 2021-01-24 DIAGNOSIS — F331 Major depressive disorder, recurrent, moderate: Secondary | ICD-10-CM

## 2021-01-24 MED ORDER — VENLAFAXINE HCL ER 75 MG PO CP24: 75.0000 mg | ORAL_CAPSULE | Freq: Every day | ORAL | 0 refills | Status: DC

## 2021-01-24 NOTE — Progress Notes (Signed)
Virtual Visit via Video Note  I connected with Virginia Walls on 01/24/21 at  9:00 AM EDT by a video enabled telemedicine application and verified that I am speaking with the correct person using two identifiers.  At orientation to the IOP program, Case Manager discussed the limitations of evaluation and management by telemedicine and the availability of in person appointments. The patient expressed understanding and agreed to proceed with virtual visits throughout the duration of the program.   Location:  Patient: Patient Home Provider: Home Office   History of Present Illness:   Observations/Objective: Check In: Case Manager checked in with all participants to review discharge dates, insurance authorizations, work-related documents and needs from the treatment team regarding medications. Client stated needs and engaged in discussion. Case Manager introduced 1 new Client to the group, with group members starting the joining process.   Initial Therapeutic Activity: Counselor introduced our guest speaker, Peggye Fothergill, [facility] Pharmacist, who shared about psychiatric medications, side effects, treatment considerations and how to communicate with medical professionals. Group Members asked questions and shared medication concerns. Counselor prompted group members to reference a worksheet called, "Body Scan" to jot down questions and concerns about their physical health in preparation for their upcoming appointments with medical professionals. _ Counselor encouraged routine medical check-ups, preparing for appointments, following up with recommendations and seeking specialist if needed.   Second Therapeutic Activity: Counselor facilitated therapeutic processing with group members to assess mood and current functioning, prompting group members to share about application of skills, progress and challenges in treatment/personal lives. Topics covered: panic attacks, coping skills, grief work, childhood  traumas, and medication management. Client engaged in discussions and shared application of skills. Client presents with _ depression and _ anxiety. Client denied any current SI/HI/psychosis.   Check Out: Counselor closed program by allowing time to celebrate a graduating group member. Counselor shared reflections on progress and allow space for group members to share well wishes and appreciates to the graduating client. Counselor prompted graduating client to share takeaways, reflect on progress and final thoughts for the group. Counselor prompted group members to choose a relaxation or self-care activity for the afternoon, as we covered heavy emotional topics in group. Counselor promoted self-compassion. Client endorsed safety plan to be followed to prevent safety issues.   Assessment and Plan: Clinician recommends that Client remain in IOP treatment to better manage mental health symptoms, stabilization and to address treatment plan goals. Clinician recommends adherence to crisis/safety plan, taking medications as prescribed, and following up with medical professionals if any issues arise.   Follow Up Instructions: Clinician will send Webex link for next session. The Client was advised to call back or seek an in-person evaluation if the symptoms worsen or if the condition fails to improve as anticipated.     I provided 180 minutes of non-face-to-face time during this encounter.     Hilbert Odor, LCSW

## 2021-01-24 NOTE — Progress Notes (Signed)
Virtual Visit via Video Note  I connected with Virginia Walls on 01/24/21 at  9:00 AM EDT by a video enabled telemedicine application and verified that I am speaking with the correct person using two identifiers.  Location: Patient: home Provider: Office   I discussed the limitations of evaluation and management by telemedicine and the availability of in person appointments. The patient expressed understanding and agreed to proceed.    I discussed the assessment and treatment plan with the patient. The patient was provided an opportunity to ask questions and all were answered. The patient agreed with the plan and demonstrated an understanding of the instructions.   The patient was advised to call back or seek an in-person evaluation if the symptoms worsen or if the condition fails to improve as anticipated.  I provided 15 minutes of non-face-to-face time during this encounter.   Oneta Rack, NP    Psychiatric Initial Adult Assessment   Patient Identification: Virginia Walls MRN:  [id number] Date of Evaluation:  01/24/2021 Referral Source: Therapist  Chief Complaint:  Depression related to grief and loss Visit Diagnosis:    ICD-10-CM   1. MDD (major depressive disorder), recurrent, severe, with psychosis (HCC)  F33.3   2. GAD (generalized anxiety disorder)  F41.1     History of Present Illness:  Virginia Walls 40 year old African-American female presents with worsening depression due to the passing of her 40 year old.  Reports anniversary date of his passing is approaching.  She reports poor concentration, decreased appetite, anger and mood irritability.  Reports she is currently followed by therapist C. Hussami and a psychiatrist Eappen.  States she is prescribed Abilify, Topamax, Mirtazapine and Effexor.  She reports taking medications as directed.  Denies any medication side effects.  Denied history of substance abuse or illicit drug use.  Denied previous inpatient admissions.  She  denies suicidal or homicidal ideations.  Denies auditory or visual hallucinations.  Miniya reports she is married with 3 children.  Reports her husband is supportive.  States she is currently employed.  Denied work-related stressors.   Reported family history of mental illness.  Reported possible postpartum depression, stated she never followed up at that time.  States after the passing of her son she started seeking help.  Denied that she attended group for grief and loss.  Denied history of physical sexual abuse in the past.  Patient to start intensive outpatient programming for 02/07/2021  Associated Signs/Symptoms: Depression Symptoms:  depressed mood, feelings of worthlessness/guilt, difficulty concentrating, anxiety, decreased appetite, (Hypo) Manic Symptoms:  Distractibility, Irritable Mood, Anxiety Symptoms:  Excessive Worry, Psychotic Symptoms:  Hallucinations: None PTSD Symptoms: NA  Past Psychiatric History: Major depressive disorder  Previous Psychotropic Medications: Yes   Substance Abuse History in the last 12 months:  No.  Consequences of Substance Abuse: NA  Past Medical History:  Past Medical History:  Diagnosis Date  . Back pain   . Depression   . Migraine   . Palpitation   . Panic disorder   . RLS (restless legs syndrome)     Past Surgical History:  Procedure Laterality Date  . ABDOMINAL HYSTERECTOMY    . TUBAL LIGATION      Family Psychiatric History:   Family History:  Family History  Problem Relation Age of Onset  . Heart disease Mother   . Asthma Mother   . Hypothyroidism Mother   . Bipolar disorder Mother   . Hypothyroidism Sister   . Diabetes Sister   . Hyperlipidemia Sister   .  Asthma Son   . Heart disease Paternal Grandmother        Pacemaker  . Leukemia Paternal Grandmother   . Diabetes Paternal Grandmother   . Bipolar disorder Maternal Aunt   . Bipolar disorder Maternal Uncle     Social History:   Social History    Socioeconomic History  . Marital status: Single    Spouse name: Not on file  . Number of children: 3  . Years of education: Not on file  . Highest education level: Not on file  Occupational History  . Occupation: Energy manager   Tobacco Use  . Smoking status: Former Smoker    Packs/day: 0.25    Years: 4.00    Pack years: 1.00    Types: Cigarettes    Start date: 05/04/2011    Quit date: 07/21/2020    Years since quitting: 0.5  . Smokeless tobacco: Never Used  Vaping Use  . Vaping Use: Never used  Substance and Sexual Activity  . Alcohol use: Yes    Alcohol/week: 0.0 standard drinks    Comment: occasionally  . Drug use: No  . Sexual activity: Yes    Partners: Male  Other Topics Concern  . Not on file  Social History Narrative   Lives with same person / boyfriend for the past 18 years   She has three son. 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    Youngest sone died at age 55 from Leukemia    Social Determinants of Health   Financial Resource Strain: Not on file  Food Insecurity: Not on file  Transportation Needs: Not on file  Physical Activity: Not on file  Stress: Not on file  Social Connections: Not on file    Additional Social History:   Allergies:   Allergies  Allergen Reactions  . Corylus   . Hydrocodone     Metabolic Disorder Labs: Lab Results  Component Value Date   HGBA1C 5.2 04/14/2020   MPG 105 07/24/2017   Lab Results  Component Value Date   PROLACTIN 21.7 04/14/2020   Lab Results  Component Value Date   CHOL 208 (H) 04/14/2020   TRIG 90 04/14/2020   HDL 61 04/14/2020   CHOLHDL 3.4 04/14/2020   LDLCALC 131 (H) 04/14/2020   LDLCALC 125 (H) 07/24/2017   Lab Results  Component Value Date   TSH 1.090 04/14/2020    Therapeutic Level Labs: No results found for: LITHIUM No results found for: CBMZ No results found for: VALPROATE  Current Medications: Current  Outpatient Medications  Medication Sig Dispense Refill  . ARIPiprazole (ABILIFY) 10 MG tablet Take 1 tablet (10 mg total) by mouth daily. 90 tablet 0  . clonazePAM (KLONOPIN) 1 MG tablet Take 0.5-1 tablets (0.5-1 mg total) by mouth as directed. Start taking half tablet daily during the day as needed for anxiety attacks and one tablet at bedtime as needed for sleep and anxiety- please limit use 45 tablet 1  . mirtazapine (REMERON) 15 MG tablet Take 0.5-1 tablets (7.5-15 mg total) by mouth at bedtime. Sleep and mood 90 tablet 0  . Rimegepant Sulfate (NURTEC) 75 MG TBDP Take 1 tablet by mouth every other day. 16 tablet 2  . topiramate (TOPAMAX) 100 MG tablet Take 1 tablet (100 mg total) by mouth 2 (two) times daily. Max of 100  Mg twice daily 180 tablet 0  . venlafaxine XR (EFFEXOR XR) 75 MG 24  hr capsule Take 1 capsule (75 mg total) by mouth daily with breakfast. 30 capsule 1  . Vitamin D, Ergocalciferol, (DRISDOL) 1.25 MG (50000 UNIT) CAPS capsule TAKE 1 CAPSULE (50,000 UNITS TOTAL) BY MOUTH EVERY 7 (SEVEN) DAYS. (NOT COVERED) 12 capsule 0   No current facility-administered medications for this visit.    Musculoskeletal: Strength & Muscle Tone: within normal limits Gait & Station: normal Patient leans: N/A  Psychiatric Specialty Exam: Review of Systems  There were no vitals taken for this visit.There is no height or weight on file to calculate BMI.  General Appearance: Casual  Eye Contact:  Good  Speech:  Clear and Coherent  Volume:  Normal  Mood:  Anxious, Depressed and Irritable  Affect:  Depressed  Thought Process:  Coherent  Orientation:  Full (Time, Place, and Person)  Thought Content:  Logical  Suicidal Thoughts:  No  Homicidal Thoughts:  No  Memory:  Immediate;   Fair Recent;   Fair  Judgement:  Fair  Insight:  Present  Psychomotor Activity:  Normal  Concentration:  Concentration: Good  Recall:  Good  Fund of Knowledge:Good  Language: Good  Akathisia:  No  Handed:   Right  AIMS (if indicated):    Assets:  Communication Skills Resilience Social Support  ADL's:  Intact  Cognition: WNL  Sleep:  Fair   Screenings: GAD-7   Flowsheet Row Office Visit from 08/21/2020 in [facility] Office Visit from 08/03/2020 in [facility] Counselor from 05/18/2020 in [facility] Office Visit from 03/15/2020 in [facility] Office Visit from 02/02/2020 in [facility]  Total GAD-7 Score 16 21 21 21 21     PHQ2-9   Flowsheet Row Counselor from [facility]/22/2021 [facility] Office Visit from 08/21/2020 in [facility] Video Visit from 08/07/2020 in [facility] Office Visit from 08/03/2020 in [facility] Office Visit from 06/13/2020 in [facility]  PHQ-2 Total Score 6 5 6 4 6   PHQ-9 Total Score 20 17 17 17 22     Flowsheet Row Counselor from 01/22/2021 in [facility] Counselor from 12/13/2020 in [facility]  C-SSRS RISK CATEGORY No Risk No Risk      Assessment and Plan:  Patient to start intensive outpatient program (IOP) Continue medications as indicated   Treatment plan was reviewed and agreed upon by NP T. 03/24/2021 and patient  Machell Wirthlin need for group services   [facility], NP 4/6/20222:11 PM

## 2021-01-24 NOTE — Telephone Encounter (Signed)
I have sent venlafaxine 90-day supply.

## 2021-01-24 NOTE — Telephone Encounter (Signed)
received fax that patient needs a 90 day supply of the venlafaxine per insurance

## 2021-01-25 ENCOUNTER — Other Ambulatory Visit (HOSPITAL_COMMUNITY): Payer: 59 | Admitting: Psychiatry

## 2021-01-25 ENCOUNTER — Other Ambulatory Visit: Payer: Self-pay

## 2021-01-25 ENCOUNTER — Encounter (HOSPITAL_COMMUNITY): Payer: Self-pay

## 2021-01-25 DIAGNOSIS — F331 Major depressive disorder, recurrent, moderate: Secondary | ICD-10-CM

## 2021-01-25 DIAGNOSIS — F333 Major depressive disorder, recurrent, severe with psychotic symptoms: Secondary | ICD-10-CM | POA: Diagnosis not present

## 2021-01-25 NOTE — Progress Notes (Signed)
Virtual Visit via Video Note  I connected with Virginia Walls on 01/25/21 at  9:00 AM EDT by a video enabled telemedicine application and verified that I am speaking with the correct person using two identifiers.  At orientation to the IOP program, Case Manager discussed the limitations of evaluation and management by telemedicine and the availability of in person appointments. The patient expressed understanding and agreed to proceed with virtual visits throughout the duration of the program.   Location:  Patient: Patient Home Provider: Home Office   History of Present Illness: MDD  Observations/Objective: Check In: Case Manager checked in with all participants to review discharge dates, insurance authorizations, work-related documents and needs from the treatment team regarding medications. Client stated needs and engaged in discussion.   Initial Therapeutic Activity: Counselor facilitated therapeutic processing with group members to assess mood and current functioning, prompting group members to share about application of skills, progress and challenges in treatment/personal lives. Topics covered: work-stress solutions, sleep hygiene, social connections and following up on homework to address medical issues. Client presents with severe depression and moderate anxiety. Client denied any current SI/HI/psychosis.   Second Therapeutic Activity: Counselor engaged group in discussion on self-compassion discussion meaning and current practices. Counselor prompted group members to compete a self-compassion assessment. Group members shared they scores and indications. Counselor provided group with tools, resources and practices to improve self-compassion. Counselor facilitated a Radio producer, then group shared their preferences and experiences. Client engaged well in discussion and activities.    Check Out: Counselor closed program by allowing time to celebrate a graduating group member.  Counselor shared reflections on progress and allow space for group members to share well wishes and appreciates to the graduating client. Counselor prompted graduating client to share takeaways, reflect on progress and final thoughts for the group. Counselor prompted group members to choose a relaxation or self-care activity for the afternoon, as we covered heavy emotional topics in group. Counselor promoted self-compassion. Client endorsed safety plan to be followed to prevent safety issues.   Assessment and Plan: Clinician recommends that Client remain in IOP treatment to better manage mental health symptoms, stabilization and to address treatment plan goals. Clinician recommends adherence to crisis/safety plan, taking medications as prescribed, and following up with medical professionals if any issues arise.   Follow Up Instructions: Clinician will send Webex link for next session. The Client was advised to call back or seek an in-person evaluation if the symptoms worsen or if the condition fails to improve as anticipated.     I provided 180 minutes of non-face-to-face time during this encounter.     Hilbert Odor, LCSW

## 2021-01-26 ENCOUNTER — Other Ambulatory Visit: Payer: Self-pay

## 2021-01-26 ENCOUNTER — Other Ambulatory Visit (HOSPITAL_COMMUNITY): Payer: 59 | Admitting: Psychiatry

## 2021-01-26 ENCOUNTER — Encounter (HOSPITAL_COMMUNITY): Payer: Self-pay

## 2021-01-26 DIAGNOSIS — F333 Major depressive disorder, recurrent, severe with psychotic symptoms: Secondary | ICD-10-CM | POA: Diagnosis not present

## 2021-01-26 DIAGNOSIS — F331 Major depressive disorder, recurrent, moderate: Secondary | ICD-10-CM

## 2021-01-26 DIAGNOSIS — Z634 Disappearance and death of family member: Secondary | ICD-10-CM

## 2021-01-26 NOTE — Progress Notes (Signed)
Virtual Visit via Video Note  I connected with Virginia Walls on 01/26/21 at  9:00 AM EDT by a video enabled telemedicine application and verified that I am speaking with the correct person using two identifiers.  At orientation to the IOP program, Case Manager discussed the limitations of evaluation and management by telemedicine and the availability of in person appointments. The patient expressed understanding and agreed to proceed with virtual visits throughout the duration of the program.   Location:  Patient: Patient Home Provider: Home Office   History of Present Illness: MDD and Bereavement   Observations/Objective: Check In: Case Manager checked in with all participants to review discharge dates, insurance authorizations, work-related documents and needs from the treatment team regarding medications. Client stated needs and engaged in discussion.   Initial Therapeutic Activity: Counselor facilitated therapeutic processing with group members to assess mood and current functioning, prompting group members to share about application of skills, progress and challenges in treatment/personal lives. Topics covered: parenting challenges, communicating needs, work-related stress, sleep hygiene, and generational patterns. Client engaged in discussions and shared application of skills. Client presents with moderate depression and moderate anxiety. Client denied any current SI/HI/psychosis.   Second Therapeutic Activity: Counselor engaged group in discussion about self-care. Counselor shared 2 handouts with self-care wheels to explore where Client's can improve their self-care practices. Client identified specific self-care practices and skills she would like to implement in her daily and weekly life. Client stated that she rarely makes time for self and would like to structure more into her schedule, as she sees the importance and benefits.    Check Out: Counselor prompted group members to choose  a relaxation or self-care activity for the weekend. Client plans to go on an outing with a close friend. Counselor promoted self-compassion. Client endorsed safety plan to be followed to prevent safety issues.   Assessment and Plan: Clinician recommends that Client remain in IOP treatment to better manage mental health symptoms, stabilization and to address treatment plan goals. Clinician recommends adherence to crisis/safety plan, taking medications as prescribed, and following up with medical professionals if any issues arise.   Follow Up Instructions: Clinician will send Webex link for next session. The Client was advised to call back or seek an in-person evaluation if the symptoms worsen or if the condition fails to improve as anticipated.     I provided 180 minutes of non-face-to-face time during this encounter.     Hilbert Odor, LCSW

## 2021-01-29 ENCOUNTER — Other Ambulatory Visit: Payer: Self-pay

## 2021-01-29 ENCOUNTER — Other Ambulatory Visit (HOSPITAL_COMMUNITY): Payer: 59 | Admitting: Psychiatry

## 2021-01-29 ENCOUNTER — Encounter (HOSPITAL_COMMUNITY): Payer: Self-pay

## 2021-01-29 DIAGNOSIS — F333 Major depressive disorder, recurrent, severe with psychotic symptoms: Secondary | ICD-10-CM | POA: Diagnosis not present

## 2021-01-29 DIAGNOSIS — F331 Major depressive disorder, recurrent, moderate: Secondary | ICD-10-CM

## 2021-01-29 NOTE — Progress Notes (Signed)
Virtual Visit via Video Note  I connected with Virginia Walls on 01/29/21 at  9:00 AM EDT by a video enabled telemedicine application and verified that I am speaking with the correct person using two identifiers.  At orientation to the IOP program, Case Manager discussed the limitations of evaluation and management by telemedicine and the availability of in person appointments. The patient expressed understanding and agreed to proceed with virtual visits throughout the duration of the program.   Location:  Patient: Patient Home Provider: Home Office   History of Present Illness: MDD  Observations/Objective: Check In: Case Manager checked in with all participants to review discharge dates, insurance authorizations, work-related documents and needs from the treatment team regarding medications. Client stated needs and engaged in discussion.   Initial Therapeutic Activity: Counselor facilitated therapeutic processing with group members to assess mood and current functioning, prompting group members to share about application of skills, progress and challenges in treatment/personal lives. Topics covered: grief and loss, financial stressors, medication side effects, communication with supports and family stressors. Client presents with moderate depression and moderate anxiety. Client denied any current SI/HI/psychosis.   Second Therapeutic Activity: Counselor provided group with an exhaustive list of community, state and national mental health, substance abuse and basic need resources. Counselor prompted group members to save numbers in their phones and to bookmark links for future use.   Check Out: Counselor prompted group members to choose a relaxation or self-care activity for the weekend. Counselor promoted self-compassion. Client endorsed safety plan to be followed to prevent safety issues.   Assessment and Plan: Clinician recommends that Client remain in IOP treatment to better manage  mental health symptoms, stabilization and to address treatment plan goals. Clinician recommends adherence to crisis/safety plan, taking medications as prescribed, and following up with medical professionals if any issues arise.   Follow Up Instructions: Clinician will send Webex link for next session. The Client was advised to call back or seek an in-person evaluation if the symptoms worsen or if the condition fails to improve as anticipated.     I provided 180 minutes of non-face-to-face time during this encounter.     Hilbert Odor, LCSW

## 2021-01-30 ENCOUNTER — Other Ambulatory Visit: Payer: Self-pay

## 2021-01-30 ENCOUNTER — Other Ambulatory Visit (HOSPITAL_COMMUNITY): Payer: 59 | Admitting: Psychiatry

## 2021-01-30 DIAGNOSIS — F331 Major depressive disorder, recurrent, moderate: Secondary | ICD-10-CM

## 2021-01-30 DIAGNOSIS — F333 Major depressive disorder, recurrent, severe with psychotic symptoms: Secondary | ICD-10-CM | POA: Diagnosis not present

## 2021-01-30 DIAGNOSIS — Z634 Disappearance and death of family member: Secondary | ICD-10-CM

## 2021-01-31 ENCOUNTER — Encounter (HOSPITAL_COMMUNITY): Payer: Self-pay

## 2021-01-31 ENCOUNTER — Other Ambulatory Visit (HOSPITAL_COMMUNITY): Payer: 59 | Admitting: Psychiatry

## 2021-01-31 ENCOUNTER — Other Ambulatory Visit: Payer: Self-pay

## 2021-01-31 DIAGNOSIS — Z634 Disappearance and death of family member: Secondary | ICD-10-CM

## 2021-01-31 DIAGNOSIS — F333 Major depressive disorder, recurrent, severe with psychotic symptoms: Secondary | ICD-10-CM | POA: Diagnosis not present

## 2021-01-31 NOTE — Progress Notes (Signed)
Virtual Visit via Video Note  I connected with Virginia Walls on 01/31/21 at  9:00 AM EDT by a video enabled telemedicine application and verified that I am speaking with the correct person using two identifiers.  At orientation to the IOP program, Case Manager discussed the limitations of evaluation and management by telemedicine and the availability of in person appointments. The patient expressed understanding and agreed to proceed with virtual visits throughout the duration of the program.   Location:  Patient: Patient Home Provider: Home Office   History of Present Illness: MDD and Bereavement  Observations/Objective: Check In: Case Manager checked in with all participants to review discharge dates, insurance authorizations, work-related documents and needs from the treatment team regarding medications. Client stated needs and engaged in discussion. Case Manager introduced new client to the group, as group members welcomed and started joining process.  Initial Therapeutic Activity: Counselor facilitated therapeutic processing with group members to assess mood and current functioning, prompting group members to share about application of skills, progress and challenges in treatment/personal lives. Topics covered: how to organize environment, medication side effects, grief reminders/work, memory loss and challenges, and managing mental health as a partner. Client denied any current SI/HI/psychosis.   Second Therapeutic Activity: Counselor provided psychoeducation on goal writing and creation. Counselor used the S.M.A.R.T. Goals format, sharing a video and article with the group. Counselor allowed time for group members to generate their own S.M.A.R.T. Goals and ask questions about the process. Counselor prompted each group member to share goals aloud and received feedback from the group. Goals were focused around personal, health and relational aspirations. Client engaged well in the activity.     Check Out: Counselor prompted group members to choose a productivity activity or self-care practice to engage in today. Client endorsed safety plan to be followed to prevent safety issues.   Assessment and Plan: Clinician recommends that Client remain in IOP treatment to better manage mental health symptoms, stabilization and to address treatment plan goals. Clinician recommends adherence to crisis/safety plan, taking medications as prescribed, and following up with medical professionals if any issues arise.   Follow Up Instructions: Clinician will send Webex link for next session. The Client was advised to call back or seek an in-person evaluation if the symptoms worsen or if the condition fails to improve as anticipated.     I provided 180 minutes of non-face-to-face time during this encounter.     Hilbert Odor, LCSW

## 2021-01-31 NOTE — Progress Notes (Signed)
Communication fax sent  Weyman Pedro, MSW, LCSW Outpatient Therapist/Triage Specialist

## 2021-02-01 ENCOUNTER — Encounter (HOSPITAL_COMMUNITY): Payer: Self-pay | Admitting: Psychiatry

## 2021-02-01 ENCOUNTER — Ambulatory Visit: Payer: 59 | Admitting: Licensed Clinical Social Worker

## 2021-02-01 NOTE — Progress Notes (Signed)
Virtual Visit via Video Note  I connected with Virginia Walls on 02/01/21 at  9:00 AM EDT by a video enabled telemedicine application and verified that I am speaking with the correct person using two identifiers.  At orientation to the IOP program, Case Manager discussed the limitations of evaluation and management by telemedicine and the availability of in person appointments. The patient expressed understanding and agreed to proceed with virtual visits throughout the duration of the program.   Location:  Patient: Patient Home Provider: Home Office   History of Present Illness: MDD  Observations/Objective: Check In: Case Manager checked in with all participants to review discharge dates, insurance authorizations, work-related documents and needs from the treatment team regarding medications. Client stated needs and engaged in discussion.   Initial Therapeutic Activity: Counselor facilitated therapeutic processing with group members to assess mood and current functioning, prompting group members to share about application of skills, progress and challenges in treatment/personal lives. Topics covered: work related stress, impact of racism on MH, financial health, view on MH labels/diagnosis and goal setting. Client denied any current SI/HI/psychosis.   Second Therapeutic Activity: Counselor provided psychoeducation on setting intentions and creating a Life Purpose Statement. Counselor shared 2 articles and several examples for context and application. Counselor allowed time for group members to generate their own statements and intentions and ask questions about the process. Counselor prompted each group member to share what they developed aloud and received feedback from the group. Goals were focused around personal, financial, health and relational aspirations. Client engaged well in the activity.    Check Out: Counselor prompted group members to choose a productivity activity or self-care  practice to engage in today. Client plans to complete a household chore. Client endorsed safety plan to be followed to prevent safety issues.   Assessment and Plan: Clinician recommends that Client remain in IOP treatment to better manage mental health symptoms, stabilization and to address treatment plan goals. Clinician recommends adherence to crisis/safety plan, taking medications as prescribed, and following up with medical professionals if any issues arise.   Follow Up Instructions: Clinician will send Webex link for next session. The Client was advised to call back or seek an in-person evaluation if the symptoms worsen or if the condition fails to improve as anticipated.     I provided 180 minutes of non-face-to-face time during this encounter.     Hilbert Odor, LCSW

## 2021-02-02 ENCOUNTER — Encounter (HOSPITAL_COMMUNITY): Payer: Self-pay

## 2021-02-02 ENCOUNTER — Other Ambulatory Visit (HOSPITAL_COMMUNITY): Payer: 59 | Admitting: Psychiatry

## 2021-02-02 ENCOUNTER — Other Ambulatory Visit: Payer: Self-pay

## 2021-02-02 DIAGNOSIS — F333 Major depressive disorder, recurrent, severe with psychotic symptoms: Secondary | ICD-10-CM | POA: Diagnosis not present

## 2021-02-02 DIAGNOSIS — F331 Major depressive disorder, recurrent, moderate: Secondary | ICD-10-CM

## 2021-02-02 NOTE — Progress Notes (Signed)
Virtual Visit via Video Note  I connected with Virginia Walls on 02/02/21 at  9:00 AM EDT by a video enabled telemedicine application and verified that I am speaking with the correct person using two identifiers.  At orientation to the IOP program, Case Manager discussed the limitations of evaluation and management by telemedicine and the availability of in person appointments. The patient expressed understanding and agreed to proceed with virtual visits throughout the duration of the program.   Location:  Patient: Patient Home Provider: Home Office   History of Present Illness: MDD  Observations/Objective: Check In: Case Manager checked in with all participants to review discharge dates, insurance authorizations, work-related documents and needs from the treatment team regarding medications. Client stated needs and engaged in discussion. Case Manager reminded of contact and crisis numbers, and plan for program coverage next week. Case Manager introduced a new Client to the group, with group members welcoming and starting the joining process.   Initial Therapeutic Activity: Counselor facilitated a check-in with group members to assess mood and current functioning. Counselor facilitated group discussion on following topics, sharing coping skills and strategies to address: relational challenges, boundary setting, assertive communication, post-partum depression, social anxiety, familial and generational patterns. Client presents with moderate depression and moderate anxiety. Client engaged in discussion and provided feedback for other group members. Client denied any current SI/HI/psychosis.   Second Therapeutic Activity: Counselor prompted group members to share needs and recommendations for topics to cover in upcoming sessions to best address current mental health challenges. Group Members each shared their ideas, including time management, discharge planning, coping skills, managing work stress,  vulnerability, authenticity, and grief and loss. Client engaged well in discussion.    Check Out: Counselor prompted group members to choose a productivity activity or self-care practice to engage in over the weekend. Client plans to attend 2 family functions and will use her exit strategies if needed. Client endorsed safety plan to be followed to prevent safety issues.   Assessment and Plan: Clinician recommends that Client remain in IOP treatment to better manage mental health symptoms, stabilization and to address treatment plan goals. Clinician recommends adherence to crisis/safety plan, taking medications as prescribed, and following up with medical professionals if any issues arise.   Follow Up Instructions: Clinician will send Webex link for next session. The Client was advised to call back or seek an in-person evaluation if the symptoms worsen or if the condition fails to improve as anticipated.     I provided 180 minutes of non-face-to-face time during this encounter.     Hilbert Odor, LCSW

## 2021-02-05 ENCOUNTER — Telehealth (HOSPITAL_COMMUNITY): Payer: Self-pay | Admitting: Licensed Clinical Social Worker

## 2021-02-06 ENCOUNTER — Other Ambulatory Visit (HOSPITAL_COMMUNITY): Payer: 59 | Admitting: Licensed Clinical Social Worker

## 2021-02-06 ENCOUNTER — Other Ambulatory Visit: Payer: Self-pay

## 2021-02-06 DIAGNOSIS — F333 Major depressive disorder, recurrent, severe with psychotic symptoms: Secondary | ICD-10-CM | POA: Diagnosis not present

## 2021-02-06 DIAGNOSIS — F331 Major depressive disorder, recurrent, moderate: Secondary | ICD-10-CM

## 2021-02-07 ENCOUNTER — Other Ambulatory Visit: Payer: Self-pay

## 2021-02-07 ENCOUNTER — Other Ambulatory Visit (HOSPITAL_COMMUNITY): Payer: 59 | Admitting: Licensed Clinical Social Worker

## 2021-02-07 ENCOUNTER — Telehealth: Payer: Self-pay | Admitting: Psychiatry

## 2021-02-07 DIAGNOSIS — F331 Major depressive disorder, recurrent, moderate: Secondary | ICD-10-CM

## 2021-02-07 DIAGNOSIS — F333 Major depressive disorder, recurrent, severe with psychotic symptoms: Secondary | ICD-10-CM | POA: Diagnosis not present

## 2021-02-07 NOTE — Telephone Encounter (Signed)
I have completed FMLA form for patient- dated 01/16/2021 - 02/15/2021 for patient to participate in the IOP program.  We will have Shanda Bumps CMA fax it to [employer] group.

## 2021-02-08 ENCOUNTER — Ambulatory Visit: Payer: 59 | Admitting: Licensed Clinical Social Worker

## 2021-02-08 ENCOUNTER — Other Ambulatory Visit: Payer: Self-pay

## 2021-02-08 ENCOUNTER — Other Ambulatory Visit (HOSPITAL_COMMUNITY): Payer: 59 | Admitting: Licensed Clinical Social Worker

## 2021-02-08 DIAGNOSIS — F331 Major depressive disorder, recurrent, moderate: Secondary | ICD-10-CM

## 2021-02-08 DIAGNOSIS — F333 Major depressive disorder, recurrent, severe with psychotic symptoms: Secondary | ICD-10-CM | POA: Diagnosis not present

## 2021-02-09 ENCOUNTER — Other Ambulatory Visit: Payer: Self-pay

## 2021-02-09 ENCOUNTER — Other Ambulatory Visit (HOSPITAL_COMMUNITY): Payer: 59 | Admitting: Licensed Clinical Social Worker

## 2021-02-09 DIAGNOSIS — F333 Major depressive disorder, recurrent, severe with psychotic symptoms: Secondary | ICD-10-CM | POA: Diagnosis not present

## 2021-02-09 DIAGNOSIS — F331 Major depressive disorder, recurrent, moderate: Secondary | ICD-10-CM

## 2021-02-12 ENCOUNTER — Encounter (HOSPITAL_COMMUNITY): Payer: Self-pay | Admitting: Psychiatry

## 2021-02-12 ENCOUNTER — Other Ambulatory Visit: Payer: Self-pay

## 2021-02-12 ENCOUNTER — Other Ambulatory Visit (HOSPITAL_COMMUNITY): Payer: 59 | Admitting: Psychiatry

## 2021-02-12 DIAGNOSIS — F331 Major depressive disorder, recurrent, moderate: Secondary | ICD-10-CM

## 2021-02-12 DIAGNOSIS — F333 Major depressive disorder, recurrent, severe with psychotic symptoms: Secondary | ICD-10-CM | POA: Diagnosis not present

## 2021-02-12 DIAGNOSIS — Z634 Disappearance and death of family member: Secondary | ICD-10-CM

## 2021-02-12 NOTE — Progress Notes (Signed)
Virtual Visit via Video Note  I connected with Virginia Walls on 02/12/21 at  9:00 AM EDT by a video enabled telemedicine application and verified that I am speaking with the correct person using two identifiers.  At orientation to the IOP program, Case Manager discussed the limitations of evaluation and management by telemedicine and the availability of in person appointments. The patient expressed understanding and agreed to proceed with virtual visits throughout the duration of the program.   Location:  Patient: Patient Home Provider: Home Office   History of Present Illness: MDD  Observations/Objective: Check In: Case Manager checked in with all participants to review discharge dates, insurance authorizations, work-related documents and needs from the treatment team regarding medications. Client stated needs and engaged in discussion. Case Manager reminded of contact and crisis numbers, and plan for program coverage next week. Case Manager introduced a new Client to the group, with group members welcoming and starting the joining process.   Initial Therapeutic Activity: Counselor facilitated a check-in with group members to assess mood and current functioning. Counselor facilitated group discussion on following topics, sharing coping skills and strategies to address: discharge preparedness, grief and loss, overspending, and engaging in hobbies/interests. Client engaged in discussion and provided feedback for other group members. Client denied any current SI/HI/psychosis.   Second Therapeutic Activity: Counselor provided psychoeducation on the 5 Love Languages and the importance of how we receive and give love to others. Counselor shared the link for group members to take the love language quiz. Group members took the assessment and shared their results. Client identified Acts of Service as their love language. Counselor discussed application of aspects of the 5 Love languages and encouraged  group members to complete assessments, utilize resources with partners. Client engaged well in discussion.    Check Out: Counselor closed program by allowing time to celebrate a graduating group member. Counselor shared reflections on progress and allow space for group members to share well wishes and appreciates to the graduating client. Counselor prompted graduating client to share takeaways, reflect on progress and final thoughts for the group. Client endorsed safety plan to be followed to prevent safety issues.   Assessment and Plan: Clinician recommends that Client remain in IOP treatment to better manage mental health symptoms, stabilization and to address treatment plan goals. Clinician recommends adherence to crisis/safety plan, taking medications as prescribed, and following up with medical professionals if any issues arise.   Follow Up Instructions: Clinician will send Webex link for next session. The Client was advised to call back or seek an in-person evaluation if the symptoms worsen or if the condition fails to improve as anticipated.     I provided 180 minutes of non-face-to-face time during this encounter.     Hilbert Odor, LCSW

## 2021-02-13 ENCOUNTER — Other Ambulatory Visit (HOSPITAL_COMMUNITY): Payer: 59 | Admitting: Psychiatry

## 2021-02-13 ENCOUNTER — Encounter (HOSPITAL_COMMUNITY): Payer: Self-pay

## 2021-02-13 ENCOUNTER — Other Ambulatory Visit: Payer: Self-pay

## 2021-02-13 DIAGNOSIS — Z634 Disappearance and death of family member: Secondary | ICD-10-CM

## 2021-02-13 DIAGNOSIS — F333 Major depressive disorder, recurrent, severe with psychotic symptoms: Secondary | ICD-10-CM | POA: Diagnosis not present

## 2021-02-13 DIAGNOSIS — F331 Major depressive disorder, recurrent, moderate: Secondary | ICD-10-CM

## 2021-02-13 NOTE — Progress Notes (Signed)
Virtual Visit via Video Note  I connected with Virginia Walls on 02/13/21 at  9:00 AM EDT by a video enabled telemedicine application and verified that I am speaking with the correct person using two identifiers.  At orientation to the IOP program, Case Manager discussed the limitations of evaluation and management by telemedicine and the availability of in person appointments. The patient expressed understanding and agreed to proceed with virtual visits throughout the duration of the program.   Location:  Patient: Patient Home Provider: Home Office   History of Present Illness: MDD and Bereavement  Observations/Objective: Check In: Case Manager checked in with all participants to review discharge dates, insurance authorizations, work-related documents and needs from the treatment team regarding medications. Client stated needs and engaged in discussion. Case Manager reminded of contact and crisis numbers, and plan for program coverage next week. Case Manager introduced a new Client to the group, with group members welcoming and starting the joining process.   Initial Therapeutic Activity: Counselor facilitated a check-in with group members to assess mood and current functioning. Counselor facilitated group discussion on following topics, sharing coping skills and strategies to address: sleep hygiene, medication side effects, medication management/administration, future planning, health and nutrition. Client engaged in discussion and provided feedback for other group members. Client denied any current SI/HI/psychosis.   Second Therapeutic Activity: Counselor provided psychoeducation on time management. Counselor provided 2 videos and 2 articles highlighting tips, philosophies and strategies on structuring your day and maximizing time and energy. Counselor prompted group members to create a weekly plan/structure and identify areas where this has been challenging in the past. Client noted that she  will become more structured upon return to work/focus on sleep time and engaged in discussion.    Check Out: Counselor prompted group members to share what self-care practice or productivity activity they will engage in today. Client plans to complete household chores. Client endorsed safety plan to be followed to prevent safety issues.   Assessment and Plan: Clinician recommends that Client remain in IOP treatment to better manage mental health symptoms, stabilization and to address treatment plan goals. Clinician recommends adherence to crisis/safety plan, taking medications as prescribed, and following up with medical professionals if any issues arise.   Follow Up Instructions: Clinician will send Webex link for next session. The Client was advised to call back or seek an in-person evaluation if the symptoms worsen or if the condition fails to improve as anticipated.     I provided 180 minutes of non-face-to-face time during this encounter.     Hilbert Odor, LCSW

## 2021-02-14 ENCOUNTER — Other Ambulatory Visit (HOSPITAL_COMMUNITY): Payer: 59 | Admitting: Psychiatry

## 2021-02-14 ENCOUNTER — Other Ambulatory Visit: Payer: Self-pay

## 2021-02-14 ENCOUNTER — Encounter (HOSPITAL_COMMUNITY): Payer: Self-pay | Admitting: Family

## 2021-02-14 DIAGNOSIS — F411 Generalized anxiety disorder: Secondary | ICD-10-CM

## 2021-02-14 DIAGNOSIS — F333 Major depressive disorder, recurrent, severe with psychotic symptoms: Secondary | ICD-10-CM

## 2021-02-14 NOTE — Progress Notes (Signed)
Virtual Visit via Telephone Note  I connected with Zoriana D Mathia on @TODAY @ at  9:00 AM EDT by telephone and verified that I am speaking with the correct person using two identifiers.  Location: Patient: at home Provider: at office  I discussed the limitations, risks, security and privacy concerns of performing an evaluation and management service by telephone and the availability of in person appointments. I also discussed with the patient that there may be a patient responsible charge related to this service. The patient expressed understanding and agreed to proceed. I discussed the assessment and treatment plan with the patient. The patient was provided an opportunity to ask questions and all were answered. The patient agreed with the plan and demonstrated an understanding of the instructions.   The patient was advised to call back or seek an in-person evaluation if the symptoms worsen or if the condition fails to improve as anticipated.  I provided 20 minutes of non-face-to-face time during this encounter.   , M.Ed,CNA   Patient ID: Virginia Walls, female   DOB: August 07, 1981, 40 y.o.   MRN: [id number] D: D:  This is a 40 yr old, married, employed, 24 American female who was referred per therapist (Christina Hussami, LCSW) and Dr. [place]; treatment for worsening depressive and anxiety symptoms. As per Dr. Elna Breslow initial note( 04-11-20) states:  Patient today reports that she has a history of depression since the past several years. She had her first depressive episode after the birth of her second son who is currently 76 years old. Patient reports however her depressive symptoms started getting worse since the past 2 months. She reports this started after the loss of her 24 year old son who died of leukemia. He was diagnosed with leukemia in 2020/12/22and passed away in Feb 02, 2020. Patient struggles with sadness, crying spells, lack of motivation, social withdrawal,  anhedonia, lack of concentration, sleep problems, lack of appetite. Patient does report that she is a January 23, 2020. She worries about everything to the extreme. She reports even before her son passed away she worried about her children's safety. She reports she is often irritable and easily get annoyed. She reports she is often nervous and fidgety and restless and this has been getting worse since the past several months.  Pt started virtual MH-IOP today.  Continues to c/o of same symptoms that are listed above.  Denies SI/HI or A/V hallucinations.  Reports that this Saturday was the anniversary of her son's death.  "My family spent time together that day.  It was a very difficult day; I didn't get out of bed until 12 noon." Cc: [facility] for more hx  Pt completed all 18 MH-IOP days.  Overall mood is stabilizing; as expected still grieving the loss of her deceased son.  Pt is getting out of the home and being active a little more.  Pt states she is anxious about returning to work tomorrow.  On a scale of 1-10 (10 being the worst); pt rates her depression at a 6 and anxiety at a 7.  Denies SI/HI or A/V hallucinations. A:  D/C today.  F/U with Dr. Wednesday on 02-16-21 @ 8:30 a.m (in person) and Christina Hussami, LCSW on 02-19-21 @ 8 a.m. (virtual).  Encouraged support groups.  Strongly recommended a grief/loss group.  RTW tomorrow without any restrictions.  R:  Patient receptive.  03-30-1979, M.Ed,CNA

## 2021-02-14 NOTE — Progress Notes (Signed)
Virtual Visit via Video Note  I connected with Virginia Walls on 02/14/21 at  9:00 AM EDT by a video enabled telemedicine application and verified that I am speaking with the correct person using two identifiers.  Location: Patient: Home Provider: Office   I discussed the limitations of evaluation and management by telemedicine and the availability of in person appointments. The patient expressed understanding and agreed to proceed.   I discussed the assessment and treatment plan with the patient. The patient was provided an opportunity to ask questions and all were answered. The patient agreed with the plan and demonstrated an understanding of the instructions.   The patient was advised to call back or seek an in-person evaluation if the symptoms worsen or if the condition fails to improve as anticipated.  I provided 15 minutes of non-face-to-face time during this encounter.   Oneta Rack, NP   [facility] Intensive Outpatient Program Discharge Summary  JANNET CALIP [id number]  Admission date: 02/07/2021 Discharge date: 02/14/2021  Reason for admission: Per admission assessment note: Virginia Walls 40 year old African-American female presents with worsening depression due to the passing of her 40 year old.  Reports anniversary date of his passing is approaching.  She reports poor concentration, decreased appetite, anger and mood irritability.  Reports she is currently followed by therapist C. Hussami and a psychiatrist Eappen.  States she is prescribed Abilify, Topamax, Mirtazapine and Effexor. Patient to start intensive outpatient programming for 02/07/2021  Progress in Program Toward Treatment Goals: Jens Som attended and participated with daily group session with active and engaged participation. Staffed reported improvement with mood and affect. Tysheka reported mild anxiety with returning back to work.  States she enjoys the structure and routine that group has  provided.  Rates her anxiety as 7 out of 10 with 10 being the worst.  States her depression 6 out of 10.  Denying suicidal or homicidal ideations.  Denies auditory or visual hallucinations.  Denied medication refills at this time.  Patient to keep all outpatient follow-up appointments  Progress (rationale): Keep follow-up with Psychiatrist Eappen and Christina Hussami, LCSW. Case management provided additional outpatient resources with [facility]  [place].  Take all medications as prescribed. Keep all follow-up appointments as scheduled.  Do not consume alcohol or use illegal drugs while on prescription medications. Report any adverse effects from your medications to your primary care provider promptly.  In the event of recurrent symptoms or worsening symptoms, call 911, a crisis hotline, or go to the nearest emergency department for evaluation.   Oneta Rack, NP 02/14/2021

## 2021-02-14 NOTE — Patient Instructions (Signed)
D:  Patient completed MH-IOP today.  A:  Discharged today.  Follow up with Dr. Elna Breslow on 02-16-21 @ 8:30 a.m (in person) and Christina Hussami, LCSW on 02-19-21 @ 8 a.m (virtual).  Return to work on 02-15-21 without any restrictions.  Encouraged support groups; strongly recommended a grief/loss support group.  R:  Patient receptive.

## 2021-02-14 NOTE — Progress Notes (Signed)
Virtual Visit via Video Note  I connected with Virginia Walls on 02/14/21 at  9:00 AM EDT by a video enabled telemedicine application and verified that I am speaking with the correct person using two identifiers.  At orientation to the IOP program, Case Manager discussed the limitations of evaluation and management by telemedicine and the availability of in person appointments. The patient expressed understanding and agreed to proceed with virtual visits throughout the duration of the program.   Location:  Patient: Patient Home Provider: Home Office   History of Present Illness: MDD  Observations/Objective: Check In: Case Manager checked in with all participants to review discharge dates, insurance authorizations, work-related documents and needs from the treatment team regarding medications. Client stated needs and engaged in discussion. Case Manager introduced 2 new Clients to the group, with group members welcoming and starting the joining process.   Initial Therapeutic Activity: Counselor introduced Virginia Walls, [place] Chaplain to present information and discussion on Grief and Loss. Group members engaged in discussion, sharing how grief impacts them, what comforts them, what emotions are felt, labeling losses, etc. After guest speaker logged off, Counselor prompted group to spend 10-15 minutes journaling to process personal grief and loss situations. Counselor processed entries with group and client's identified areas for additional processing in individual therapy. Client noted grief related to loss of son.   Second Therapeutic Activity: Counselor continued the conversation on grief and loss, sharing an image of a sculpture inspired by grief. Counselor prompted group members to share their reflections, with all connecting with the intended meaning. Counselor then provided information on the various types of grief, expanding Client's vocabulary and understanding of grief and how it  presents. Group members related to the various types of grief they and others have experienced. Client engaged well in conversation.   Check Out: Counselor prompted group members to share what self-care practice or productivity activity they will engage in today. Client plans to prepare for her return to work. Client endorsed safety plan to be followed to prevent safety issues.   Assessment and Plan: Clinician recommends that Client remain in IOP treatment to better manage mental health symptoms, stabilization and to address treatment plan goals. Clinician recommends adherence to crisis/safety plan, taking medications as prescribed, and following up with medical professionals if any issues arise.   Follow Up Instructions: Clinician will send Webex link for next session. The Client was advised to call back or seek an in-person evaluation if the symptoms worsen or if the condition fails to improve as anticipated.     I provided 180 minutes of non-face-to-face time during this encounter.     Hilbert Odor, LCSW

## 2021-02-16 ENCOUNTER — Ambulatory Visit: Payer: 59 | Admitting: Psychiatry

## 2021-02-19 ENCOUNTER — Ambulatory Visit (INDEPENDENT_AMBULATORY_CARE_PROVIDER_SITE_OTHER): Payer: 59 | Admitting: Licensed Clinical Social Worker

## 2021-02-19 ENCOUNTER — Other Ambulatory Visit: Payer: Self-pay

## 2021-02-19 DIAGNOSIS — F411 Generalized anxiety disorder: Secondary | ICD-10-CM | POA: Diagnosis not present

## 2021-02-19 DIAGNOSIS — Z634 Disappearance and death of family member: Secondary | ICD-10-CM

## 2021-02-19 DIAGNOSIS — F333 Major depressive disorder, recurrent, severe with psychotic symptoms: Secondary | ICD-10-CM | POA: Diagnosis not present

## 2021-02-19 NOTE — Progress Notes (Signed)
Virtual Visit via Video Note  I connected with Virginia Walls on 02/19/21 at  8:00 AM EDT by a video enabled telemedicine application and verified that I am speaking with the correct person using two identifiers.  Location: Patient: home Provider: remote office [place], [place])   I discussed the limitations of evaluation and management by telemedicine and the availability of in person appointments. The patient expressed understanding and agreed to proceed.  I discussed the assessment and treatment plan with the patient. The patient was provided an opportunity to ask questions and all were answered. The patient agreed with the plan and demonstrated an understanding of the instructions.   The patient was advised to call back or seek an in-person evaluation if the symptoms worsen or if the condition fails to improve as anticipated.  I provided 60 minutes of non-face-to-face time during this encounter.   Virginia Walls R Virginia Levinson, LCSW    THERAPIST PROGRESS NOTE  Session Time: 8-9a  Participation Level: Active  Behavioral Response: Neat and Well GroomedAlertAnxious and Depressed  Type of Therapy: Individual Therapy  Treatment Goals addressed: Anxiety and Coping  Interventions: CBT and Other: bereavement counseling  Summary: Virginia Walls is a 40 y.o. female who presents with symptoms consistent with depression, anxiety, bereavement. Pt reports that overall mood has been stable and that she is managing situational stressors better than at last session. Pt recently completed IOP sessions successfully.   Allowed pt to explore and express thoughts and feelings about recent return to work. Pt admits that she was up at night worrying about it and had some login issues once she transitioned back to the workplace. Pt states that today is first day back to work and will go back through a revised training session.    Discussed relationships with family members: husband, son, MIL. Pts relationship  with husband fluctuates by the day--pt perceives that he is not understanding about her struggles with grief. Pt states that he is not reaching out for help and she feels that he is more irritable on a daily basis. Pts son has decided that he wants to go to school in [place] for gaming industry--pt feels that she will be "abandoned and all alone--how could he leave me in August when that's Virginia Walls's birthday".  Reminded pt that most universities start classes/move in August.   Discussed coping skills/group work that pt participated in during IOP sessions. Pt expressed that it was extremely helpful and that she was sad to have to say goodbye at the end of sessions. Encouraged pt to reach out and use the group resources that were given to pt.   Pt reviewed current self care, current projects working on, and pros/cons of returning to work. Praised pt for setting some healthy boundaries with friends requesting art commissions and with family members. Pt reflected on recent cruise that she took with family members and how it was bittersweet--she enjoyed the time with them but it continues to trigger grief/missing late son every time family gets together.   Continued recommendations are as follows: self care behaviors, positive social engagements, focusing on overall work/home/life balance, and focusing on positive physical and emotional wellness.   Suicidal/Homicidal: No  Therapist Response: Virginia Walls is working hard to reduce levels of irritability and increase normal social interaction with family and friends. Virginia Walls is continuing to develop the ability to recognize, accept, and cope with feelings of depression and utilize coping skills to manage feelings in the moment. Virginia Walls has plans to engage more in  physical and recreational activities that reflect increased energy and interest. These behaviors are reflective of both personal growth and progress. Treatment to continue as indicated.  Plan: Return again in 3  weeks.  Diagnosis: Axis I: Bereavement, Generalized Anxiety Disorder and Major Depression, Recurrent severe    Axis II: No diagnosis    Ernest Haber Lyan Moyano, LCSW 02/19/2021

## 2021-02-27 ENCOUNTER — Ambulatory Visit: Payer: 59 | Admitting: Psychiatry

## 2021-03-05 ENCOUNTER — Ambulatory Visit: Payer: Self-pay

## 2021-03-05 NOTE — Telephone Encounter (Signed)
Returned pt call. Pt has sty on right eye lid for the last 3 days. No pain, a little itchy. No eye redness or loss of visio. Pt does remember using a less than clean makeup brush.   Pt will apply warm compresses and call back or seek UC if redness or pain develops.  Reason for Disposition . Eyelid swelling began after use of a new facial cosmetic, hair care product or nail polish . Sty on eyelid  Answer Assessment - Initial Assessment Questions 1. LOCATION: "Which eye has the sty?" "Upper or lower eyelid?"    Right upper 2. SIZE: "How big is it?" (Note: standard pencil eraser is 6 mm)     small 3. EYELID: "Is the eyelid swollen?" If Yes, ask: "How much?"    some 4. REDNESS: "Has the redness spread onto the eyelid?"     no 5. ONSET: "When did you notice the sty?"     2 days ago 6. VISION: "Do you have blurred vision?"      no 7. PAIN: "Is it painful?" If Yes, ask: "How bad is the pain?"  (Scale 1-10; or mild, moderate, severe)     none 8. CONTACTS: "Do you wear contacts?"     no 9. OTHER SYMPTOMS: "Do you have any other symptoms?" (e.g., fever)     no 10. PREGNANCY: "Is there any chance you are pregnant?" "When was your last menstrual period?"       na  Protocols used: EYE - SWELLING-A-AH, STY-A-AH

## 2021-03-06 ENCOUNTER — Ambulatory Visit (HOSPITAL_COMMUNITY): Payer: 59 | Admitting: Psychiatry

## 2021-03-06 DIAGNOSIS — F333 Major depressive disorder, recurrent, severe with psychotic symptoms: Secondary | ICD-10-CM

## 2021-03-07 ENCOUNTER — Other Ambulatory Visit: Payer: Self-pay

## 2021-03-09 ENCOUNTER — Telehealth (INDEPENDENT_AMBULATORY_CARE_PROVIDER_SITE_OTHER): Payer: 59 | Admitting: Psychiatry

## 2021-03-09 ENCOUNTER — Other Ambulatory Visit: Payer: Self-pay

## 2021-03-09 ENCOUNTER — Encounter (HOSPITAL_COMMUNITY): Payer: Self-pay | Admitting: Psychiatry

## 2021-03-09 DIAGNOSIS — F333 Major depressive disorder, recurrent, severe with psychotic symptoms: Secondary | ICD-10-CM

## 2021-03-09 NOTE — Progress Notes (Signed)
Client unable to attend due to work responsibilities.

## 2021-03-09 NOTE — Progress Notes (Signed)
When patient did not connect by video at the time of appointment writer attempted to contact patient by phone.  After several attempts patient picked up the phone and said she was stuck at work and completely forgot about the appointment.  Patient advised to call the office back to reschedule.

## 2021-03-11 NOTE — Progress Notes (Signed)
Virtual Visit via Video Note  I connected with Virginia Walls on 02/06/21 at  9:00 AM EDT by a video enabled telemedicine application and verified that I am speaking with the correct person using two identifiers.  Location: Patient: patient home Provider: clinical home office   At orientation to the IOP program, Case Managerdiscussed the limitations of evaluation and management by telemedicine and the availability of in person appointments. The patient expressed understanding and agreed to proceed with virtual visits throughout the duration of the program.  History of Present Illness: MDD   Observations/Objective: 9:00 - 10:00: Clinician led check-in regarding current stressors and situation.Clinician utilized active listening and empathetic response and validated patient emotions. Clinician facilitated processing group on pertinent issues.  Patient arrived within time allowed and reports that she is feeling "not too good." Patient rates hermood at [facility] a scale of 1-10 with 10 being great. Pt reports she struggled with a migraine yesterday and stayed in bed most of the day. Pt denies SI/HI. Pt able to process. Pt engaged in discussion.   10:00 - 11:00: Cln introduced topic of boundaries. Cln discussed how boundaries inform our relationships and affect self-esteem and personal agency. Group discussed the three types of boundaries: rigid, porous, and healthy and when each type is most helpful/harmful.  Pt engaged in discussion and reports having mostly porous boundaries.    11:00 - 12:00: Cln continued topic of boundaries. Cln discussed the different ways boundaries present: physical, emotional, intellectual, sexual, material, and time. Group talked about the ways in which each type presents for them and is a struggle.  Pt engaged in discussion and reports most issue with emotional boundaries.   At check-out, cln prompted group members to utilize skills and engage in self-care during their  afternoon. Pt denies SI/HI and will follow safety plan should safety issues arise.  Assessment and Plan: Clinician recommends that Client remain in IOP treatment to better manage mental health symptoms, stabilization and to address treatment plan goals. Clinician recommends adherence to crisis/safety plan, taking medications as prescribed, and following up with medical professionals if any issues arise.  Follow Up Instructions: Clinician will send Webex link for next session. The Client was advised to call back or seek an in-person evaluation if the symptoms worsen or if the condition fails to improve as anticipated.   I provided 180 minutes of non-face-to-face time during this encounter.   Donia Guiles, LCSW

## 2021-03-11 NOTE — Progress Notes (Signed)
Virtual Visit via Video Note  I connected with Virginia Walls on 02/09/21 at  9:00 AM EDT by a video enabled telemedicine application and verified that I am speaking with the correct person using two identifiers.  Location: Patient: patient home Provider: clinical home office   At orientation to the IOP program, Case Managerdiscussed the limitations of evaluation and management by telemedicine and the availability of in person appointments. The patient expressed understanding and agreed to proceed with virtual visits throughout the duration of the program.  History of Present Illness: MDD   Observations/Objective: 9:00 - 10:00: Clinician led check-in regarding current stressors and situation.Clinician utilized active listening and empathetic response and validated patient emotions. Clinician facilitated processing group on pertinent issues.  Patient arrived within time allowed and reports that she is feeling "annoyed." Patient rates hermood at [facility] a scale of 1-10 with 10 being great. Pt reports she woke up with a headache. Pt states having tension with her husband yesterday that made her anxious. Pt denies SI/HI. Pt able to process. Pt engaged in discussion.   10:00 - 11:00: Cln led discussion on the 5 stages of grief and the way loss affects [place]. Cln encouraged pt to consider grief as a journey to accepting a new future. Cln created space for pt to process grief concerns and validated pt's experiences.  Pt engaged in discussion and was able to identify areas of loss and process.  *Pt chose to leave group at 11:00 due to a family issue. Pt denies SI/HI before leaving.  Assessment and Plan: Clinician recommends that Client remain in IOP treatment to better manage mental health symptoms, stabilization and to address treatment plan goals. Clinician recommends adherence to crisis/safety plan, taking medications as prescribed, and following up with medical professionals if any issues  arise.  Follow Up Instructions: Clinician will send Webex link for next session. The Client was advised to call back or seek an in-person evaluation if the symptoms worsen or if the condition fails to improve as anticipated.   I provided 120 minutes of non-face-to-face time during this encounter.   Donia Guiles, LCSW

## 2021-03-11 NOTE — Progress Notes (Signed)
Virtual Visit via Video Note  I connected with Virginia Walls on 02/07/21 at  9:00 AM EDT by a video enabled telemedicine application and verified that I am speaking with the correct person using two identifiers.  Location: Patient: patient home Provider: clinical home office   At orientation to the IOP program, Case Managerdiscussed the limitations of evaluation and management by telemedicine and the availability of in person appointments. The patient expressed understanding and agreed to proceed with virtual visits throughout the duration of the program.  History of Present Illness: MDD   Observations/Objective: 9:00 - 10:00: Pharmacist, Verna Czech, led pharmacy group educating on medications and the role medication plays in mental wellness.  Pt participated in group.   10:00 - 11:00: Clinician led check-in regarding current stressors and situation.Clinician utilized active listening and empathetic response and validated patient emotions. Clinician facilitated processing group on pertinent issues.  Patient arrived within time allowed and reports that she is feeling "good." Patient rates hermood at [facility] a scale of 1-10 with 10 being great. Pt reports she woke up "in a good place" however is struggling with a stomach ache. Pt states she struggled with sleep yesterday. Pt denies SI/HI. Pt able to process. Pt engaged in discussion.    11:00 - 12:00: Guest speaker, Virginia Walls, led discussion on wellness and how we can support our mental health by engaging in overall wellness practices. Pt participated in group.  At check-out, cln prompted group members to utilize skills and engage in self-care during their afternoon. Pt denies SI/HI and will follow safety plan should safety issues arise.  Assessment and Plan: Clinician recommends that Client remain in IOP treatment to better manage mental health symptoms, stabilization and to address treatment plan goals. Clinician recommends adherence to  crisis/safety plan, taking medications as prescribed, and following up with medical professionals if any issues arise.  Follow Up Instructions: Clinician will send Webex link for next session. The Client was advised to call back or seek an in-person evaluation if the symptoms worsen or if the condition fails to improve as anticipated.   I provided 180 minutes of non-face-to-face time during this encounter.   Donia Guiles, LCSW

## 2021-03-11 NOTE — Progress Notes (Signed)
Virtual Visit via Video Note  I connected with Virginia Walls on 02/08/21 at  9:00 AM EDT by a video enabled telemedicine application and verified that I am speaking with the correct person using two identifiers.  Location: Patient: patient home Provider: clinical home office   At orientation to the IOP program, Case Managerdiscussed the limitations of evaluation and management by telemedicine and the availability of in person appointments. The patient expressed understanding and agreed to proceed with virtual visits throughout the duration of the program.  History of Present Illness: MDD   Observations/Objective: 9:00 - 10:00: Clinician led check-in regarding current stressors and situation.Clinician utilized active listening and empathetic response and validated patient emotions. Clinician facilitated processing group on pertinent issues.  Patient arrived within time allowed and reports that she is feeling "tired." Patient rates hermood at [facility] a scale of 1-10 with 10 being great. Pt reports she was up frequently throughout the night and slept poorly. Pt reports she struggled with motivation yesterday and negative self-talk. Pt denies SI/HI. Pt able to process. Pt engaged in discussion.   10:00 - 11:00: Cln led discussion on the 5 stages of grief and the way loss affects [place]. Cln encouraged pt to consider grief as a journey to accepting a new future. Cln created space for pt to process grief concerns and validated pt's experiences.  Pt engaged in discussion and was able to identify areas of loss and process.    11:00 - 12:00: Cln led activity on finding what makes you happy. Cln tasked group members to consider times in which they have been happy in the past, moments in which they have felt less distressed recently, and things that give them any rumblings of joy. Cln worked with pt's to process barriers and to consider one thing for now, not the bigger picture.  Pt engaged in discussion.  Pt able to process and come up with one thing thing to do to make herself happy.    At check-out, cln prompted group members to utilize skills and engage in self-care during their afternoon. Pt denies SI/HI and will follow safety plan should safety issues arise.  Assessment and Plan: Clinician recommends that Client remain in IOP treatment to better manage mental health symptoms, stabilization and to address treatment plan goals. Clinician recommends adherence to crisis/safety plan, taking medications as prescribed, and following up with medical professionals if any issues arise.  Follow Up Instructions: Clinician will send Webex link for next session. The Client was advised to call back or seek an in-person evaluation if the symptoms worsen or if the condition fails to improve as anticipated.   I provided 180 minutes of non-face-to-face time during this encounter.   Donia Guiles, LCSW

## 2021-03-13 ENCOUNTER — Ambulatory Visit (INDEPENDENT_AMBULATORY_CARE_PROVIDER_SITE_OTHER): Payer: 59 | Admitting: Psychiatry

## 2021-03-13 ENCOUNTER — Other Ambulatory Visit: Payer: Self-pay

## 2021-03-13 DIAGNOSIS — F333 Major depressive disorder, recurrent, severe with psychotic symptoms: Secondary | ICD-10-CM | POA: Diagnosis not present

## 2021-03-15 ENCOUNTER — Encounter (HOSPITAL_COMMUNITY): Payer: Self-pay | Admitting: Psychiatry

## 2021-03-15 NOTE — Progress Notes (Signed)
Virtual Visit via Video Note  I connected with Virginia Walls on 03/13/21 at 12:00 PM EDT by a video enabled telemedicine application and verified that I am speaking with the correct person using two identifiers.  I discussed the limitations of evaluation and management by telemedicine and the availability of in person appointments. The patient expressed understanding and agreed to proceed.    Location: Patient: Patient Home Provider: Home Office   GROUP GOAL: Client will attend IOP Aftercare Group Therapy 2-4x a month to connect with peers and to apply strategies discussed to improve mental health condition.  History of Present Illness: MDD  Observations/Objective: Counselor met with Patient in the context of Group Therapy, via Webex. Counselor prompted Patient to share updates on coping skill application and individual therapeutic process in management of mental health. Client reports intentional efforts to maintain mental health.  Counselor prompted Patient to share areas of concern, challenges and identify barriers to meeting current goals. Topics covered: holistic approach to care, alternative coping skills, behavior management, group support, labeling hope, transitioning care when provider retires, and depressive symptom management. Patient engaged in discussion, provided feedback for others within the group and took note of additional strategies reviewed and discussed.   Assessment and Plan: Counselor recommends client continue following treatment plan goals, following crisis plan and following up with behavioral health and medical providers as needed. Patient is welcome to join group at next session.   Follow Up Instructions: Counselor to provide link for next session via Webex platform. The patient was advised to call back or seek an in-person evaluation if the symptoms worsen or if the condition fails to improve as anticipated.  I provided 80 minutes of non-face-to-face time during  this encounter.   Lise Auer, LCSW

## 2021-03-21 ENCOUNTER — Other Ambulatory Visit: Payer: Self-pay

## 2021-03-21 ENCOUNTER — Ambulatory Visit (INDEPENDENT_AMBULATORY_CARE_PROVIDER_SITE_OTHER): Payer: 59 | Admitting: Licensed Clinical Social Worker

## 2021-03-21 DIAGNOSIS — Z634 Disappearance and death of family member: Secondary | ICD-10-CM | POA: Diagnosis not present

## 2021-03-21 DIAGNOSIS — F333 Major depressive disorder, recurrent, severe with psychotic symptoms: Secondary | ICD-10-CM

## 2021-03-21 DIAGNOSIS — F411 Generalized anxiety disorder: Secondary | ICD-10-CM

## 2021-03-22 NOTE — Progress Notes (Signed)
Virtual Visit via Video Note  I connected with [name] on 03/22/21 at  4:00 PM EDT by a video enabled telemedicine application and verified that I am speaking with the correct person using two identifiers.  Location: Patient: home Provider: [facility]   I discussed the limitations of evaluation and management by telemedicine and the availability of in person appointments. The patient expressed understanding and agreed to proceed.   I discussed the assessment and treatment plan with the patient. The patient was provided an opportunity to ask questions and all were answered. The patient agreed with the plan and demonstrated an understanding of the instructions.   The patient was advised to call back or seek an in-person evaluation if the symptoms worsen or if the condition fails to improve as anticipated.  I provided 40 minutes of non-face-to-face time during this encounter.   Virginia Walls R Virginia Bass, LCSW    THERAPIST PROGRESS NOTE  Session Time: 4:05-4:45  Participation Level: Active  Behavioral Response: Neat and Well GroomedAlertDepressed  Type of Therapy: Individual Therapy  Treatment Goals addressed: Coping  Interventions: CBT, Motivational Interviewing and Supportive  Summary: Virginia Walls is a 40 y.o. female who presents with improving symptoms related to depression, anxiety, and bereavement diagnoses. Pt reports that overall mood has improved and that she is using coping skills to manage stressful situations and overall anxiety. Pt reporting improvements in quality and quantity of sleep.  Allowed pt to explore and express thoughts and feelings associated with recent life situations and external stressors. Pt reports that recently she went on a weekend trip to [place] with her husband and it was a great reconnection with him.   Allowed pt to explore her continuing grief journey--pt still have very emotional episodes--pt feels she can sit with her grief and pain better now  and does not feel as consumed by it as she has in the past.  Allowed pt to time-hop from present to past and back and asked pt to identify areas in which she has grown in one year. Pt was able to identify many areas where she has grown and healed.  Pt got the news that she is going to be a grandmother and is very excited about a new baby coming into the family.  Continued recommendations are as follows: self care behaviors, positive social engagements, focusing on overall work/home/life balance, and focusing on positive physical and emotional wellness.   Suicidal/Homicidal: No  Therapist Response: Virginia Walls is engaging in physical and recreational activities that are helping manage mood and stress. Virginia Walls is actively participating in relationship-building activities with husband. Virginia Walls is being intentional about implementing calming skills to reduce overall tension. Virginia Walls is able to identify and assess current and past mood episodes including features, frequency, intensity, and duration. Virginia Walls is trying to exercise more as a depression reduction technique. These behaviors are reflective of both personal growth and progress. Treatment to continue as indicated.   Plan: Return again in 4 weeks. Revised treatment plan to reflect progress.  Diagnosis: Axis I: Bereavement, Generalized Anxiety Disorder and Major Depression, Recurrent severe    Axis II: No diagnosis    Virginia Walls Virginia Benning, LCSW 03/22/2021

## 2021-04-03 ENCOUNTER — Other Ambulatory Visit: Payer: Self-pay

## 2021-04-03 ENCOUNTER — Encounter (HOSPITAL_COMMUNITY): Payer: Self-pay | Admitting: Psychiatry

## 2021-04-03 ENCOUNTER — Ambulatory Visit (INDEPENDENT_AMBULATORY_CARE_PROVIDER_SITE_OTHER): Payer: 59 | Admitting: Psychiatry

## 2021-04-03 DIAGNOSIS — F333 Major depressive disorder, recurrent, severe with psychotic symptoms: Secondary | ICD-10-CM

## 2021-04-03 NOTE — Progress Notes (Signed)
Virtual Visit via Video Note  I connected with Virginia Walls on 04/03/21 at 12:00 PM EDT by a video enabled telemedicine application and verified that I am speaking with the correct person using two identifiers.  I discussed the limitations of evaluation and management by telemedicine and the availability of in person appointments. The patient expressed understanding and agreed to proceed.    Location: Patient: Patient Home Provider: Home Office   GROUP GOAL: Client will attend IOP Aftercare Group Therapy 2-4x a month to connect with peers and to apply strategies discussed to improve mental health condition.  History of Present Illness: MDD   Observations/Objective: Counselor met with Patient in the context of Group Therapy, via Webex. Counselor prompted Patient to share updates on coping skill application and individual therapeutic process in management of mental health. Client reports intentional efforts to maintain mental health.  Counselor prompted Patient to share areas of concern, challenges and identify barriers to meeting current goals. Topics covered: declining ADLs, isolating, boundary violations, assertive communication, coping with grief reminders and trauma triggers, body image issues, sleep concerns, issues and with providers.  Patient engaged in discussion, provided feedback for others within the group and took note of additional strategies reviewed and discussed.   Assessment and Plan: Counselor recommends client continue following treatment plan goals, following crisis plan and following up with behavioral health and medical providers as needed. Patient is welcome to join group at next session.   Follow Up Instructions: Counselor to provide link for next session via Webex platform. The patient was advised to call back or seek an in-person evaluation if the symptoms worsen or if the condition fails to improve as anticipated.  I provided 85 minutes of non-face-to-face time  during this encounter.   Lise Auer, LCSW

## 2021-04-10 ENCOUNTER — Ambulatory Visit (INDEPENDENT_AMBULATORY_CARE_PROVIDER_SITE_OTHER): Payer: 59 | Admitting: Psychiatry

## 2021-04-10 ENCOUNTER — Other Ambulatory Visit: Payer: Self-pay

## 2021-04-10 DIAGNOSIS — F333 Major depressive disorder, recurrent, severe with psychotic symptoms: Secondary | ICD-10-CM

## 2021-04-12 ENCOUNTER — Encounter (HOSPITAL_COMMUNITY): Payer: Self-pay | Admitting: Psychiatry

## 2021-04-12 NOTE — Progress Notes (Signed)
Virtual Visit via Video Note  I connected with Virginia Walls on 04/10/21 at 12:00 PM EDT by a video enabled telemedicine application and verified that I am speaking with the correct person using two identifiers.  I discussed the limitations of evaluation and management by telemedicine and the availability of in person appointments. The patient expressed understanding and agreed to proceed.    Location: Patient: Patient Home Provider: Home Office   GROUP GOAL: Client will attend IOP Aftercare Group Therapy 2-4x a month to connect with peers and to apply strategies discussed to improve mental health condition.  History of Present Illness: MDD  Observations/Objective: Counselor met with Patient in the context of Group Therapy, via Webex. Counselor prompted Patient to share updates on coping skill application and individual therapeutic process in management of mental health. Client reports intentional efforts to maintain mental health.  Counselor prompted Patient to share areas of concern, challenges and identify barriers to meeting current goals. Topics covered: low energy and motivation, being intentional with time and schedule, father's day grief reminders, expanding support system, trying new activities in the community, medication side effects, Disability Process, health related issues, medical trauma, and self-care practices.  Patient engaged in discussion, provided feedback for others within the group and took note of additional strategies reviewed and discussed.   Assessment and Plan: Counselor recommends client continue following treatment plan goals, following crisis plan and following up with behavioral health and medical providers as needed. Patient is welcome to join group at next session.   Follow Up Instructions: Counselor to provide link for next session via Webex platform. The patient was advised to call back or seek an in-person evaluation if the symptoms worsen or if the  condition fails to improve as anticipated.  I provided 85 minutes of non-face-to-face time during this encounter.   Lise Auer, LCSW

## 2021-04-17 ENCOUNTER — Ambulatory Visit (INDEPENDENT_AMBULATORY_CARE_PROVIDER_SITE_OTHER): Payer: 59 | Admitting: Psychiatry

## 2021-04-17 ENCOUNTER — Encounter (HOSPITAL_COMMUNITY): Payer: Self-pay | Admitting: Psychiatry

## 2021-04-17 ENCOUNTER — Other Ambulatory Visit: Payer: Self-pay

## 2021-04-17 DIAGNOSIS — F333 Major depressive disorder, recurrent, severe with psychotic symptoms: Secondary | ICD-10-CM | POA: Diagnosis not present

## 2021-04-17 NOTE — Progress Notes (Signed)
Virtual Visit via Video Note  I connected with Virginia Walls on 04/17/21 at 12:00 PM EDT by a video enabled telemedicine application and verified that I am speaking with the correct person using two identifiers.  I discussed the limitations of evaluation and management by telemedicine and the availability of in person appointments. The patient expressed understanding and agreed to proceed.    Location: Patient: Patient Home Provider: Home Office   GROUP GOAL: Client will attend IOP Aftercare Group Therapy 2-4x a month to connect with peers and to apply strategies discussed to improve mental health condition.  History of Present Illness: MDD  Observations/Objective: Counselor met with Patient in the context of Group Therapy, via Webex. Counselor prompted Patient to share updates on coping skill application and individual therapeutic process in management of mental health. Client reports intentional efforts to maintain mental health.  Counselor prompted Patient to share areas of concern, challenges and identify barriers to meeting current goals. Group topics covered: current events impact on MH, medical issues impacting mental health, substance abstinence maintenance, childhood traumas, increased socialization, intimate partner communication, medication concerns, and grief and loss.  Patient engaged in discussion, provided feedback for others within the group and took note of additional strategies reviewed and discussed.   Assessment and Plan: Counselor recommends client continue following treatment plan goals, following crisis plan and following up with behavioral health and medical providers as needed. Patient is welcome to join group at next session.   Follow Up Instructions: Counselor to provide link for next session via Webex platform. The patient was advised to call back or seek an in-person evaluation if the symptoms worsen or if the condition fails to improve as anticipated.  I  provided 90 minutes of non-face-to-face time during this encounter.   Lise Auer, LCSW

## 2021-05-08 ENCOUNTER — Ambulatory Visit (INDEPENDENT_AMBULATORY_CARE_PROVIDER_SITE_OTHER): Payer: 59 | Admitting: Psychiatry

## 2021-05-08 ENCOUNTER — Other Ambulatory Visit: Payer: Self-pay

## 2021-05-08 DIAGNOSIS — F411 Generalized anxiety disorder: Secondary | ICD-10-CM

## 2021-05-08 DIAGNOSIS — F333 Major depressive disorder, recurrent, severe with psychotic symptoms: Secondary | ICD-10-CM

## 2021-05-09 ENCOUNTER — Encounter (HOSPITAL_COMMUNITY): Payer: Self-pay | Admitting: Psychiatry

## 2021-05-09 NOTE — Progress Notes (Signed)
Virtual Visit via Video Note  I connected with Virginia Walls on 05/08/21 at 12:00 PM EDT by a video enabled telemedicine application and verified that I am speaking with the correct person using two identifiers.  I discussed the limitations of evaluation and management by telemedicine and the availability of in person appointments. The patient expressed understanding and agreed to proceed.    Location: Patient: Patient Home Provider: Home Office   GROUP GOAL: Client will attend IOP Aftercare Group Therapy 2-4x a month to connect with peers and to apply strategies discussed to improve mental health condition.  History of Present Illness: MDD and GAD  Observations/Objective: Counselor met with Patient in the context of Group Therapy, via Webex. Counselor prompted Patient to share updates on coping skill application and individual therapeutic process in management of mental health. Client reports intentional efforts to maintain mental health.  Counselor prompted Patient to share areas of concern, challenges and identify barriers to meeting current goals. Group topics covered: group restructuring, grief and loss, helpful mental health apps, preventing panic attacks. Self-care, utilizing community support groups and other healthy outlets.  Patient engaged in discussion, provided feedback for others within the group and took note of additional strategies reviewed and discussed.   Assessment and Plan: Counselor recommends client continue following treatment plan goals, following crisis plan and following up with behavioral health and medical providers as needed. Patient is welcome to join group at next session.   Follow Up Instructions: Counselor to provide link for next session via Webex platform. The patient was advised to call back or seek an in-person evaluation if the symptoms worsen or if the condition fails to improve as anticipated.  I provided 70 minutes of non-face-to-face time during  this encounter.   Lise Auer, LCSW

## 2021-06-12 ENCOUNTER — Other Ambulatory Visit: Payer: Self-pay

## 2021-06-12 ENCOUNTER — Ambulatory Visit (INDEPENDENT_AMBULATORY_CARE_PROVIDER_SITE_OTHER): Payer: 59 | Admitting: Psychiatry

## 2021-06-12 DIAGNOSIS — F333 Major depressive disorder, recurrent, severe with psychotic symptoms: Secondary | ICD-10-CM | POA: Diagnosis not present

## 2021-06-15 ENCOUNTER — Encounter (HOSPITAL_COMMUNITY): Payer: Self-pay | Admitting: Psychiatry

## 2021-06-15 NOTE — Progress Notes (Signed)
Virtual Visit via Video Note  I connected with Virginia Walls on 06/12/21 at 12:00 PM EDT by a video enabled telemedicine application and verified that I am speaking with the correct person using two identifiers.  I discussed the limitations of evaluation and management by telemedicine and the availability of in person appointments. The patient expressed understanding and agreed to proceed.    Location: Patient: Patient Home Provider: Home Office   GROUP GOAL: Client will attend IOP Aftercare Group Therapy 2-4x a month to connect with peers and to apply strategies discussed to improve mental health condition.  History of Present Illness: MDD  Observations/Objective: Counselor met with Patient in the context of Group Therapy, via Webex. Counselor prompted Patient to share updates on coping skill application and individual therapeutic process in management of mental health. Client reports intentional efforts to maintain mental health.  Counselor prompted Patient to share areas of concern, challenges and identify barriers to meeting current goals. Group topics covered: local resources, anger, grief and loss, progress in functioning, utilizing natural supports, communication with providers, medication management and concerns, and improving PTSD symptoms.  Patient engaged in discussion, provided feedback for others within the group and took note of additional strategies reviewed and discussed.   Assessment and Plan: Counselor recommends client continue following treatment plan goals, following crisis plan and following up with behavioral health and medical providers as needed. Patient is welcome to join group at next session.   Follow Up Instructions: Counselor to provide link for next session via Webex platform. The patient was advised to call back or seek an in-person evaluation if the symptoms worsen or if the condition fails to improve as anticipated.  I provided 90 minutes of non-face-to-face  time during this encounter.   Lise Auer, LCSW

## 2021-06-19 ENCOUNTER — Ambulatory Visit (INDEPENDENT_AMBULATORY_CARE_PROVIDER_SITE_OTHER): Payer: 59 | Admitting: Psychiatry

## 2021-06-19 ENCOUNTER — Other Ambulatory Visit: Payer: Self-pay

## 2021-06-19 DIAGNOSIS — F411 Generalized anxiety disorder: Secondary | ICD-10-CM | POA: Diagnosis not present

## 2021-06-19 DIAGNOSIS — F333 Major depressive disorder, recurrent, severe with psychotic symptoms: Secondary | ICD-10-CM | POA: Diagnosis not present

## 2021-06-20 NOTE — Progress Notes (Signed)
Virtual Visit via Video Note  I connected with Virginia Walls on 06/19/21 at  2:00 PM EDT by a video enabled telemedicine application and verified that I am speaking with the correct person using two identifiers.  I discussed the limitations of evaluation and management by telemedicine and the availability of in person appointments. The patient expressed understanding and agreed to proceed.    Location: Patient: Patient Home Provider: Home Office   GROUP GOAL: Client will attend IOP Aftercare Group Therapy 2-4x a month to connect with peers and to apply strategies discussed to improve mental health condition.  History of Present Illness: MDD and GAD  Observations/Objective: Counselor met with Patient in the context of Group Therapy, via Webex. Counselor prompted Patient to share updates on coping skill application and individual therapeutic process in management of mental health. Client reports intentional efforts to maintain mental health.  Counselor prompted Patient to share areas of concern, challenges and identify barriers to meeting current goals. Group topics covered: specialized treatment process and outcomes, continuity of care, community resources, positive affirmations, grief work, boundaries, regaining self-confidence, combating depression and anxiety with coping strategies.  Patient engaged in discussion, provided feedback for others within the group and took note of additional strategies reviewed and discussed.   Assessment and Plan: Counselor recommends client continue following treatment plan goals, following crisis plan and following up with behavioral health and medical providers as needed. Patient is welcome to join group at next session.   Follow Up Instructions: Counselor to provide link for next session via Webex platform. The patient was advised to call back or seek an in-person evaluation if the symptoms worsen or if the condition fails to improve as anticipated.  I  provided 60 minutes of non-face-to-face time during this encounter.   Lise Auer, LCSW

## 2021-06-26 ENCOUNTER — Encounter (HOSPITAL_COMMUNITY): Payer: Self-pay | Admitting: Psychiatry

## 2021-06-26 ENCOUNTER — Other Ambulatory Visit: Payer: Self-pay

## 2021-06-26 ENCOUNTER — Ambulatory Visit (HOSPITAL_COMMUNITY): Payer: 59 | Admitting: Psychiatry

## 2021-07-02 ENCOUNTER — Other Ambulatory Visit: Payer: Self-pay

## 2021-07-02 ENCOUNTER — Encounter: Payer: Self-pay | Admitting: Emergency Medicine

## 2021-07-02 ENCOUNTER — Ambulatory Visit
Admission: EM | Admit: 2021-07-02 | Discharge: 2021-07-02 | Disposition: A | Discharge status: Home / Self Care | Payer: Managed Care, Other (non HMO) | Attending: Emergency Medicine | Admitting: Emergency Medicine

## 2021-07-02 DIAGNOSIS — R300 Dysuria: Secondary | ICD-10-CM | POA: Diagnosis not present

## 2021-07-02 DIAGNOSIS — N390 Urinary tract infection, site not specified: Secondary | ICD-10-CM | POA: Diagnosis not present

## 2021-07-02 LAB — POCT URINALYSIS DIP (MANUAL ENTRY)
Bilirubin, UA: NEGATIVE
Glucose, UA: NEGATIVE mg/dL
Nitrite, UA: NEGATIVE
Protein Ur, POC: 100 mg/dL — AB
Spec Grav, UA: 1.025 (ref 1.010–1.025)
Urobilinogen, UA: 0.2 E.U./dL
pH, UA: 5.5 (ref 5.0–8.0)

## 2021-07-02 MED ORDER — CEPHALEXIN 500 MG PO CAPS: 500.0000 mg | ORAL_CAPSULE | Freq: Two times a day (BID) | ORAL | 0 refills | Status: AC

## 2021-07-02 NOTE — ED Triage Notes (Signed)
Pt states she had lower back pain last week. Today she has blood in her urine and painful urination.

## 2021-07-02 NOTE — ED Provider Notes (Signed)
Subjective:    Virginia Walls is a very pleasant 40 y.o. female who presents with concerns for UTI due to low back pain that started last week which has since subsided.  Patient reports noticing blood in her urine and painful urination today. No unilateral back pain, vomiting, fever, vaginal discharge, concern for STD.  Past medical history, past surgical history, current medications reviewed.  Allergies: is allergic to corylus and hydrocodone.  Review of Systems See HPI   Objective:     Vitals:   07/02/21 1901  BP: 126/89  Pulse: 82  Temp: 99.6 F (37.6 C)  SpO2: 97%     General: Appears well-develop Pulm/Chest: No respiratory distress Abdominal: No CVAT.  Neurological: Alert and oriented to person, place, and time.  Skin: Skin is warm and dry.  Psychiatric: Normal mood, affect, behavior, and thought content.  GU:  Deferred secondary to self collect specimen  Laboratory:  Orders Placed This Encounter  Procedures   Urine Culture   POCT urinalysis dipstick   Results for orders placed or performed during the hospital encounter of 07/02/21  POCT urinalysis dipstick  Result Value Ref Range   Color, UA yellow yellow   Clarity, UA cloudy (A) clear   Glucose, UA negative negative mg/dL   Bilirubin, UA negative negative   Ketones, POC UA moderate (40) (A) negative mg/dL   Spec Grav, UA 7.619 5.093 - 1.025   Blood, UA large (A) negative   pH, UA 5.5 5.0 - 8.0   Protein Ur, POC =100 (A) negative mg/dL   Urobilinogen, UA 0.2 0.2 or 1.0 E.U./dL   Nitrite, UA Negative Negative   Leukocytes, UA Small (1+) (A) Negative     Assessment:   1. Acute UTI - Urine Culture; Standing - Urine Culture - cephALEXin (KEFLEX) 500 MG capsule; Take 1 capsule (500 mg total) by mouth 2 (two) times daily for 7 days.  Dispense: 14 capsule; Refill: 0   Plan:   MDM: Patient presents with concerns for UTI due to low back pain that started last week which has since subsided.  Patient  reports noticing blood in her urine and painful urination today. No unilateral back pain, vomiting, fever, vaginal discharge, concern for STD.  Chart review completed.  Urinalysis reveals cloudy urine, moderate ketones, large blood, 100 protein and small leukocytes.  Urine culture pending.  Rx Keflex to the patient's preferred pharmacy to treat an acute UTI.  Advised that she can use Uristat or Azo over-the-counter to help with discomfort.  Return to clinic or go to the ER for any fever, one-sided back pain or vomiting.  Patient verbalized understanding and agreed with plan.  Patient stable upon discharge.  Return as needed.    Discharge Instructions      You were seen today for an infection in the lower urinary tract. Your urine sample today was sent for a culture. The urine culture will show what type of bacteria grows and if you are on the appropriate antibiotic. If the antibiotic needs to be changed, you will receive a phone call from the follow up nurse who will give you more information. If you do not receive a call, then you are on the correct antibiotic.  Take antibiotics as directed. Finish course even if feeling better sooner. Drink plenty of clear fluids. Over the counter "Uristat" or "Azo Standard" may help your discomfort.  This will turn your urine dark orange or red and may stain contacts (remove before using). You may experience  24 - 48 hours of continuing discomfort until medication controls the infection.  Return to clinic or go to the ER if you develop a fever, one-sided back pain, or vomiting as these are signs of a worsening infection.          Amalia Greenhouse, FNP 07/02/21 1939

## 2021-07-02 NOTE — Discharge Instructions (Addendum)

## 2021-07-04 LAB — URINE CULTURE

## 2021-10-31 ENCOUNTER — Telehealth: Payer: Self-pay

## 2021-10-31 ENCOUNTER — Encounter: Payer: Self-pay | Admitting: Family Medicine

## 2021-10-31 ENCOUNTER — Ambulatory Visit (INDEPENDENT_AMBULATORY_CARE_PROVIDER_SITE_OTHER): Payer: Managed Care, Other (non HMO) | Admitting: Family Medicine

## 2021-10-31 VITALS — BP 130/82 | HR 82 | Temp 98.1°F | Resp 16 | Ht 64.0 in | Wt 180.0 lb

## 2021-10-31 DIAGNOSIS — Z23 Encounter for immunization: Secondary | ICD-10-CM | POA: Diagnosis not present

## 2021-10-31 DIAGNOSIS — F333 Major depressive disorder, recurrent, severe with psychotic symptoms: Secondary | ICD-10-CM

## 2021-10-31 DIAGNOSIS — E559 Vitamin D deficiency, unspecified: Secondary | ICD-10-CM

## 2021-10-31 DIAGNOSIS — Z79899 Other long term (current) drug therapy: Secondary | ICD-10-CM | POA: Diagnosis not present

## 2021-10-31 DIAGNOSIS — Z0000 Encounter for general adult medical examination without abnormal findings: Secondary | ICD-10-CM | POA: Diagnosis not present

## 2021-10-31 DIAGNOSIS — Z130 Encounter for screening for diseases of the blood and blood-forming organs and certain disorders involving the immune mechanism: Secondary | ICD-10-CM

## 2021-10-31 DIAGNOSIS — F411 Generalized anxiety disorder: Secondary | ICD-10-CM | POA: Diagnosis not present

## 2021-10-31 DIAGNOSIS — Z131 Encounter for screening for diabetes mellitus: Secondary | ICD-10-CM

## 2021-10-31 DIAGNOSIS — Z13220 Encounter for screening for lipoid disorders: Secondary | ICD-10-CM

## 2021-10-31 DIAGNOSIS — Z1231 Encounter for screening mammogram for malignant neoplasm of breast: Secondary | ICD-10-CM

## 2021-10-31 NOTE — Progress Notes (Signed)
Name: Virginia Walls   MRN: [id number]    DOB: December 23, 1980   Date:10/31/2021       Progress Note  Subjective  Chief Complaint  Annual Exam  HPI  Patient presents for annual CPE.  Diet: skipping breakfast, eating around 11:30 , eating dinner around 6 pm. Avoiding salt. Choosing healthier snacks  Exercise: she recently started working out at home for 20 minutes about 4 days a week, advised 150 minutes total per week   [place] Visit from 10/31/2021 in [facility]  AUDIT-C Score 2      Depression: Phq 9 is  positive Depression screen [facility] 2/9 10/31/2021 08/21/2020 08/03/2020 06/13/2020 03/15/2020  Decreased Interest '2 2 2 3 3  ' Down, Depressed, Hopeless '2 3 2 3 3  ' PHQ - 2 Score '4 5 4 6 6  ' Altered sleeping '3 2 2 3 3  ' Tired, decreased energy '3 2 2 3 3  ' Change in appetite 0 '2 2 3 3  ' Feeling bad or failure about yourself  0 '2 2 2 3  ' Trouble concentrating '2 2 2 2 3  ' Moving slowly or fidgety/restless 0 '2 2 3 3  ' Suicidal thoughts 0 0 1 0 2  PHQ-9 Score '12 17 17 22 26  ' Difficult doing work/chores - Somewhat difficult Very difficult Somewhat difficult Extremely dIfficult  Some encounter information is confidential and restricted. Go to Review Flowsheets activity to see all data.  Some recent data might be hidden   Hypertension: BP Readings from Last 3 Encounters:  10/31/21 130/82  07/02/21 126/89  08/21/20 120/80   Obesity: Wt Readings from Last 3 Encounters:  10/31/21 180 lb (81.6 kg)  08/21/20 159 lb (72.1 kg)  08/03/20 161 lb (73 kg)   BMI Readings from Last 3 Encounters:  10/31/21 30.90 kg/m  08/21/20 27.29 kg/m  08/03/20 27.64 kg/m     Vaccines:   Pneumonia: educated and discussed with patient. Flu: today COVID-19: discussed bivalent   Hep C Screening: 06/13/20 STD testing and prevention (HIV/chl/gon/syphilis): 06/16/12 Intimate partner violence: negative Sexual History :same partner, no pain  Menstrual History/LMP/Abnormal Bleeding:  hysterectomy  Incontinence Symptoms: no problems   Breast cancer:  - Last Mammogram: we will send an order  - BRCA gene screening:  maternal grandmother and two maternal great aunts had breast cancer   Osteoporosis prevention : Discussed high calcium and vitamin D supplementation, weight bearing exercises  Cervical cancer screening: N/A  Skin cancer: Discussed monitoring for atypical lesions  Colorectal cancer: N/A   Lung cancer: Low Dose CT Chest recommended if Age 42-80 years, 20 pack-year currently smoking OR have quit w/in 15years. Patient does not qualify.   ECG: 06/13/20  Advanced Care Planning: A voluntary discussion about advance care planning including the explanation and discussion of advance directives.  Discussed health care proxy and Living will, and the patient was able to identify a health care proxy as partner - discussed power of attorney of health care .    Lipids: Lab Results  Component Value Date   CHOL 208 (H) 04/14/2020   CHOL 192 07/24/2017   CHOL 220 (H) 05/04/2015   Lab Results  Component Value Date   HDL 61 04/14/2020   HDL 50 (L) 07/24/2017   HDL 56 05/04/2015   Lab Results  Component Value Date   LDLCALC 131 (H) 04/14/2020   LDLCALC 125 (H) 07/24/2017   LDLCALC 138 (H) 05/04/2015   Lab Results  Component Value Date   TRIG 90  04/14/2020   TRIG 78 07/24/2017   TRIG 132 05/04/2015   Lab Results  Component Value Date   CHOLHDL 3.4 04/14/2020   CHOLHDL 3.8 07/24/2017   CHOLHDL 3.9 05/04/2015   No results found for: LDLDIRECT  Glucose: Glucose  Date Value Ref Range Status  08/20/2013 86 65 - 99 mg/dL Final   Glucose, Bld  Date Value Ref Range Status  07/24/2017 85 65 - 99 mg/dL Final    Comment:    .            Fasting reference interval .   04/20/2015 96 65 - 99 mg/dL Final  02/24/2015 100 (H) 65 - 99 mg/dL Final    Patient Active Problem List   Diagnosis Date Noted   Noncompliance with medication regimen 01/16/2021    Alcohol abuse 01/16/2021   MDD (major depressive disorder), recurrent, severe, with psychosis ([place]) 04/11/2020   GAD (generalized anxiety disorder) 04/11/2020   Bereavement 04/11/2020   At risk for prolonged QT interval syndrome 04/11/2020   High risk medication use 04/11/2020   Anxiety 11/13/2017   Chronic constipation 08/12/2017   Hyperglycemia 05/04/2015   Tattoos 05/04/2015   Headache, migraine 04/29/2015   Excess weight 04/29/2015   Restless leg 04/29/2015   Snores 04/29/2015   Vitamin D deficiency 04/29/2015    Past Surgical History:  Procedure Laterality Date   ABDOMINAL HYSTERECTOMY     TUBAL LIGATION      Family History  Problem Relation Age of Onset   Heart disease Mother    Asthma Mother    Hypothyroidism Mother    Bipolar disorder Mother    Hypothyroidism Sister    Diabetes Sister    Hyperlipidemia Sister    Asthma Son    Heart disease Paternal Grandmother        Pacemaker   Leukemia Paternal Grandmother    Diabetes Paternal Grandmother    Bipolar disorder Maternal Aunt    Bipolar disorder Maternal Uncle     Social History   Socioeconomic History   Marital status: Single    Spouse name: Not on file   Number of children: 3   Years of education: Not on file   Highest education level: Not on file  Occupational History   Occupation: customer service specialist   Tobacco Use   Smoking status: Some Days    Packs/day: 0.25    Years: 4.00    Pack years: 1.00    Types: Cigarettes    Start date: 05/04/2011    Last attempt to quit: 07/21/2020    Years since quitting: 1.2   Smokeless tobacco: Never   Tobacco comments:    She recently joined a quit program at work, smoking at most 3 cigarettes per day now   Vaping Use   Vaping Use: Never used  Substance and Sexual Activity   Alcohol use: Yes    Alcohol/week: 0.0 standard drinks    Comment: occasionally   Drug use: No   Sexual activity: Yes    Partners: Male  Other Topics Concern   Not on file   Social History Narrative   Lives with same person / boyfriend for the past 18 years   She has three son. 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    Youngest sone died at age 50 from Leukemia    Social Determinants of Health   Financial Resource Strain: Low Risk    Difficulty  of Paying Living Expenses: Not hard at all  Food Insecurity: No Food Insecurity   Worried About [place] in the Last Year: Never true   [place] in the Last Year: Never true  Transportation Needs: No Transportation Needs   Lack of Transportation (Medical): No   Lack of Transportation (Non-Medical): No  Physical Activity: Insufficiently Active   Days of Exercise per Week: 2 days   Minutes of Exercise per Session: 20 min  Stress: Stress Concern Present   Feeling of Stress : Rather much  Social Connections: Moderately Isolated   Frequency of Communication with Friends and Family: Twice a week   Frequency of Social Gatherings with Friends and Family: Once a week   Attends Religious Services: Never   Marine scientist or Organizations: No   Attends Music therapist: Never   Marital Status: Living with partner  Intimate Partner Violence: Not At Risk   Fear of Current or Ex-Partner: No   Emotionally Abused: No   Physically Abused: No   Sexually Abused: No     Current Outpatient Medications:    ARIPiprazole (ABILIFY) 10 MG tablet, Take 1 tablet (10 mg total) by mouth daily. (Patient not taking: Reported on 10/31/2021), Disp: 90 tablet, Rfl: 0   clonazePAM (KLONOPIN) 1 MG tablet, Take 0.5-1 tablets (0.5-1 mg total) by mouth as directed. Start taking half tablet daily during the day as needed for anxiety attacks and one tablet at bedtime as needed for sleep and anxiety- please limit use (Patient not taking: Reported on 10/31/2021), Disp: 45 tablet, Rfl: 1   mirtazapine (REMERON) 15 MG tablet, Take 0.5-1 tablets (7.5-15 mg  total) by mouth at bedtime. Sleep and mood (Patient not taking: Reported on 10/31/2021), Disp: 90 tablet, Rfl: 0   Rimegepant Sulfate (NURTEC) 75 MG TBDP, Take 1 tablet by mouth every other day. (Patient not taking: Reported on 10/31/2021), Disp: 16 tablet, Rfl: 2   topiramate (TOPAMAX) 100 MG tablet, Take 1 tablet (100 mg total) by mouth 2 (two) times daily. Max of 100  Mg twice daily (Patient not taking: Reported on 10/31/2021), Disp: 180 tablet, Rfl: 0   venlafaxine XR (EFFEXOR XR) 75 MG 24 hr capsule, Take 1 capsule (75 mg total) by mouth daily with breakfast. (Patient not taking: Reported on 10/31/2021), Disp: 90 capsule, Rfl: 0   Vitamin D, Ergocalciferol, (DRISDOL) 1.25 MG (50000 UNIT) CAPS capsule, TAKE 1 CAPSULE (50,000 UNITS TOTAL) BY MOUTH EVERY 7 (SEVEN) DAYS. (NOT COVERED) (Patient not taking: Reported on 10/31/2021), Disp: 12 capsule, Rfl: 0  Allergies  Allergen Reactions   Corylus    Hydrocodone      ROS  Constitutional: Negative for fever, positive for weight change.  Respiratory: Negative for cough and shortness of breath.   Cardiovascular: Negative for chest pain or palpitations.  Gastrointestinal: Negative for abdominal pain, no bowel changes.  Musculoskeletal: Negative for gait problem or joint swelling.  Skin: Negative for rash.  Neurological: Negative for dizziness, positive for intermittent headache.  No other specific complaints in a complete review of systems (except as listed in HPI above).   Objective  Vitals:   10/31/21 1345  BP: 130/82  Pulse: 82  Resp: 16  Temp: 98.1 F (36.7 C)  Weight: 180 lb (81.6 kg)  Height: '5\' 4"'  (1.626 m)    Body mass index is 30.9 kg/m.  Physical Exam   Constitutional: Patient appears well-developed and well-nourished. No distress.  HENT: Head: Normocephalic and atraumatic.  Ears: B TMs ok, no erythema or effusion; Nose: Not done Mouth/Throat: not done  Eyes: Conjunctivae and EOM are normal. Pupils are equal, round, and  reactive to light. No scleral icterus.  Neck: Normal range of motion. Neck supple. No JVD present. No thyromegaly present.  Cardiovascular: Normal rate, regular rhythm and normal heart sounds.  No murmur heard. No BLE edema. Pulmonary/Chest: Effort normal and breath sounds normal. No respiratory distress. Abdominal: Soft. Bowel sounds are normal, no distension. There is no tenderness. no masses Breast: no lumps or masses, no nipple discharge or rashes FEMALE GENITALIA:  Not done  RECTAL:not done  Musculoskeletal: Normal range of motion, no joint effusions. No gross deformities Neurological: he is alert and oriented to person, place, and time. No cranial nerve deficit. Coordination, balance, strength, speech and gait are normal.  Skin: Skin is warm and dry. No rash noted. No erythema.  Psychiatric: Patient has a normal mood and affect. behavior is normal. Judgment and thought content normal.    Fall Risk: Fall Risk  10/31/2021 08/21/2020 08/03/2020 06/13/2020 03/15/2020  Falls in the past year? '1 1 1 1 ' 0  Number falls in past yr: '1 1 1 1 ' 0  Injury with Fall? '1 1 1 1 ' 0  Risk for fall due to : No Fall Risks History of fall(s) History of fall(s) - -  Follow up Falls prevention discussed - - - -     Functional Status Survey: Is the patient deaf or have difficulty hearing?: No Does the patient have difficulty seeing, even when wearing glasses/contacts?: No Does the patient have difficulty concentrating, remembering, or making decisions?: Yes Does the patient have difficulty walking or climbing stairs?: No Does the patient have difficulty dressing or bathing?: No Does the patient have difficulty doing errands alone such as visiting a doctor's office or shopping?: No   Assessment & Plan  1. Well adult exam  - Flu Vaccine QUAD 6+ mos PF IM (Fluarix Quad PF) - Comprehensive metabolic panel - TSH - Vitamin B12 - CBC with Differential/Platelet - Hemoglobin A1c - Lipid panel - VITAMIN D  25 Hydroxy (Vit-D Deficiency, Fractures)  2. GAD (generalized anxiety disorder)  - Ambulatory referral to Psychiatry  3. Long-term use of high-risk medication  - Hemoglobin A1c  4. MDD (major depressive disorder), recurrent, severe, with psychosis ([place])  - Ambulatory referral to Psychiatry  5. Vitamin D deficiency  - VITAMIN D 25 Hydroxy (Vit-D Deficiency, Fractures)  6. Diabetes mellitus screening   7. Screening for deficiency anemia  - CBC with Differential/Platelet  8. Lipid screening  - Lipid panel  9. Needs flu shot  - Flu Vaccine QUAD 6+ mos PF IM (Fluarix Quad PF)   -USPSTF grade A and B recommendations reviewed with patient; age-appropriate recommendations, preventive care, screening tests, etc discussed and encouraged; healthy living encouraged; see AVS for patient education given to patient -Discussed importance of 150 minutes of physical activity weekly, eat two servings of fish weekly, eat one serving of tree nuts ( cashews, pistachios, pecans, almonds.Marland Kitchen) every other day, eat 6 servings of fruit/vegetables daily and drink plenty of water and avoid sweet beverages.

## 2021-10-31 NOTE — Patient Instructions (Signed)

## 2021-10-31 NOTE — Telephone Encounter (Signed)
inbasket workque received referral. do you want to take pt back?

## 2021-10-31 NOTE — Telephone Encounter (Signed)
She was very noncompliant with recommendations and appointments-I can give her another chance-however please advise her about clinic policy, no show policy, compliance with recommendations.  And please have her come IN PERSON for appointments.

## 2021-11-01 LAB — LIPID PANEL
Chol/HDL Ratio: 3.5 ratio (ref 0.0–4.4)
Cholesterol, Total: 231 mg/dL — ABNORMAL HIGH (ref 100–199)
HDL: 66 mg/dL (ref >39)
LDL Chol Calc (NIH): 150 mg/dL — ABNORMAL HIGH (ref 0–99)
Triglycerides: 87 mg/dL (ref 0–149)
VLDL Cholesterol Cal: 15 mg/dL (ref 5–40)

## 2021-11-01 LAB — CBC WITH DIFFERENTIAL/PLATELET
Basophils Absolute: 0 10*3/uL (ref 0.0–0.2)
Basos: 0 %
EOS (ABSOLUTE): 0.1 10*3/uL (ref 0.0–0.4)
Eos: 2 %
Hematocrit: 41.7 % (ref 34.0–46.6)
Hemoglobin: 14.2 g/dL (ref 11.1–15.9)
Immature Grans (Abs): 0 10*3/uL (ref 0.0–0.1)
Immature Granulocytes: 0 %
Lymphocytes Absolute: 2.1 10*3/uL (ref 0.7–3.1)
Lymphs: 29 %
MCH: 31.6 pg (ref 26.6–33.0)
MCHC: 34.1 g/dL (ref 31.5–35.7)
MCV: 93 fL (ref 79–97)
Monocytes Absolute: 0.3 10*3/uL (ref 0.1–0.9)
Monocytes: 4 %
Neutrophils Absolute: 4.5 10*3/uL (ref 1.4–7.0)
Neutrophils: 65 %
Platelets: 271 10*3/uL (ref 150–450)
RBC: 4.49 x10E6/uL (ref 3.77–5.28)
RDW: 12.1 % (ref 11.7–15.4)
WBC: 7 10*3/uL (ref 3.4–10.8)

## 2021-11-01 LAB — COMPREHENSIVE METABOLIC PANEL
ALT: 9 IU/L (ref 0–32)
AST: 11 IU/L (ref 0–40)
Albumin/Globulin Ratio: 1.6 (ref 1.2–2.2)
Albumin: 4.5 g/dL (ref 3.8–4.8)
Alkaline Phosphatase: 51 IU/L (ref 44–121)
BUN/Creatinine Ratio: 8 — ABNORMAL LOW (ref 9–23)
BUN: 7 mg/dL (ref 6–24)
Bilirubin Total: 0.2 mg/dL (ref 0.0–1.2)
CO2: 23 mmol/L (ref 20–29)
Calcium: 9.2 mg/dL (ref 8.7–10.2)
Chloride: 105 mmol/L (ref 96–106)
Creatinine, Ser: 0.84 mg/dL (ref 0.57–1.00)
Globulin, Total: 2.9 g/dL (ref 1.5–4.5)
Glucose: 102 mg/dL — ABNORMAL HIGH (ref 70–99)
Potassium: 3.9 mmol/L (ref 3.5–5.2)
Sodium: 142 mmol/L (ref 134–144)
Total Protein: 7.4 g/dL (ref 6.0–8.5)
eGFR: 90 mL/min/{1.73_m2} (ref >59)

## 2021-11-01 LAB — HEMOGLOBIN A1C
Est. average glucose Bld gHb Est-mCnc: 111 mg/dL
Hgb A1c MFr Bld: 5.5 % (ref 4.8–5.6)

## 2021-11-01 LAB — VITAMIN B12: Vitamin B-12: 397 pg/mL (ref 232–1245)

## 2021-11-01 LAB — TSH: TSH: 1.71 u[IU]/mL (ref 0.450–4.500)

## 2021-11-01 LAB — VITAMIN D 25 HYDROXY (VIT D DEFICIENCY, FRACTURES): Vit D, 25-Hydroxy: 18.8 ng/mL — ABNORMAL LOW (ref 30.0–100.0)

## 2021-11-01 NOTE — Progress Notes (Signed)
Name: Virginia Walls   MRN: [id number]    DOB: Mar 04, 1981   Date:11/05/2021       Progress Note  Subjective  Chief Complaint  Migraine/Weight  HPI  Migraine: she states pain can start on nuchal area or frontal area and radiates to opposite direction. Described as pounding, sharp associated with dizziness, photophobia, no nausea or vomiting. 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 She has noticed increase in migraine episodes since, she is taking Excedrin migraine almost daily again, having to nap during the day. She responded Topamax and Nurteq prn but has bene out of medication, she would like to resume medication. She states she tried triptans in the past but unable to tolerate, she thinks it was Sumatriptan    Vitamin D def: her level has been low for a long time, explained she needs to take vitamin D 2000 units forever   Low B12: discussed towards low end of normal, advised B12 SL three times a week.   Insulin resistance: A1C used to be high at 5.7 %, currently normal, but had elevated fasting glucose discussed low carb diet, and consider medication like Metformin. She is willing to try it.   Dyslipidemia: LDL went up, discussed healthier diet, avoid fast food, eat more fruit , vegetables and fish.   The 10-year ASCVD risk score (Arnett DK, et al., 2019) is: 0.9%   Values used to calculate the score:     Age: 41 years     Sex: Female     Is Non-Hispanic African American: Yes     Diabetic: No     Tobacco smoker: Yes     Systolic Blood Pressure: 580 mmHg     Is BP treated: No     HDL Cholesterol: 66 mg/dL     Total Cholesterol: 231 mg/dL   Grief/Major depression recurrent: she is feeling better, still has bad days, she would like to resume visits to psychiatrist and therapist, she has been off all medications. Phq 9 was reviewed    Patient Active Problem List   Diagnosis Date Noted   Noncompliance with medication regimen 01/16/2021    Alcohol abuse 01/16/2021   MDD (major depressive disorder), recurrent, severe, with psychosis ([place]) 04/11/2020   GAD (generalized anxiety disorder) 04/11/2020   Bereavement 04/11/2020   At risk for prolonged QT interval syndrome 04/11/2020   High risk medication use 04/11/2020   Anxiety 11/13/2017   Chronic constipation 08/12/2017   Hyperglycemia 05/04/2015   Tattoos 05/04/2015   Headache, migraine 04/29/2015   Excess weight 04/29/2015   Restless leg 04/29/2015   Snores 04/29/2015   Vitamin D deficiency 04/29/2015    Past Surgical History:  Procedure Laterality Date   ABDOMINAL HYSTERECTOMY     TUBAL LIGATION      Family History  Problem Relation Age of Onset   Heart disease Mother    Asthma Mother    Hypothyroidism Mother    Bipolar disorder Mother    Hypothyroidism Sister    Diabetes Sister    Hyperlipidemia Sister    Asthma Son    Heart disease Paternal Grandmother        Pacemaker   Leukemia Paternal Grandmother    Diabetes Paternal Grandmother    Bipolar disorder Maternal Aunt    Bipolar disorder Maternal Uncle     Social History   Tobacco Use   Smoking status: Some Days    Packs/day: 0.25  Years: 4.00    Pack years: 1.00    Types: Cigarettes    Start date: 05/04/2011    Last attempt to quit: 07/21/2020    Years since quitting: 1.2   Smokeless tobacco: Never   Tobacco comments:    She recently joined a quit program at work, smoking at most 3 cigarettes per day now   Substance Use Topics   Alcohol use: Yes    Alcohol/week: 0.0 standard drinks    Comment: occasionally     Current Outpatient Medications:    ARIPiprazole (ABILIFY) 10 MG tablet, Take 1 tablet (10 mg total) by mouth daily. (Patient not taking: Reported on 10/31/2021), Disp: 90 tablet, Rfl: 0   clonazePAM (KLONOPIN) 1 MG tablet, Take 0.5-1 tablets (0.5-1 mg total) by mouth as directed. Start taking half tablet daily during the day as needed for anxiety attacks and one tablet at bedtime as  needed for sleep and anxiety- please limit use (Patient not taking: Reported on 10/31/2021), Disp: 45 tablet, Rfl: 1   mirtazapine (REMERON) 15 MG tablet, Take 0.5-1 tablets (7.5-15 mg total) by mouth at bedtime. Sleep and mood (Patient not taking: Reported on 10/31/2021), Disp: 90 tablet, Rfl: 0   Rimegepant Sulfate (NURTEC) 75 MG TBDP, Take 1 tablet by mouth every other day. (Patient not taking: Reported on 10/31/2021), Disp: 16 tablet, Rfl: 2   topiramate (TOPAMAX) 100 MG tablet, Take 1 tablet (100 mg total) by mouth 2 (two) times daily. Max of 100  Mg twice daily (Patient not taking: Reported on 10/31/2021), Disp: 180 tablet, Rfl: 0   venlafaxine XR (EFFEXOR XR) 75 MG 24 hr capsule, Take 1 capsule (75 mg total) by mouth daily with breakfast. (Patient not taking: Reported on 10/31/2021), Disp: 90 capsule, Rfl: 0   Vitamin D, Ergocalciferol, (DRISDOL) 1.25 MG (50000 UNIT) CAPS capsule, TAKE 1 CAPSULE (50,000 UNITS TOTAL) BY MOUTH EVERY 7 (SEVEN) DAYS. (NOT COVERED) (Patient not taking: Reported on 10/31/2021), Disp: 12 capsule, Rfl: 0  Allergies  Allergen Reactions   Corylus    Hydrocodone     I personally reviewed active problem list, medication list, allergies, family history, social history, health maintenance with the patient/caregiver today.   ROS  Constitutional: Negative for fever or weight change.  Respiratory: Negative for cough and shortness of breath.   Cardiovascular: Negative for chest pain or palpitations.  Gastrointestinal: Negative for abdominal pain, no bowel changes.  Musculoskeletal: Negative for gait problem or joint swelling.  Skin: Negative for rash.  Neurological: Negative for dizziness , positive for  headache.  No other specific complaints in a complete review of systems (except as listed in HPI above).   Objective  Vitals:   11/05/21 1546  BP: 130/70  Pulse: 82  Resp: 16  Weight: 179 lb (81.2 kg)  Height: _0  (1.6 m)    Body mass index is 31.71  kg/m.  Physical Exam  Constitutional: Patient appears well-developed and well-nourished. Obese  No distress.  HEENT: head atraumatic, normocephalic, pupils equal and reactive to light, neck supple, throat within normal limits Cardiovascular: Normal rate, regular rhythm and normal heart sounds.  No murmur heard. No BLE edema. Pulmonary/Chest: Effort normal and breath sounds normal. No respiratory distress. Abdominal: Soft.  There is no tenderness. Psychiatric: Patient has a normal mood and affect. behavior is normal. Judgment and thought content normal.   Recent Results (from the past 2160 hour(s))  Comprehensive metabolic panel     Status: Abnormal   Collection Time: 10/31/21  3:22  PM  Result Value Ref Range   Glucose 102 (H) 70 - 99 mg/dL   BUN 7 6 - 24 mg/dL   Creatinine, Ser 0.84 0.57 - 1.00 mg/dL   eGFR 90 >59 mL/min/1.73   BUN/Creatinine Ratio 8 (L) 9 - 23   Sodium 142 134 - 144 mmol/L   Potassium 3.9 3.5 - 5.2 mmol/L   Chloride 105 96 - 106 mmol/L   CO2 23 20 - 29 mmol/L   Calcium 9.2 8.7 - 10.2 mg/dL   Total Protein 7.4 6.0 - 8.5 g/dL   Albumin 4.5 3.8 - 4.8 g/dL   Globulin, Total 2.9 1.5 - 4.5 g/dL   Albumin/Globulin Ratio 1.6 1.2 - 2.2   Bilirubin Total 0.2 0.0 - 1.2 mg/dL   Alkaline Phosphatase 51 44 - 121 IU/L   AST 11 0 - 40 IU/L   ALT 9 0 - 32 IU/L  TSH     Status: None   Collection Time: 10/31/21  3:22 PM  Result Value Ref Range   TSH 1.710 0.450 - 4.500 uIU/mL  Vitamin B12     Status: None   Collection Time: 10/31/21  3:22 PM  Result Value Ref Range   Vitamin B-12 397 232 - 1,245 pg/mL  CBC with Differential/Platelet     Status: None   Collection Time: 10/31/21  3:22 PM  Result Value Ref Range   WBC 7.0 3.4 - 10.8 x10E3/uL   RBC 4.49 3.77 - 5.28 x10E6/uL   Hemoglobin 14.2 11.1 - 15.9 g/dL   Hematocrit 41.7 34.0 - 46.6 %   MCV 93 79 - 97 fL   MCH 31.6 26.6 - 33.0 pg   MCHC 34.1 31.5 - 35.7 g/dL   RDW 12.1 11.7 - 15.4 %   Platelets 271 150 - 450  x10E3/uL   Neutrophils 65 Not Estab. %   Lymphs 29 Not Estab. %   Monocytes 4 Not Estab. %   Eos 2 Not Estab. %   Basos 0 Not Estab. %   Neutrophils Absolute 4.5 1.4 - 7.0 x10E3/uL   Lymphocytes Absolute 2.1 0.7 - 3.1 x10E3/uL   Monocytes Absolute 0.3 0.1 - 0.9 x10E3/uL   EOS (ABSOLUTE) 0.1 0.0 - 0.4 x10E3/uL   Basophils Absolute 0.0 0.0 - 0.2 x10E3/uL   Immature Granulocytes 0 Not Estab. %   Immature Grans (Abs) 0.0 0.0 - 0.1 x10E3/uL  Hemoglobin A1c     Status: None   Collection Time: 10/31/21  3:22 PM  Result Value Ref Range   Hgb A1c MFr Bld 5.5 4.8 - 5.6 %    Comment:          Prediabetes: 5.7 - 6.4          Diabetes: >6.4          Glycemic control for adults with diabetes: <7.0    Est. average glucose Bld gHb Est-mCnc 111 mg/dL  Lipid panel     Status: Abnormal   Collection Time: 10/31/21  3:22 PM  Result Value Ref Range   Cholesterol, Total 231 (H) 100 - 199 mg/dL   Triglycerides 87 0 - 149 mg/dL   HDL 66 >39 mg/dL   VLDL Cholesterol Cal 15 5 - 40 mg/dL   LDL Chol Calc (NIH) 150 (H) 0 - 99 mg/dL   Chol/HDL Ratio 3.5 0.0 - 4.4 ratio    Comment:  T. Chol/HDL Ratio                                             Men  Women                               1/2 Avg.Risk  3.4    3.3                                   Avg.Risk  5.0    4.4                                2X Avg.Risk  9.6    7.1                                3X Avg.Risk 23.4   11.0   VITAMIN D 25 Hydroxy (Vit-D Deficiency, Fractures)     Status: Abnormal   Collection Time: 10/31/21  3:22 PM  Result Value Ref Range   Vit D, 25-Hydroxy 18.8 (L) 30.0 - 100.0 ng/mL    Comment: Vitamin D deficiency has been defined by the [place] practice guideline as a level of serum 25-OH vitamin D less than 20 ng/mL (1,2). The Endocrine Society went on to further define vitamin D insufficiency as a level between 21 and 29 ng/mL (2). 1. IOM (Institute of  Medicine). 2010. Dietary reference    intakes for calcium and D. [place]: The    [employer]. 2. Holick MF, Binkley [place], Bischoff-Ferrari HA, et al.    Evaluation, treatment, and prevention of vitamin D    deficiency: an Endocrine Society clinical practice    guideline. JCEM. 2011 Jul; 96(7):1911-30.     PHQ2/9: Depression screen [facility] 2/9 11/05/2021 10/31/2021 08/21/2020 08/03/2020 06/13/2020  Decreased Interest _0 Down, Depressed, Hopeless _1 PHQ - 2 Score _2 Altered sleeping _3 Tired, decreased energy _4 Change in appetite 0 0 _5 Feeling bad or failure about yourself  0 0 _6 Trouble concentrating _7 Moving slowly or fidgety/restless 0 0 _8 Suicidal thoughts 0 0 0 1 0  PHQ-9 Score _9 Difficult doing work/chores - - Somewhat difficult Very difficult Somewhat difficult  Some encounter information is confidential and restricted. Go to Review Flowsheets activity to see all data.  Some recent data might be hidden    phq 9 is positive   Fall Risk: Fall Risk  11/05/2021 10/31/2021 08/21/2020 08/03/2020 06/13/2020  Falls in the past year? _10 Number falls in past yr: _11 Injury with Fall? 0 _12 Risk for fall due to : No Fall Risks No Fall Risks History of fall(s) History of fall(s) -  Follow up Falls prevention discussed Falls prevention discussed - - -      Functional Status Survey:  Is the patient deaf or have difficulty hearing?: No Does the patient have difficulty seeing, even when wearing glasses/contacts?: No Does the patient have difficulty concentrating, remembering, or making decisions?: Yes Does the patient have difficulty walking or climbing stairs?: No Does the patient have difficulty dressing or bathing?: No Does the patient have difficulty doing errands alone such as visiting a doctor's office or shopping?: No    Assessment & Plan  1. Insulin resistance  -  metFORMIN (GLUCOPHAGE-XR) 500 MG 24 hr tablet; Take 1 tablet (500 mg total) by mouth daily with breakfast.  Dispense: 90 tablet; Refill: 0  2. Dyslipidemia   3. MDD (major depressive disorder), recurrent, severe, with psychosis ([place])  Waiting to hear from Dr. Shea Evans   4. Vitamin D deficiency  - Vitamin D, Ergocalciferol, (DRISDOL) 1.25 MG (50000 UNIT) CAPS capsule; TAKE 1 CAPSULE (50,000 UNITS TOTAL) BY MOUTH EVERY 7 (SEVEN) DAYS. (NOT COVERED)  Dispense: 12 capsule; Refill: 0  5. Low serum vitamin B12  Advised otc supplementation   6. Migraine without aura and without status migrainosus, not intractable  - topiramate (TOPAMAX) 50 MG tablet; Take 1-2 tablets (50-100 mg total) by mouth at bedtime as needed.  Dispense: 60 tablet; Refill: 0 - Rimegepant Sulfate (NURTEC) 75 MG TBDP; Take 1 tablet by mouth daily as needed. Max of 2 in 24 hours  Dispense: 16 tablet; Refill: 2

## 2021-11-05 ENCOUNTER — Ambulatory Visit (INDEPENDENT_AMBULATORY_CARE_PROVIDER_SITE_OTHER): Payer: Managed Care, Other (non HMO) | Admitting: Family Medicine

## 2021-11-05 ENCOUNTER — Encounter: Payer: Self-pay | Admitting: Family Medicine

## 2021-11-05 ENCOUNTER — Other Ambulatory Visit: Payer: Self-pay | Admitting: Family Medicine

## 2021-11-05 VITALS — BP 130/70 | HR 82 | Resp 16 | Ht 63.0 in | Wt 179.0 lb

## 2021-11-05 DIAGNOSIS — E538 Deficiency of other specified B group vitamins: Secondary | ICD-10-CM

## 2021-11-05 DIAGNOSIS — G43009 Migraine without aura, not intractable, without status migrainosus: Secondary | ICD-10-CM

## 2021-11-05 DIAGNOSIS — E559 Vitamin D deficiency, unspecified: Secondary | ICD-10-CM

## 2021-11-05 DIAGNOSIS — E785 Hyperlipidemia, unspecified: Secondary | ICD-10-CM

## 2021-11-05 DIAGNOSIS — E8881 Metabolic syndrome: Secondary | ICD-10-CM

## 2021-11-05 DIAGNOSIS — F333 Major depressive disorder, recurrent, severe with psychotic symptoms: Secondary | ICD-10-CM

## 2021-11-05 MED ORDER — NURTEC 75 MG PO TBDP: 1.0000 | ORAL_TABLET | Freq: Every day | ORAL | 2 refills | Status: DC | PRN

## 2021-11-05 MED ORDER — VITAMIN D (ERGOCALCIFEROL) 1.25 MG (50000 UNIT) PO CAPS: ORAL_CAPSULE | ORAL | 0 refills | Status: DC

## 2021-11-05 MED ORDER — METFORMIN HCL ER 500 MG PO TB24: 500.0000 mg | ORAL_TABLET | Freq: Every day | ORAL | 0 refills | Status: DC

## 2021-11-05 MED ORDER — TOPIRAMATE 50 MG PO TABS: 50.0000 mg | ORAL_TABLET | Freq: Every evening | ORAL | 0 refills | Status: DC | PRN

## 2021-11-05 MED ORDER — B-12 1000 MCG SL SUBL: 1.0000 | SUBLINGUAL_TABLET | SUBLINGUAL | 0 refills | Status: DC

## 2021-11-06 ENCOUNTER — Other Ambulatory Visit: Payer: Self-pay

## 2021-11-06 DIAGNOSIS — G43009 Migraine without aura, not intractable, without status migrainosus: Secondary | ICD-10-CM

## 2021-11-06 MED ORDER — NURTEC 75 MG PO TBDP: 1.0000 | ORAL_TABLET | Freq: Every day | ORAL | 2 refills | Status: DC | PRN

## 2021-11-13 NOTE — Telephone Encounter (Signed)
Called and left pt a message to call office  Noted the referral notes also

## 2021-12-06 ENCOUNTER — Other Ambulatory Visit: Payer: Self-pay | Admitting: Family Medicine

## 2021-12-06 DIAGNOSIS — G43009 Migraine without aura, not intractable, without status migrainosus: Secondary | ICD-10-CM

## 2021-12-06 NOTE — Telephone Encounter (Signed)
Requested Prescriptions  Pending Prescriptions Disp Refills   topiramate (TOPAMAX) 50 MG tablet [Pharmacy Med Name: TOPIRAMATE 50MG  TABLETS] 180 tablet 1    Sig: TAKE 1 TO 2 TABLETS(50 TO 100 MG) BY MOUTH AT BEDTIME AS NEEDED     Neurology: Anticonvulsants - topiramate & zonisamide Passed - 12/06/2021  3:30 AM      Passed - Cr in normal range and within 360 days    Creat  Date Value Ref Range Status  07/24/2017 0.74 0.50 - 1.10 mg/dL Final   Creatinine, Ser  Date Value Ref Range Status  10/31/2021 0.84 0.57 - 1.00 mg/dL Final         Passed - CO2 in normal range and within 360 days    CO2  Date Value Ref Range Status  10/31/2021 23 20 - 29 mmol/L Final   Co2  Date Value Ref Range Status  08/20/2013 28 21 - 32 mmol/L Final         Passed - ALT in normal range and within 360 days    ALT  Date Value Ref Range Status  10/31/2021 9 0 - 32 IU/L Final         Passed - AST in normal range and within 360 days    AST  Date Value Ref Range Status  10/31/2021 11 0 - 40 IU/L Final         Passed - Completed PHQ-2 or PHQ-9 in the last 360 days      Passed - Valid encounter within last 12 months    Recent Outpatient Visits          1 month ago Insulin resistance   [facility] Steele Sizer, MD   1 month ago Well adult exam   [facility] Steele Sizer, MD   1 year ago MDD (major depressive disorder), recurrent, severe, with psychosis [facility])   [facility] Steele Sizer, MD   1 year ago MDD (major depressive disorder), recurrent, severe, with psychosis [facility])   [facility] Steele Sizer, MD   1 year ago MDD (major depressive disorder), recurrent, severe, with psychosis [facility])   [facility] Steele Sizer, MD      Future Appointments            In 3 weeks Steele Sizer, MD [facility], [place]   In 11 months Steele Sizer, MD [facility], [facility]

## 2021-12-25 ENCOUNTER — Encounter: Payer: Self-pay | Admitting: Psychiatry

## 2021-12-26 NOTE — Progress Notes (Signed)
Name: Virginia Walls   MRN: [id number]    DOB: [birth year]   Date:12/27/2021       Progress Note  Subjective  Chief Complaint  Swollen Pinky- Right  HPI  Pain finger: she states two months ago she noticed some pain on MCP on 4th middle finger while at work, that resolved by itself but shortly after developed pain and swelling of right fifth finger  while at work. She types all day. No redness or increase in warmth but tender to touch and with movement. No other joints involved at this time. Unsure if family history of rheumatological diseases..  Patient Active Problem List   Diagnosis Date Noted   Noncompliance with medication regimen 01/16/2021   Alcohol abuse 01/16/2021   MDD (major depressive disorder), recurrent, severe, with psychosis ([place]) 04/11/2020   GAD (generalized anxiety disorder) 04/11/2020   Bereavement 04/11/2020   At risk for prolonged QT interval syndrome 04/11/2020   High risk medication use 04/11/2020   Anxiety 11/13/2017   Chronic constipation 08/12/2017   Hyperglycemia 05/04/2015   Tattoos 05/04/2015   Headache, migraine 04/29/2015   Excess weight 04/29/2015   Restless leg 04/29/2015   Snores 04/29/2015   Vitamin D deficiency 04/29/2015    Past Surgical History:  Procedure Laterality Date   ABDOMINAL HYSTERECTOMY     TUBAL LIGATION      Family History  Problem Relation Age of Onset   Heart disease Mother    Asthma Mother    Hypothyroidism Mother    Bipolar disorder Mother    Hypothyroidism Sister    Diabetes Sister    Hyperlipidemia Sister    Asthma Son    Heart disease Paternal Grandmother        Pacemaker   Leukemia Paternal Grandmother    Diabetes Paternal Grandmother    Bipolar disorder Maternal Aunt    Bipolar disorder Maternal Uncle     Social History   Tobacco Use   Smoking status: Some Days    Packs/day: 0.25    Years: 4.00    Pack years: 1.00    Types: Cigarettes    Start date: 05/04/2011    Last attempt to quit: 07/21/2020     Years since quitting: 1.4   Smokeless tobacco: Never   Tobacco comments:    She recently joined a quit program at work, smoking at most 3 cigarettes per day now   Substance Use Topics   Alcohol use: Yes    Alcohol/week: 0.0 standard drinks    Comment: occasionally     Current Outpatient Medications:    Cyanocobalamin (B-12) 1000 MCG SUBL, Place 1 tablet under the tongue 3 (three) times a week., Disp: 30 tablet, Rfl: 0   metFORMIN (GLUCOPHAGE-XR) 500 MG 24 hr tablet, Take 1 tablet (500 mg total) by mouth daily with breakfast., Disp: 90 tablet, Rfl: 0   Rimegepant Sulfate (NURTEC) 75 MG TBDP, Take 1 tablet by mouth daily as needed. Max of 2 in 24 hours, Disp: 16 tablet, Rfl: 2   topiramate (TOPAMAX) 50 MG tablet, TAKE 1 TO 2 TABLETS(50 TO 100 MG) BY MOUTH AT BEDTIME AS NEEDED, Disp: 180 tablet, Rfl: 1   Vitamin D, Ergocalciferol, (DRISDOL) 1.25 MG (50000 UNIT) CAPS capsule, TAKE 1 CAPSULE (50,000 UNITS TOTAL) BY MOUTH EVERY 7 (SEVEN) DAYS. (NOT COVERED), Disp: 12 capsule, Rfl: 0  Allergies  Allergen Reactions   Corylus    Hydrocodone     I personally reviewed active problem list, medication list, allergies, family history,  social history, health maintenance with the patient/caregiver today.   ROS  Ten systems reviewed and is negative except as mentioned in HPI   Objective  Vitals:   12/27/21 1055  BP: 130/84  Pulse: 84  Resp: 16  SpO2: 100%  Weight: 174 lb (78.9 kg)  Height: '5\' 3"'  (1.6 m)    Body mass index is 30.82 kg/m.  Physical Exam  Constitutional: Patient appears well-developed and well-nourished. Obese  No distress.  HEENT: head atraumatic, normocephalic, pupils equal and reactive to light, neck supple Cardiovascular: Normal rate, regular rhythm and normal heart sounds.  No murmur heard. No BLE edema. Pulmonary/Chest: Effort normal and breath sounds normal. No respiratory distress. Abdominal: Soft.  There is no tenderness. Muscular skeletal: right fifth  finger is tender, swollen on the PIP joint , pain with movement and touch  Psychiatric: Patient has a normal mood and affect. behavior is normal. Judgment and thought content normal.   Recent Results (from the past 2160 hour(s))  Comprehensive metabolic panel     Status: Abnormal   Collection Time: 10/31/21  3:22 PM  Result Value Ref Range   Glucose 102 (H) 70 - 99 mg/dL   BUN 7 6 - 24 mg/dL   Creatinine, Ser 0.84 0.57 - 1.00 mg/dL   eGFR 90 >59 mL/min/1.73   BUN/Creatinine Ratio 8 (L) 9 - 23   Sodium 142 134 - 144 mmol/L   Potassium 3.9 3.5 - 5.2 mmol/L   Chloride 105 96 - 106 mmol/L   CO2 23 20 - 29 mmol/L   Calcium 9.2 8.7 - 10.2 mg/dL   Total Protein 7.4 6.0 - 8.5 g/dL   Albumin 4.5 3.8 - 4.8 g/dL   Globulin, Total 2.9 1.5 - 4.5 g/dL   Albumin/Globulin Ratio 1.6 1.2 - 2.2   Bilirubin Total 0.2 0.0 - 1.2 mg/dL   Alkaline Phosphatase 51 44 - 121 IU/L   AST 11 0 - 40 IU/L   ALT 9 0 - 32 IU/L  TSH     Status: None   Collection Time: 10/31/21  3:22 PM  Result Value Ref Range   TSH 1.710 0.450 - 4.500 uIU/mL  Vitamin B12     Status: None   Collection Time: 10/31/21  3:22 PM  Result Value Ref Range   Vitamin B-12 397 232 - 1,245 pg/mL  CBC with Differential/Platelet     Status: None   Collection Time: 10/31/21  3:22 PM  Result Value Ref Range   WBC 7.0 3.4 - 10.8 x10E3/uL   RBC 4.49 3.77 - 5.28 x10E6/uL   Hemoglobin 14.2 11.1 - 15.9 g/dL   Hematocrit 41.7 34.0 - 46.6 %   MCV 93 79 - 97 fL   MCH 31.6 26.6 - 33.0 pg   MCHC 34.1 31.5 - 35.7 g/dL   RDW 12.1 11.7 - 15.4 %   Platelets 271 150 - 450 x10E3/uL   Neutrophils 65 Not Estab. %   Lymphs 29 Not Estab. %   Monocytes 4 Not Estab. %   Eos 2 Not Estab. %   Basos 0 Not Estab. %   Neutrophils Absolute 4.5 1.4 - 7.0 x10E3/uL   Lymphocytes Absolute 2.1 0.7 - 3.1 x10E3/uL   Monocytes Absolute 0.3 0.1 - 0.9 x10E3/uL   EOS (ABSOLUTE) 0.1 0.0 - 0.4 x10E3/uL   Basophils Absolute 0.0 0.0 - 0.2 x10E3/uL   Immature Granulocytes  0 Not Estab. %   Immature Grans (Abs) 0.0 0.0 - 0.1 x10E3/uL  Hemoglobin A1c  Status: None   Collection Time: 10/31/21  3:22 PM  Result Value Ref Range   Hgb A1c MFr Bld 5.5 4.8 - 5.6 %    Comment:          Prediabetes: 5.7 - 6.4          Diabetes: >6.4          Glycemic control for adults with diabetes: <7.0    Est. average glucose Bld gHb Est-mCnc 111 mg/dL  Lipid panel     Status: Abnormal   Collection Time: 10/31/21  3:22 PM  Result Value Ref Range   Cholesterol, Total 231 (H) 100 - 199 mg/dL   Triglycerides 87 0 - 149 mg/dL   HDL 66 >39 mg/dL   VLDL Cholesterol Cal 15 5 - 40 mg/dL   LDL Chol Calc (NIH) 150 (H) 0 - 99 mg/dL   Chol/HDL Ratio 3.5 0.0 - 4.4 ratio    Comment:                                   T. Chol/HDL Ratio                                             Men  Women                               1/2 Avg.Risk  3.4    3.3                                   Avg.Risk  5.0    4.4                                2X Avg.Risk  9.6    7.1                                3X Avg.Risk 23.4   11.0   VITAMIN D 25 Hydroxy (Vit-D Deficiency, Fractures)     Status: Abnormal   Collection Time: 10/31/21  3:22 PM  Result Value Ref Range   Vit D, 25-Hydroxy 18.8 (L) 30.0 - 100.0 ng/mL    Comment: Vitamin D deficiency has been defined by the [place] practice guideline as a level of serum 25-OH vitamin D less than 20 ng/mL (1,2). The Endocrine Society went on to further define vitamin D insufficiency as a level between 21 and 29 ng/mL (2). 1. IOM (Institute of Medicine). 2010. Dietary reference    intakes for calcium and D. [employer]: The    [employer]. 2. Holick MF, Binkley [place], Bischoff-Ferrari HA, et al.    Evaluation, treatment, and prevention of vitamin D    deficiency: an Endocrine Society clinical practice    guideline. JCEM. 2011 Jul; 96(7):1911-30.     PHQ2/9: Depression screen [facility] 2/9 12/27/2021 11/05/2021 10/31/2021  08/21/2020 08/03/2020  Decreased Interest '1 2 2 2 2  ' Down, Depressed, Hopeless '1 2 2 3 2  ' PHQ - 2 Score '2 4 4 5 4  ' Altered sleeping  0 '3 3 2 2  ' Tired, decreased energy 0 '3 3 2 2  ' Change in appetite 2 0 0 2 2  Feeling bad or failure about yourself  0 0 0 2 2  Trouble concentrating '1 2 2 2 2  ' Moving slowly or fidgety/restless 0 0 0 2 2  Suicidal thoughts 0 0 0 0 1  PHQ-9 Score '5 12 12 17 17  ' Difficult doing work/chores - - - Somewhat difficult Very difficult  Some encounter information is confidential and restricted. Go to Review Flowsheets activity to see all data.  Some recent data might be hidden    phq 9 is positive   Fall Risk: Fall Risk  12/27/2021 11/05/2021 10/31/2021 08/21/2020 08/03/2020  Falls in the past year? '1 1 1 1 1  ' Number falls in past yr: '1 1 1 1 1  ' Injury with Fall? 1 0 '1 1 1  ' Risk for fall due to : No Fall Risks No Fall Risks No Fall Risks History of fall(s) History of fall(s)  Follow up Falls prevention discussed Falls prevention discussed Falls prevention discussed - -      Functional Status Survey: Is the patient deaf or have difficulty hearing?: No Does the patient have difficulty seeing, even when wearing glasses/contacts?: No Does the patient have difficulty concentrating, remembering, or making decisions?: Yes Does the patient have difficulty walking or climbing stairs?: No Does the patient have difficulty dressing or bathing?: No Does the patient have difficulty doing errands alone such as visiting a doctor's office or shopping?: No    Assessment & Plan  1. Pain in finger of right hand  - DG Finger Little Right; Future - Sedimentation rate - C-reactive protein - ANA,IFA RA Diag Pnl w/rflx Tit/Patn - uric acid   2. Swelling of right little finger  - DG Finger Little Right; Future - Sedimentation rate - C-reactive protein - ANA,IFA RA Diag Pnl w/rflx Tit/Patn  - uric acid

## 2021-12-27 ENCOUNTER — Encounter: Payer: Self-pay | Admitting: Family Medicine

## 2021-12-27 ENCOUNTER — Telehealth: Payer: Self-pay

## 2021-12-27 ENCOUNTER — Ambulatory Visit
Admission: RE | Admit: 2021-12-27 | Discharge: 2021-12-27 | Disposition: A | Discharge status: Home / Self Care | Payer: Managed Care, Other (non HMO) | Source: Ambulatory Visit | Attending: Family Medicine | Admitting: Family Medicine

## 2021-12-27 ENCOUNTER — Ambulatory Visit
Admission: RE | Admit: 2021-12-27 | Discharge: 2021-12-27 | Disposition: A | Discharge status: Home / Self Care | Payer: Managed Care, Other (non HMO) | Attending: Family Medicine | Admitting: Family Medicine

## 2021-12-27 ENCOUNTER — Other Ambulatory Visit: Payer: Self-pay

## 2021-12-27 ENCOUNTER — Ambulatory Visit (INDEPENDENT_AMBULATORY_CARE_PROVIDER_SITE_OTHER): Payer: Managed Care, Other (non HMO) | Admitting: Family Medicine

## 2021-12-27 VITALS — BP 130/84 | HR 84 | Resp 16 | Ht 63.0 in | Wt 174.0 lb

## 2021-12-27 DIAGNOSIS — M7989 Other specified soft tissue disorders: Secondary | ICD-10-CM | POA: Insufficient documentation

## 2021-12-27 DIAGNOSIS — M79644 Pain in right finger(s): Secondary | ICD-10-CM | POA: Insufficient documentation

## 2021-12-27 NOTE — Telephone Encounter (Signed)
Lvm to inform pt

## 2021-12-27 NOTE — Telephone Encounter (Signed)
Pt informed  ? ?Copied from CRM [id number]. Topic: General - Other ?>> Dec 27, 2021 12:09 PM [place], [place] wrote: ?Reason for CRM:pt called in has already had x ray done but she is asking does she need to also go have more tests done. Please call back ?

## 2021-12-29 LAB — URIC ACID: Uric Acid: 4.1 mg/dL (ref 2.6–6.2)

## 2022-01-01 NOTE — Progress Notes (Signed)
Name: Virginia Walls   MRN: [id number]    DOB: 1981/05/03   Date:01/02/2022 ? ?     Progress Note ? ?Subjective ? ?Chief Complaint ? ?Follow Up ? ?HPI ? ?Pain finger: she states two months ago she noticed some pain on MCP on 4th middle finger while at work, that resolved by itself but shortly after developed pain and swelling of right fifth finger  while at work. She types all day. No redness or increase in warmth but tender to touch and with movement. No other joints involved at this time. Unsure if family history of rheumatological diseases.. ? ?Migraine: she states pain can start on nuchal area or frontal area and radiates to opposite direction. Described as pounding, sharp associated with dizziness, photophobia, no nausea or vomiting. 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 She was having daily headaches in January , she back to Topamax at night and episodes down to a couple times a week, taking Nurtec prn and it helps but still needs to take a nap for 15 minutes and feels better, able to function.  ?  ?Insulin resistance: A1C used to be high at 5.7 %, currently normal, but had elevated fasting glucose, she has changed her diet, eating salads for lunch, more vegetables and smaller portions for dinner, she will start going for walks since it is getting warmer. She started on metformin in January but it caused diarrhea and she is currently taking it every other day Weight is down from 169 lbs to 173 lbs in 2 months  ? ?Grief/Major depression recurrent: she is feeling better, still has bad days but when feeling sad she goes see her grand-daughter, she called psychiatrist but did not get an appointment yet, she is back on medications but phq 9 is higher, she states this time of the year is very high since her son got work in January 08, 2023 and he died in 02/08/23 , she is grieving again ? ?Patient Active Problem List  ? Diagnosis Date Noted  ? Noncompliance with medication regimen 01/16/2021   ? Alcohol abuse 01/16/2021  ? MDD (major depressive disorder), recurrent, severe, with psychosis ([place]) 04/11/2020  ? GAD (generalized anxiety disorder) 04/11/2020  ? Bereavement 04/11/2020  ? At risk for prolonged QT interval syndrome 04/11/2020  ? High risk medication use 04/11/2020  ? Anxiety 11/13/2017  ? Chronic constipation 08/12/2017  ? Hyperglycemia 05/04/2015  ? Tattoos 05/04/2015  ? Headache, migraine 04/29/2015  ? Excess weight 04/29/2015  ? Restless leg 04/29/2015  ? Snores 04/29/2015  ? Vitamin D deficiency 04/29/2015  ? ? ?Past Surgical History:  ?Procedure Laterality Date  ? ABDOMINAL HYSTERECTOMY    ? TUBAL LIGATION    ? ? ?Family History  ?Problem Relation Age of Onset  ? Heart disease Mother   ? Asthma Mother   ? Hypothyroidism Mother   ? Bipolar disorder Mother   ? Hypothyroidism Sister   ? Diabetes Sister   ? Hyperlipidemia Sister   ? Asthma Son   ? Heart disease Paternal Grandmother   ?     Pacemaker  ? Leukemia Paternal Grandmother   ? Diabetes Paternal Grandmother   ? Bipolar disorder Maternal Aunt   ? Bipolar disorder Maternal Uncle   ? ? ?Social History  ? ?Tobacco Use  ? Smoking status: Some Days  ?  Packs/day: 0.25  ?  Years: 4.00  ?  Pack years: 1.00  ?  Types:  Cigarettes  ?  Start date: 05/04/2011  ?  Last attempt to quit: 07/21/2020  ?  Years since quitting: 1.4  ? Smokeless tobacco: Never  ? Tobacco comments:  ?  She recently joined a quit program at work, smoking at most 3 cigarettes per day now   ?Substance Use Topics  ? Alcohol use: Yes  ?  Alcohol/week: 0.0 standard drinks  ?  Comment: occasionally  ? ? ? ?Current Outpatient Medications:  ?  Cyanocobalamin (B-12) 1000 MCG SUBL, Place 1 tablet under the tongue 3 (three) times a week., Disp: 30 tablet, Rfl: 0 ?  metFORMIN (GLUCOPHAGE-XR) 500 MG 24 hr tablet, Take 1 tablet (500 mg total) by mouth daily with breakfast., Disp: 90 tablet, Rfl: 0 ?  Rimegepant Sulfate (NURTEC) 75 MG TBDP, Take 1 tablet by mouth daily as needed. Max of 2 in  24 hours, Disp: 16 tablet, Rfl: 2 ?  topiramate (TOPAMAX) 50 MG tablet, TAKE 1 TO 2 TABLETS(50 TO 100 MG) BY MOUTH AT BEDTIME AS NEEDED, Disp: 180 tablet, Rfl: 1 ?  Vitamin D, Ergocalciferol, (DRISDOL) 1.25 MG (50000 UNIT) CAPS capsule, TAKE 1 CAPSULE (50,000 UNITS TOTAL) BY MOUTH EVERY 7 (SEVEN) DAYS. (NOT COVERED), Disp: 12 capsule, Rfl: 0 ? ?Allergies  ?Allergen Reactions  ? Corylus   ? Hydrocodone   ? ? ?I personally reviewed active problem list, medication list, allergies, family history, social history, health maintenance with the patient/caregiver today. ? ? ?ROS ? ?Ten systems reviewed and is negative except as mentioned in HPI  ? ?Objective ? ?Vitals:  ? 01/02/22 1501  ?BP: 122/86  ?Pulse: (!) 105  ?Resp: 16  ?SpO2: 100%  ?Weight: 173 lb (78.5 kg)  ?Height: _0  (1.6 m)  ? ? ?Body mass index is 30.65 kg/m?. ? ?Physical Exam ? ?Constitutional: Patient appears well-developed and well-nourished. Obese  No distress.  ?HEENT: head atraumatic, normocephalic, pupils equal and reactive to light,  neck supple ?Cardiovascular: Normal rate, regular rhythm and normal heart sounds.  No murmur heard. No BLE edema. ?Pulmonary/Chest: Effort normal and breath sounds normal. No respiratory distress. ?Abdominal: Soft.  There is no tenderness. ?Muscular skeletal: swollen 5 th finger right hand , tender to touch  ?Psychiatric: Patient has a normal mood and affect. behavior is normal. Judgment and thought content normal.  ? ?Recent Results (from the past 2160 hour(s))  ?Comprehensive metabolic panel     Status: Abnormal  ? Collection Time: 10/31/21  3:22 PM  ?Result Value Ref Range  ? Glucose 102 (H) 70 - 99 mg/dL  ? BUN 7 6 - 24 mg/dL  ? Creatinine, Ser 0.84 0.57 - 1.00 mg/dL  ? eGFR 90 >59 mL/min/1.73  ? BUN/Creatinine Ratio 8 (L) 9 - 23  ? Sodium 142 134 - 144 mmol/L  ? Potassium 3.9 3.5 - 5.2 mmol/L  ? Chloride 105 96 - 106 mmol/L  ? CO2 23 20 - 29 mmol/L  ? Calcium 9.2 8.7 - 10.2 mg/dL  ? Total Protein 7.4 6.0 - 8.5 g/dL   ? Albumin 4.5 3.8 - 4.8 g/dL  ? Globulin, Total 2.9 1.5 - 4.5 g/dL  ? Albumin/Globulin Ratio 1.6 1.2 - 2.2  ? Bilirubin Total 0.2 0.0 - 1.2 mg/dL  ? Alkaline Phosphatase 51 44 - 121 IU/L  ? AST 11 0 - 40 IU/L  ? ALT 9 0 - 32 IU/L  ?TSH     Status: None  ? Collection Time: 10/31/21  3:22 PM  ?Result Value Ref Range  ?  TSH 1.710 0.450 - 4.500 uIU/mL  ?Vitamin B12     Status: None  ? Collection Time: 10/31/21  3:22 PM  ?Result Value Ref Range  ? Vitamin B-12 397 232 - 1,245 pg/mL  ?CBC with Differential/Platelet     Status: None  ? Collection Time: 10/31/21  3:22 PM  ?Result Value Ref Range  ? WBC 7.0 3.4 - 10.8 x10E3/uL  ? RBC 4.49 3.77 - 5.28 x10E6/uL  ? Hemoglobin 14.2 11.1 - 15.9 g/dL  ? Hematocrit 41.7 34.0 - 46.6 %  ? MCV 93 79 - 97 fL  ? MCH 31.6 26.6 - 33.0 pg  ? MCHC 34.1 31.5 - 35.7 g/dL  ? RDW 12.1 11.7 - 15.4 %  ? Platelets 271 150 - 450 x10E3/uL  ? Neutrophils 65 Not Estab. %  ? Lymphs 29 Not Estab. %  ? Monocytes 4 Not Estab. %  ? Eos 2 Not Estab. %  ? Basos 0 Not Estab. %  ? Neutrophils Absolute 4.5 1.4 - 7.0 x10E3/uL  ? Lymphocytes Absolute 2.1 0.7 - 3.1 x10E3/uL  ? Monocytes Absolute 0.3 0.1 - 0.9 x10E3/uL  ? EOS (ABSOLUTE) 0.1 0.0 - 0.4 x10E3/uL  ? Basophils Absolute 0.0 0.0 - 0.2 x10E3/uL  ? Immature Granulocytes 0 Not Estab. %  ? Immature Grans (Abs) 0.0 0.0 - 0.1 x10E3/uL  ?Hemoglobin A1c     Status: None  ? Collection Time: 10/31/21  3:22 PM  ?Result Value Ref Range  ? Hgb A1c MFr Bld 5.5 4.8 - 5.6 %  ?  Comment:          Prediabetes: 5.7 - 6.4 ?         Diabetes: >6.4 ?         Glycemic control for adults with diabetes: <7.0 ?  ? Est. average glucose Bld gHb Est-mCnc 111 mg/dL  ?Lipid panel     Status: Abnormal  ? Collection Time: 10/31/21  3:22 PM  ?Result Value Ref Range  ? Cholesterol, Total 231 (H) 100 - 199 mg/dL  ? Triglycerides 87 0 - 149 mg/dL  ? HDL 66 >39 mg/dL  ? VLDL Cholesterol Cal 15 5 - 40 mg/dL  ? LDL Chol Calc (NIH) 150 (H) 0 - 99 mg/dL  ? Chol/HDL Ratio 3.5 0.0 - 4.4 ratio  ?   Comment:                                   T. Chol/HDL Ratio ?                                            Men  Women ?                              1/2 Avg.Risk  3.4    3.3 ?

## 2022-01-02 ENCOUNTER — Ambulatory Visit (INDEPENDENT_AMBULATORY_CARE_PROVIDER_SITE_OTHER): Payer: Managed Care, Other (non HMO) | Admitting: Family Medicine

## 2022-01-02 ENCOUNTER — Encounter: Payer: Self-pay | Admitting: Family Medicine

## 2022-01-02 VITALS — BP 122/86 | HR 105 | Resp 16 | Ht 63.0 in | Wt 173.0 lb

## 2022-01-02 DIAGNOSIS — F333 Major depressive disorder, recurrent, severe with psychotic symptoms: Secondary | ICD-10-CM

## 2022-01-02 DIAGNOSIS — E538 Deficiency of other specified B group vitamins: Secondary | ICD-10-CM

## 2022-01-02 DIAGNOSIS — E785 Hyperlipidemia, unspecified: Secondary | ICD-10-CM

## 2022-01-02 DIAGNOSIS — M79644 Pain in right finger(s): Secondary | ICD-10-CM | POA: Diagnosis not present

## 2022-01-02 DIAGNOSIS — E8881 Metabolic syndrome: Secondary | ICD-10-CM

## 2022-01-02 DIAGNOSIS — G43009 Migraine without aura, not intractable, without status migrainosus: Secondary | ICD-10-CM

## 2022-01-02 DIAGNOSIS — E559 Vitamin D deficiency, unspecified: Secondary | ICD-10-CM

## 2022-01-02 DIAGNOSIS — M7989 Other specified soft tissue disorders: Secondary | ICD-10-CM

## 2022-01-02 MED ORDER — METFORMIN HCL ER 500 MG PO TB24: 500.0000 mg | ORAL_TABLET | Freq: Every day | ORAL | 1 refills | Status: DC

## 2022-01-02 MED ORDER — TOPIRAMATE 100 MG PO TABS: 100.0000 mg | ORAL_TABLET | Freq: Every evening | ORAL | 1 refills | Status: DC

## 2022-01-03 LAB — SEDIMENTATION RATE: Sed Rate: 41 mm/hr — ABNORMAL HIGH (ref 0–32)

## 2022-01-03 LAB — ANA,IFA RA DIAG PNL W/RFLX TIT/PATN
ANA Titer 1: NEGATIVE
Cyclic Citrullin Peptide Ab: 5 units (ref 0–19)
Rheumatoid fact SerPl-aCnc: 11.2 IU/mL (ref <14.0)

## 2022-01-03 LAB — C-REACTIVE PROTEIN: CRP: 5 mg/L (ref 0–10)

## 2022-01-24 ENCOUNTER — Ambulatory Visit
Admission: RE | Admit: 2022-01-24 | Discharge: 2022-01-24 | Disposition: A | Discharge status: Home / Self Care | Payer: Managed Care, Other (non HMO) | Source: Ambulatory Visit | Attending: Family Medicine | Admitting: Family Medicine

## 2022-01-24 DIAGNOSIS — Z1231 Encounter for screening mammogram for malignant neoplasm of breast: Secondary | ICD-10-CM | POA: Diagnosis present

## 2022-01-28 ENCOUNTER — Other Ambulatory Visit: Payer: Self-pay

## 2022-01-28 ENCOUNTER — Other Ambulatory Visit: Payer: Self-pay | Admitting: Family Medicine

## 2022-01-28 DIAGNOSIS — N6489 Other specified disorders of breast: Secondary | ICD-10-CM

## 2022-01-28 DIAGNOSIS — R928 Other abnormal and inconclusive findings on diagnostic imaging of breast: Secondary | ICD-10-CM

## 2022-01-28 DIAGNOSIS — N63 Unspecified lump in unspecified breast: Secondary | ICD-10-CM

## 2022-01-29 ENCOUNTER — Other Ambulatory Visit: Payer: Self-pay | Admitting: Family Medicine

## 2022-01-29 DIAGNOSIS — E559 Vitamin D deficiency, unspecified: Secondary | ICD-10-CM

## 2022-02-05 ENCOUNTER — Telehealth: Payer: Self-pay | Admitting: Family Medicine

## 2022-02-05 DIAGNOSIS — E8881 Metabolic syndrome: Secondary | ICD-10-CM

## 2022-02-06 NOTE — Telephone Encounter (Signed)
Appt sch'd for 10.17.2023 pt aware of medication being sent to pharmacy

## 2022-02-13 ENCOUNTER — Ambulatory Visit: Payer: Managed Care, Other (non HMO) | Admitting: Nurse Practitioner

## 2022-02-14 ENCOUNTER — Other Ambulatory Visit: Payer: Managed Care, Other (non HMO)

## 2022-02-15 NOTE — Progress Notes (Signed)
Name: Virginia Walls   MRN: [id number]    DOB: Jul 20, 1981   Date:02/18/2022 ? ?     Progress Note ? ?Subjective ? ?Chief Complaint ? ?Vaginal Discharge/ Odor ? ?HPI ? ?Vaginal discharge and odor: she has a remote history of BV and was doing better however over the past 6 months she has been using otc medication like Monistat about once a month that helps temporarily . She states discharge is white and sometimes thick, not associated with pruritis or pelvic pain. Denies fever or chills. No change in medication or recent antibiotic use.  ?No new sexual partner.  ? ? ?Patient Active Problem List  ? Diagnosis Date Noted  ? Noncompliance with medication regimen 01/16/2021  ? Alcohol abuse 01/16/2021  ? MDD (major depressive disorder), recurrent, severe, with psychosis ([facility]) 04/11/2020  ? GAD (generalized anxiety disorder) 04/11/2020  ? Bereavement 04/11/2020  ? At risk for prolonged QT interval syndrome 04/11/2020  ? High risk medication use 04/11/2020  ? Anxiety 11/13/2017  ? Chronic constipation 08/12/2017  ? Hyperglycemia 05/04/2015  ? Tattoos 05/04/2015  ? Headache, migraine 04/29/2015  ? Excess weight 04/29/2015  ? Restless leg 04/29/2015  ? Snores 04/29/2015  ? Vitamin D deficiency 04/29/2015  ? ? ?Past Surgical History:  ?Procedure Laterality Date  ? ABDOMINAL HYSTERECTOMY    ? TUBAL LIGATION    ? ? ?Family History  ?Problem Relation Age of Onset  ? Heart disease Mother   ? Asthma Mother   ? Hypothyroidism Mother   ? Bipolar disorder Mother   ? Hypothyroidism Sister   ? Diabetes Sister   ? Hyperlipidemia Sister   ? Bipolar disorder Maternal Aunt   ? Bipolar disorder Maternal Uncle   ? Breast cancer Maternal Grandmother   ? Heart disease Paternal Grandmother   ?     Pacemaker  ? Leukemia Paternal Grandmother   ? Diabetes Paternal Grandmother   ? Asthma Son   ? ? ?Social History  ? ?Tobacco Use  ? Smoking status: Some Days  ?  Packs/day: 0.25  ?  Years: 4.00  ?  Pack years: 1.00  ?  Types: Cigarettes  ?  Start date:  05/04/2011  ?  Last attempt to quit: 07/21/2020  ?  Years since quitting: 1.5  ? Smokeless tobacco: Never  ? Tobacco comments:  ?  She recently joined a quit program at work, smoking at most 3 cigarettes per day now   ?Substance Use Topics  ? Alcohol use: Yes  ?  Alcohol/week: 0.0 standard drinks  ?  Comment: occasionally  ? ? ? ?Current Outpatient Medications:  ?  Cyanocobalamin (B-12) 1000 MCG SUBL, Place 1 tablet under the tongue 3 (three) times a week., Disp: 30 tablet, Rfl: 0 ?  metFORMIN (GLUCOPHAGE-XR) 500 MG 24 hr tablet, TAKE 1 TABLET(500 MG) BY MOUTH DAILY WITH BREAKFAST, Disp: 90 tablet, Rfl: 1 ?  Rimegepant Sulfate (NURTEC) 75 MG TBDP, Take 1 tablet by mouth daily as needed. Max of 2 in 24 hours, Disp: 16 tablet, Rfl: 2 ?  topiramate (TOPAMAX) 100 MG tablet, Take 1 tablet (100 mg total) by mouth at bedtime., Disp: 90 tablet, Rfl: 1 ?  Vitamin D, Ergocalciferol, (DRISDOL) 1.25 MG (50000 UNIT) CAPS capsule, TAKE ONE CAPSULE BY MOUTH EVERY 7 DAYS, Disp: 12 capsule, Rfl: 0 ? ?Allergies  ?Allergen Reactions  ? Corylus   ? Hydrocodone   ? ? ?I personally reviewed active problem list, medication list, allergies, family history, social history, health maintenance  with the patient/caregiver today. ? ? ?ROS ? ?Ten systems reviewed and is negative except as mentioned in HPI  ?She continues to have joint pains and seeing Rheumatologist  ? ?Objective ? ?Vitals:  ? 02/18/22 1359  ?BP: 130/84  ?Pulse: 91  ?Resp: 16  ?SpO2: 98%  ?Weight: 176 lb (79.8 kg)  ?Height:  (1.6 m)  ? ? ?Body mass index is 31.18 kg/m?. ? ?Physical Exam ? ?Constitutional: Patient appears well-developed and well-nourished. Obese  No distress.  ?HEENT: head atraumatic, normocephalic, pupils equal and reactive to light,, neck supple ?Cardiovascular: Normal rate, regular rhythm and normal heart sounds.  No murmur heard. No BLE edema. ?Pulmonary/Chest: Effort normal and breath sounds normal. No respiratory distress. ?Abdominal: Soft.  There is no  tenderness. ?Psychiatric: Patient has a normal mood and affect. behavior is normal. Judgment and thought content normal.  ? ?Recent Results (from the past 2160 hour(s))  ?Sedimentation rate     Status: Abnormal  ? Collection Time: 12/28/21  1:12 PM  ?Result Value Ref Range  ? Sed Rate 41 (H) 0 - 32 mm/hr  ?C-reactive protein     Status: None  ? Collection Time: 12/28/21  1:12 PM  ?Result Value Ref Range  ? CRP 5 0 - 10 mg/L  ?ANA,IFA RA Diag Pnl w/rflx Tit/Patn     Status: None  ? Collection Time: 12/28/21  1:12 PM  ?Result Value Ref Range  ? ANA Titer 1 Negative   ?  Comment:                                      Negative   <1:80 ?                                     Borderline  1:80 ?                                     Positive   >1:80 ?ICAP nomenclature: AC-0 ?For more information about Hep-2 cell patterns use ?ANApatterns.org, the official website for the International ?Consensus on Antinuclear Antibody (ANA) Patterns (ICAP). ?  ? Rhuematoid fact SerPl-aCnc 11.2 <14.0 IU/mL  ? Cyclic Citrullin Peptide Ab 5 0 - 19 units  ?  Comment:                           Negative               <20 ?                          Weak positive      20 - 39 ?                          Moderate positive  40 - 59 ?                          Strong positive        >59 ?  ?Uric acid     Status: None  ? Collection Time: 12/28/21  1:19 PM  ?Result Value Ref Range  ? Uric Acid  4.1 2.6 - 6.2 mg/dL  ?  Comment:            Therapeutic target for gout patients: <6.0  ? ? ?PHQ2/9: ? ?  02/18/2022  ?  1:55 PM 01/02/2022  ?  3:00 PM 12/27/2021  ? 11:01 AM 11/05/2021  ?  3:48 PM 10/31/2021  ?  1:45 PM  ?Depression screen PHQ 2/9  ?Decreased Interest 1 1 1 2 2   ?Down, Depressed, Hopeless 1 1 1 2 2   ?PHQ - 2 Score 2 2 2 4 4   ?Altered sleeping 0 1 0 3 3  ?Tired, decreased energy 0 1 0 3 3  ?Change in appetite 0 3 2 0 0  ?Feeling bad or failure about yourself  1 0 0 0 0  ?Trouble concentrating 1 3 1 2 2   ?Moving slowly or fidgety/restless 0 0 0 0 0  ?Suicidal  thoughts 0 0 0 0 0  ?PHQ-9 Score 4 10 5 12 12   ?  ?phq 9 is positive ? ? ?Fall Risk: ? ?  02/18/2022  ?  1:54 PM 01/02/2022  ?  2:55 PM 12/27/2021  ? 11:01 AM 11/05/2021  ?  3:47 PM 10/31/2021  ?  1:45 PM  ?Fall Risk   ?Falls in the past year? 1 1 1 1 1   ?Number falls in past yr: 1 1 1 1 1   ?Injury with Fall? 1 1 1  0 1  ?Risk for fall due to : No Fall Risks No Fall Risks No Fall Risks No Fall Risks No Fall Risks  ?Follow up Falls prevention discussed Falls prevention discussed Falls prevention discussed Falls prevention discussed Falls prevention discussed  ? ? ? ? ?Functional Status Survey: ?Is the patient deaf or have difficulty hearing?: No ?Does the patient have difficulty seeing, even when wearing glasses/contacts?: No ?Does the patient have difficulty concentrating, remembering, or making decisions?: Yes ?Does the patient have difficulty walking or climbing stairs?: No ?Does the patient have difficulty dressing or bathing?: No ?Does the patient have difficulty doing errands alone such as visiting a doctor's office or shopping?: No ? ? ? ?Assessment & Plan ? ?1. Vaginal discharge ? ?- Cervicovaginal ancillary only ? ?2. Vaginal odor ? ?- Cervicovaginal ancillary only  ? ?She agrees on waiting on results prior to therapy ?

## 2022-02-18 ENCOUNTER — Other Ambulatory Visit (HOSPITAL_COMMUNITY)
Admission: RE | Admit: 2022-02-18 | Discharge: 2022-02-18 | Disposition: A | Discharge status: Home / Self Care | Payer: Managed Care, Other (non HMO) | Source: Ambulatory Visit | Attending: Nurse Practitioner | Admitting: Nurse Practitioner

## 2022-02-18 ENCOUNTER — Encounter: Payer: Self-pay | Admitting: Family Medicine

## 2022-02-18 ENCOUNTER — Ambulatory Visit (INDEPENDENT_AMBULATORY_CARE_PROVIDER_SITE_OTHER): Payer: Managed Care, Other (non HMO) | Admitting: Family Medicine

## 2022-02-18 VITALS — BP 130/84 | HR 91 | Resp 16 | Ht 63.0 in | Wt 176.0 lb

## 2022-02-18 DIAGNOSIS — N898 Other specified noninflammatory disorders of vagina: Secondary | ICD-10-CM

## 2022-02-20 ENCOUNTER — Other Ambulatory Visit: Payer: Self-pay | Admitting: Family Medicine

## 2022-02-20 LAB — CERVICOVAGINAL ANCILLARY ONLY
Bacterial Vaginitis (gardnerella): POSITIVE — AB
Candida Glabrata: NEGATIVE
Candida Vaginitis: NEGATIVE
Chlamydia: NEGATIVE
Comment: NEGATIVE
Comment: NEGATIVE
Comment: NEGATIVE
Comment: NEGATIVE
Comment: NEGATIVE
Comment: NORMAL
Neisseria Gonorrhea: NEGATIVE
Trichomonas: NEGATIVE

## 2022-02-20 MED ORDER — METRONIDAZOLE 500 MG PO TABS: 500.0000 mg | ORAL_TABLET | Freq: Two times a day (BID) | ORAL | 0 refills | Status: AC

## 2022-02-25 DIAGNOSIS — M1990 Unspecified osteoarthritis, unspecified site: Secondary | ICD-10-CM | POA: Insufficient documentation

## 2022-03-01 ENCOUNTER — Ambulatory Visit
Admission: RE | Admit: 2022-03-01 | Discharge: 2022-03-01 | Disposition: A | Discharge status: Home / Self Care | Payer: Managed Care, Other (non HMO) | Source: Ambulatory Visit | Attending: Family Medicine | Admitting: Family Medicine

## 2022-03-01 DIAGNOSIS — N6489 Other specified disorders of breast: Secondary | ICD-10-CM

## 2022-03-01 DIAGNOSIS — N63 Unspecified lump in unspecified breast: Secondary | ICD-10-CM | POA: Diagnosis present

## 2022-03-01 DIAGNOSIS — R928 Other abnormal and inconclusive findings on diagnostic imaging of breast: Secondary | ICD-10-CM

## 2022-03-04 ENCOUNTER — Other Ambulatory Visit: Payer: Self-pay | Admitting: Family Medicine

## 2022-03-04 DIAGNOSIS — R928 Other abnormal and inconclusive findings on diagnostic imaging of breast: Secondary | ICD-10-CM

## 2022-03-04 DIAGNOSIS — N63 Unspecified lump in unspecified breast: Secondary | ICD-10-CM

## 2022-03-21 ENCOUNTER — Ambulatory Visit
Admission: RE | Admit: 2022-03-21 | Discharge: 2022-03-21 | Disposition: A | Discharge status: Home / Self Care | Payer: Managed Care, Other (non HMO) | Source: Ambulatory Visit | Attending: Family Medicine | Admitting: Family Medicine

## 2022-03-21 DIAGNOSIS — N63 Unspecified lump in unspecified breast: Secondary | ICD-10-CM

## 2022-03-21 DIAGNOSIS — R928 Other abnormal and inconclusive findings on diagnostic imaging of breast: Secondary | ICD-10-CM | POA: Diagnosis not present

## 2022-03-21 HISTORY — PX: BREAST BIOPSY: SHX20

## 2022-03-22 LAB — SURGICAL PATHOLOGY

## 2022-04-08 NOTE — Progress Notes (Unsigned)
Name: Virginia Walls   MRN: [id number]    DOB: Nov 20, 1980   Date:04/09/2022       Progress Note  Subjective  Chief Complaint  Follow Up BV/ Swollen L Knee  HPI  BV: treated last month, she states noticing some odor again, discussed BV, wearing cotton underwear , avoid douching. Wear loses clothes.  Inflammatory arthritis  she states 6  months ago she noticed some pain on MCP on 4th middle finger while at work, that resolved by itself but shortly after developed pain and swelling of right fifth finger  while at work. She types all day. No redness or increase in warmth but tender to touch and with movement. She had multiple labs done and only abnormal was sed rate, seen by Rheumatologist , Dr. Posey Pronto and diagnosed with inflammatory and started on Plaquenil in February 04, 2022 . She states pain on her finger improved however left knee is now swollen, no redness, but painful when walking and also with pressure , symptoms started about 10 days ago and is getting progressively worse.    Migraine: she states pain can start on nuchal area or frontal area and radiates to opposite direction. Described as pounding, sharp associated with dizziness, photophobia, no nausea or vomiting. 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 She was having daily headaches in January , she back to Topamax at night and episodes down to an average of a couple times a month, but had a few episodes last week    Insulin resistance: A1C used to be high at 5.7 %, currently normal, but had elevated fasting glucose, she has changed her diet, eating salads for lunch, more vegetables and smaller portions for dinner,  she is using weight watchers but has gained some weight recently    Grief/Major depression recurrent: she is feeling better, still has bad days but when feeling sad she goes see her grand-daughter, she called psychiatrist but did not get an appointment yet, she is back on medications but phq 9 is  higher, she states this time of the year is very high since her son got work in 05-Jan-2023 and he died in 2023/02/05 , she has been unable to get a hold of her psychiatrist. We will try placing another referral   Vitamin D def: she  is back on supplementation    Low B12: discussed towards low end of normal, she stopped taking supplementation but will resume it now     Dyslipidemia:    The 10-year ASCVD risk score (Arnett DK, et al., 2018/02/04) is: 0.6%   Values used to calculate the score:     Age: 41 years     Sex: Female     Is Non-Hispanic African American: Yes     Diabetic: No     Tobacco smoker: Yes     Systolic Blood Pressure: 123456 mmHg     Is BP treated: No     HDL Cholesterol: 66 mg/dL     Total Cholesterol: 231 mg/dL   Patient Active Problem List   Diagnosis Date Noted   Intertrigo 04/09/2022   Bacterial vaginosis 04/09/2022   Effusion of left knee 04/09/2022   Migraine without aura and without status migrainosus, not intractable 04/09/2022   Inflammatory arthritis 02/25/2022   Noncompliance with medication regimen 01/16/2021   Moderate recurrent major depression ([place]) 04/11/2020   GAD (generalized anxiety disorder) 04/11/2020   Bereavement 04/11/2020   At risk for  prolonged QT interval syndrome 04/11/2020   High risk medication use 04/11/2020   Chronic constipation 08/12/2017   Hyperglycemia 05/04/2015   Tattoos 05/04/2015   Headache, migraine 04/29/2015   Restless leg 04/29/2015   Snores 04/29/2015   Vitamin D deficiency 04/29/2015    Past Surgical History:  Procedure Laterality Date   ABDOMINAL HYSTERECTOMY     BREAST BIOPSY Left 03/21/2022   US biopsy/ ribbon clip/ path pending   TUBAL LIGATION      Family History  Problem Relation Age of Onset   Heart disease Mother    Asthma Mother    Hypothyroidism Mother    Bipolar disorder Mother    Hypothyroidism Sister    Diabetes Sister    Hyperlipidemia Sister    Bipolar disorder Maternal Aunt    Bipolar disorder  Maternal Uncle    Breast cancer Maternal Grandmother    Heart disease Paternal Grandmother        Pacemaker   Leukemia Paternal Grandmother    Diabetes Paternal Grandmother    Asthma Son     Social History   Tobacco Use   Smoking status: Some Days    Packs/day: 0.25    Years: 4.00    Total pack years: 1.00    Types: Cigarettes    Start date: 05/04/2011    Last attempt to quit: 07/21/2020    Years since quitting: 1.7   Smokeless tobacco: Never   Tobacco comments:    She recently joined a quit program at work, smoking at most 3 cigarettes per day now   Substance Use Topics   Alcohol use: Yes    Alcohol/week: 0.0 standard drinks of alcohol    Comment: occasionally     Current Outpatient Medications:    celecoxib (CELEBREX) 200 MG capsule, Take 1 capsule (200 mg total) by mouth 2 (two) times daily., Disp: 30 capsule, Rfl: 0   metFORMIN (GLUCOPHAGE-XR) 500 MG 24 hr tablet, TAKE 1 TABLET(500 MG) BY MOUTH DAILY WITH BREAKFAST, Disp: 90 tablet, Rfl: 1   Rimegepant Sulfate (NURTEC) 75 MG TBDP, Take 1 tablet by mouth daily as needed. Max of 2 in 24 hours, Disp: 16 tablet, Rfl: 2   topiramate (TOPAMAX) 50 MG tablet, Take 50 mg by mouth 2 (two) times daily., Disp: , Rfl:    Vitamin D, Ergocalciferol, (DRISDOL) 1.25 MG (50000 UNIT) CAPS capsule, TAKE ONE CAPSULE BY MOUTH EVERY 7 DAYS, Disp: 12 capsule, Rfl: 0   [START ON 04/10/2022] Cyanocobalamin (B-12) 1000 MCG SUBL, Place 1 tablet under the tongue 3 (three) times a week., Disp: 100 tablet, Rfl: 1  Allergies  Allergen Reactions   Corylus    Hydrocodone     I personally reviewed active problem list, medication list, allergies, family history, social history, health maintenance with the patient/caregiver today.   ROS  Constitutional: Negative for fever or weight change.  Respiratory: Negative for cough and shortness of breath.   Cardiovascular: Negative for chest pain or palpitations.  Gastrointestinal: Negative for abdominal  pain, no bowel changes.  Musculoskeletal:positive for gait problem and left joint swelling.  Skin: positive for hyperpigmentation under breast .  Neurological: Negative for dizziness but has intermittent headache.  No other specific complaints in a complete review of systems (except as listed in HPI above).   Objective  Vitals:   04/09/22 1003  BP: 118/76  Pulse: 91  Resp: 16  SpO2: 100%  Weight: 179 lb (81.2 kg)  Height: 5\' 3"  (1.6 m)    Body mass  index is 31.71 kg/m.  Physical Exam  Constitutional: Patient appears well-developed and well-nourished. Obese  No distress.  HEENT: head atraumatic, normocephalic, pupils equal and reactive to light, neck supple Cardiovascular: Normal rate, regular rhythm and normal heart sounds.  No murmur heard. No BLE edema. Pulmonary/Chest: Effort normal and breath sounds normal. No respiratory distress. Abdominal: Soft.  There is no tenderness. Muscular skeletal: antalgic gait, large effusion of left knee, no redness or increase in warmth  Psychiatric: Patient has a normal mood and affect. behavior is normal. Judgment and thought content normal.  Skin: hyperpigmentation under breast   Recent Results (from the past 2160 hour(s))  Cervicovaginal ancillary only     Status: Abnormal   Collection Time: 02/18/22  2:01 PM  Result Value Ref Range   Neisseria Gonorrhea Negative    Chlamydia Negative    Trichomonas Negative    Bacterial Vaginitis (gardnerella) Positive (A)    Candida Vaginitis Negative    Candida Glabrata Negative    Comment      Normal Reference Range Bacterial Vaginosis - Negative   Comment Normal Reference Range Candida Species - Negative    Comment Normal Reference Range Candida Galbrata - Negative    Comment Normal Reference Range Trichomonas - Negative    Comment Normal Reference Ranger Chlamydia - Negative    Comment      Normal Reference Range Neisseria Gonorrhea - Negative  Surgical pathology     Status: None    Collection Time: 03/21/22  8:56 AM  Result Value Ref Range   SURGICAL PATHOLOGY      SURGICAL PATHOLOGY CASE: [id number] PATIENT: Jens Som Surgical Pathology Report     Specimen Submitted: A. Breast, left  Clinical History: FA, PASH? r/o malignancy. Ribbon-shaped clip placed following ultrasound guided biopsy of LEFT breast at 3 o'clock.    DIAGNOSIS: A. BREAST, LEFT AT 3:00, 8 CM FROM THE NIPPLE; ULTRASOUND-GUIDED CORE NEEDLE BIOPSY: - BENIGN MAMMARY PARENCHYMA WITH DENSE STROMAL FIBROSIS AND PSEUDOANGIOMATOUS STROMAL HYPERPLASIA, FIBROCYSTIC AND APOCRINE CHANGES, COLUMNAR CELL CHANGE WITHOUT ATYPIA, AND PATCHY USUAL DUCTAL HYPERPLASIA. - NEGATIVE FOR ATYPICAL PROLIFERATIVE BREAST DISEASE.  GROSS DESCRIPTION: A. Labeled: Left breast 3:00 8 cm from the nipple Received: Formalin Time/date in fixative: Collected and placed in formalin at 8:56 AM on 03/21/2022 Cold ischemic time: Less than 1 minute Total fixation time: Approximately 8.5 hours Core pieces: 3 plus additional fragments Size: Ranges from 0.6-1.2 cm in length and 0.1 cm in  diameter Description: Tan to yellow cores of fibrofatty tissue Ink color: Black Entirely submitted in 2 cassettes with 3 cores in A1 and additional fragments in A2.  CM 03/21/2022  Final Diagnosis performed by Katherine Mantle, MD.   Electronically signed 03/22/2022 8:20:23AM The electronic signature indicates that the named Attending Pathologist has evaluated the specimen Technical component performed at [facility], [address], [place], [place] [address] Lab: [phone] Dir: Jolene Schimke, MD, MMM  Professional component performed at [facility], [facility], [address] [place], [place], [place] [address] Lab: [phone] Dir: Beryle Quant, MD     PHQ2/9:    04/09/2022   10:02 AM 02/18/2022    1:55 PM 01/02/2022    3:00 PM 12/27/2021   11:01 AM 11/05/2021    3:48 PM  Depression screen PHQ 2/9  Decreased Interest 3 1 1  1 2   Down, Depressed, Hopeless 3 1 1 1 2   PHQ - 2 Score 6 2 2 2 4   Altered sleeping 1 0 1 0 3  Tired,  decreased energy 1 0 1 0 3  Change in appetite 0 0 3 2 0  Feeling bad or failure about yourself  0 1 0 0 0  Trouble concentrating 0 1 3 1 2   Moving slowly or fidgety/restless 0 0 0 0 0  Suicidal thoughts 0 0 0 0 0  PHQ-9 Score 8 4 10 5 12     phq 9 is positive   Fall Risk:    04/09/2022   10:02 AM 02/18/2022    1:54 PM 01/02/2022    2:55 PM 12/27/2021   11:01 AM 11/05/2021    3:47 PM  Fall Risk   Falls in the past year? 0 1 1 1 1   Number falls in past yr: 0 1 1 1 1   Injury with Fall? 0 1 1 1  0  Risk for fall due to : No Fall Risks No Fall Risks No Fall Risks No Fall Risks No Fall Risks  Follow up Falls prevention discussed Falls prevention discussed Falls prevention discussed Falls prevention discussed Falls prevention discussed      Functional Status Survey: Is the patient deaf or have difficulty hearing?: No Does the patient have difficulty seeing, even when wearing glasses/contacts?: No Does the patient have difficulty concentrating, remembering, or making decisions?: Yes Does the patient have difficulty walking or climbing stairs?: Yes Does the patient have difficulty dressing or bathing?: No Does the patient have difficulty doing errands alone such as visiting a doctor's office or shopping?: No    Assessment & Plan  Problem List Items Addressed This Visit     Vitamin D deficiency    Continue supplementation      Moderate recurrent major depression ([place])    Advised to go back to Dr. Shea Evans Referral placed       Relevant Orders   Ambulatory referral to Psychiatry   Intertrigo - Primary    Discussed keeping area cool and dry Use prn hydrocortisone and clotrimazole topically        Bacterial vaginosis    Reassurance for now, and life style modification      Effusion of left knee    Advised her to contact Dr. Posey Pronto and will give her some nsaid's today        Relevant Medications   celecoxib (CELEBREX) 200 MG capsule   Migraine without aura and without status migrainosus, not intractable   Relevant Medications   topiramate (TOPAMAX) 50 MG tablet   celecoxib (CELEBREX) 200 MG capsule   Inflammatory arthritis   Relevant Medications   celecoxib (CELEBREX) 200 MG capsule   Other Visit Diagnoses     Low serum vitamin B12       Relevant Medications   Cyanocobalamin (B-12) 1000 MCG SUBL (Start on 04/10/2022)

## 2022-04-09 ENCOUNTER — Ambulatory Visit (INDEPENDENT_AMBULATORY_CARE_PROVIDER_SITE_OTHER): Payer: Managed Care, Other (non HMO) | Admitting: Family Medicine

## 2022-04-09 ENCOUNTER — Encounter: Payer: Self-pay | Admitting: Family Medicine

## 2022-04-09 VITALS — BP 118/76 | HR 91 | Resp 16 | Ht 63.0 in | Wt 179.0 lb

## 2022-04-09 DIAGNOSIS — F331 Major depressive disorder, recurrent, moderate: Secondary | ICD-10-CM

## 2022-04-09 DIAGNOSIS — E559 Vitamin D deficiency, unspecified: Secondary | ICD-10-CM

## 2022-04-09 DIAGNOSIS — E538 Deficiency of other specified B group vitamins: Secondary | ICD-10-CM

## 2022-04-09 DIAGNOSIS — N760 Acute vaginitis: Secondary | ICD-10-CM | POA: Diagnosis not present

## 2022-04-09 DIAGNOSIS — M25462 Effusion, left knee: Secondary | ICD-10-CM | POA: Diagnosis not present

## 2022-04-09 DIAGNOSIS — B9689 Other specified bacterial agents as the cause of diseases classified elsewhere: Secondary | ICD-10-CM

## 2022-04-09 DIAGNOSIS — G43009 Migraine without aura, not intractable, without status migrainosus: Secondary | ICD-10-CM | POA: Insufficient documentation

## 2022-04-09 DIAGNOSIS — L304 Erythema intertrigo: Secondary | ICD-10-CM | POA: Insufficient documentation

## 2022-04-09 DIAGNOSIS — M1990 Unspecified osteoarthritis, unspecified site: Secondary | ICD-10-CM

## 2022-04-09 MED ORDER — B-12 1000 MCG SL SUBL: 1.0000 | SUBLINGUAL_TABLET | SUBLINGUAL | 1 refills | Status: AC

## 2022-04-09 MED ORDER — CELECOXIB 200 MG PO CAPS: 200.0000 mg | ORAL_CAPSULE | Freq: Two times a day (BID) | ORAL | 0 refills | Status: DC

## 2022-04-09 NOTE — Patient Instructions (Signed)
Clotrimazole 1% mixed with hydrocortisone 1% Powder and cotton bra versus sports bra to prevent the rash

## 2022-04-09 NOTE — Assessment & Plan Note (Signed)
Doing better on Topamax

## 2022-04-09 NOTE — Assessment & Plan Note (Signed)
Reassurance for now, and life style modification

## 2022-04-09 NOTE — Assessment & Plan Note (Signed)
Advised to go back to Dr. Elna Breslow Referral placed

## 2022-04-09 NOTE — Assessment & Plan Note (Signed)
Advised her to contact Dr. Allena Katz and will give her some nsaid's today

## 2022-04-09 NOTE — Assessment & Plan Note (Addendum)
Discussed keeping area cool and dry Use prn hydrocortisone and clotrimazole topically

## 2022-04-09 NOTE — Assessment & Plan Note (Signed)
Continue supplementation  ?

## 2022-04-10 ENCOUNTER — Ambulatory Visit: Payer: Self-pay | Admitting: *Deleted

## 2022-04-10 NOTE — Telephone Encounter (Signed)
Summary: discuss medication   Patient states she was directed to take hydroxychloroquine and celecoxib (CELEBREX) 200 MG capsule but forgot how she was directed take it (every other day, alternate etc.)   Please follow up w/ patient      Reason for Disposition  [1] Caller has URGENT medicine question about med that PCP or specialist prescribed AND [2] triager unable to answer question  Answer Assessment - Initial Assessment Questions 1. NAME of MEDICATION: "What medicine are you calling about?"     Celebrex and hydroxychloroquine  2. QUESTION: "What is your question?" (e.g., double dose of medicine, side effect)     Patient can not remember instructions for taking these medications together 3. PRESCRIBING HCP: "Who prescribed it?" Reason: if prescribed by specialist, call should be referred to that group.     PCP/Rheumatology  Hydroxychloroquine is not on current medication list- no notes regarding how to take these medications together . Question sent for provider review.  Protocols used: Medication Question Call-A-AH

## 2022-04-10 NOTE — Telephone Encounter (Signed)
Gave patient clarified instructions. Pt gave verbal understanding.

## 2022-04-12 ENCOUNTER — Other Ambulatory Visit
Admission: RE | Admit: 2022-04-12 | Discharge: 2022-04-12 | Disposition: A | Discharge status: Home / Self Care | Payer: Managed Care, Other (non HMO) | Source: Ambulatory Visit | Attending: Rheumatology | Admitting: Rheumatology

## 2022-04-12 DIAGNOSIS — M0609 Rheumatoid arthritis without rheumatoid factor, multiple sites: Secondary | ICD-10-CM | POA: Insufficient documentation

## 2022-04-12 DIAGNOSIS — M25462 Effusion, left knee: Secondary | ICD-10-CM | POA: Diagnosis present

## 2022-04-12 LAB — SYNOVIAL CELL COUNT + DIFF, W/ CRYSTALS
Crystals, Fluid: NONE SEEN
Eosinophils-Synovial: 0 %
Lymphocytes-Synovial Fld: 14 %
Monocyte-Macrophage-Synovial Fluid: 3 %
Neutrophil, Synovial: 83 %
WBC, Synovial: 15814 /mm3 — ABNORMAL HIGH (ref 0–200)

## 2022-04-21 ENCOUNTER — Other Ambulatory Visit: Payer: Self-pay | Admitting: Family Medicine

## 2022-04-21 DIAGNOSIS — M25462 Effusion, left knee: Secondary | ICD-10-CM

## 2022-05-04 ENCOUNTER — Other Ambulatory Visit: Payer: Self-pay | Admitting: Internal Medicine

## 2022-05-04 DIAGNOSIS — E559 Vitamin D deficiency, unspecified: Secondary | ICD-10-CM

## 2022-05-06 NOTE — Telephone Encounter (Signed)
Requested medication (s) are due for refill today: Yes  Requested medication (s) are on the active medication list: Yes  Last refill:  01/29/22  Future visit scheduled: Yes  Notes to clinic:  See request.    Requested Prescriptions  Pending Prescriptions Disp Refills   Vitamin D, Ergocalciferol, (DRISDOL) 1.25 MG (50000 UNIT) CAPS capsule [Pharmacy Med Name: VITAMIN D2 50,000IU (ERGO) CAP RX] 12 capsule 0    Sig: TAKE 1 CAPSULE BY MOUTH EVERY 7 DAYS     Endocrinology:  Vitamins - Vitamin D Supplementation 2 Failed - 05/04/2022  3:30 AM      Failed - Manual Review: Route requests for 50,000 IU strength to the provider      Failed - Vitamin D in normal range and within 360 days    Vit D, 25-Hydroxy  Date Value Ref Range Status  10/31/2021 18.8 (L) 30.0 - 100.0 ng/mL Final    Comment:    Vitamin D deficiency has been defined by the Institute of Medicine and an Endocrine Society practice guideline as a level of serum 25-OH vitamin D less than 20 ng/mL (1,2). The Endocrine Society went on to further define vitamin D insufficiency as a level between 21 and 29 ng/mL (2). 1. IOM (Institute of Medicine). 2010. Dietary reference    intakes for calcium and D. [place]: The    [employer]. 2. Holick MF, Binkley [place], Bischoff-Ferrari HA, et al.    Evaluation, treatment, and prevention of vitamin D    deficiency: an Endocrine Society clinical practice    guideline. JCEM. 2011 Jul; 96(7):1911-30.          Passed - Ca in normal range and within 360 days    Calcium  Date Value Ref Range Status  10/31/2021 9.2 8.7 - 10.2 mg/dL Final   Calcium, Total  Date Value Ref Range Status  08/20/2013 9.1 8.5 - 10.1 mg/dL Final         Passed - Valid encounter within last 12 months    Recent Outpatient Visits           3 weeks ago Intertrigo   [facility] Alba Cory, MD   2 months ago Vaginal discharge   [facility],  Danna Hefty, MD   4 months ago Pain in finger of right hand   [facility] Alba Cory, MD   4 months ago Pain in finger of right hand   [facility] Alba Cory, MD   6 months ago Insulin resistance   [facility] Alba Cory, MD       Future Appointments             In 3 months Carlynn Purl, Danna Hefty, MD [facility]   In 5 months Alba Cory, MD [facility]

## 2022-06-09 ENCOUNTER — Other Ambulatory Visit: Payer: Self-pay | Admitting: Family Medicine

## 2022-07-30 ENCOUNTER — Other Ambulatory Visit: Payer: Self-pay | Admitting: Family Medicine

## 2022-07-30 DIAGNOSIS — E559 Vitamin D deficiency, unspecified: Secondary | ICD-10-CM

## 2022-07-30 NOTE — Telephone Encounter (Signed)
Last seen 6/20

## 2022-08-06 ENCOUNTER — Ambulatory Visit: Payer: Managed Care, Other (non HMO) | Admitting: Family Medicine

## 2022-08-06 ENCOUNTER — Other Ambulatory Visit: Payer: Self-pay | Admitting: Family Medicine

## 2022-08-06 DIAGNOSIS — E88819 Insulin resistance, unspecified: Secondary | ICD-10-CM

## 2022-08-29 ENCOUNTER — Telehealth (INDEPENDENT_AMBULATORY_CARE_PROVIDER_SITE_OTHER): Payer: Managed Care, Other (non HMO) | Admitting: Nurse Practitioner

## 2022-08-29 ENCOUNTER — Encounter: Payer: Self-pay | Admitting: Nurse Practitioner

## 2022-08-29 ENCOUNTER — Other Ambulatory Visit: Payer: Self-pay

## 2022-08-29 DIAGNOSIS — J069 Acute upper respiratory infection, unspecified: Secondary | ICD-10-CM

## 2022-08-29 MED ORDER — BENZONATATE 100 MG PO CAPS
200.0000 mg | ORAL_CAPSULE | Freq: Two times a day (BID) | ORAL | 0 refills | Status: DC | PRN
Start: 2022-08-29 — End: 2022-09-17

## 2022-08-29 MED ORDER — PREDNISONE 10 MG (21) PO TBPK: ORAL_TABLET | ORAL | 0 refills | Status: DC

## 2022-08-29 MED ORDER — PROMETHAZINE-DM 6.25-15 MG/5ML PO SYRP: 5.0000 mL | ORAL_SOLUTION | Freq: Four times a day (QID) | ORAL | 0 refills | Status: DC | PRN

## 2022-08-29 NOTE — Progress Notes (Signed)
Name: Virginia Walls   MRN: [id number]    DOB: 1981-08-04   Date:08/29/2022       Progress Note  Subjective  Chief Complaint  Chief Complaint  Patient presents with   Cough    Chills, runny nosey, stuffy, yellow mucous, sore throat for 3 days. Covid test negative    I connected with  Virginia Walls  on 08/29/22 at  2:20 PM EST by a video enabled telemedicine application and verified that I am speaking with the correct person using two identifiers.  I discussed the limitations of evaluation and management by telemedicine and the availability of in person appointments. The patient expressed understanding and agreed to proceed with a virtual visit  Staff also discussed with the patient that there may be a patient responsible charge related to this service. Patient Location: home Provider Location: [facility] Additional Individuals present: alone  HPI  URI:  Patient reports that symptoms started on Tuesday.  She reports nasal congestion, cough, feverish, chills.  She says she did a home covid test today and it was negative.  She denies any shortness of breath. She says she does have a bad cough making it hard for her to rest.  She also reports lots of sinus congestion causing pain to the right side of her face. Recommend taking zyrtec, flonase, and mucinex. Also can take tylenol or ibuprofen for body aches and fever. Will send in prescription for tessalon perls, steroid taper, phenergan-dm.      Patient Active Problem List   Diagnosis Date Noted   Intertrigo 04/09/2022   Bacterial vaginosis 04/09/2022   Effusion of left knee 04/09/2022   Migraine without aura and without status migrainosus, not intractable 04/09/2022   Inflammatory arthritis 02/25/2022   Noncompliance with medication regimen 01/16/2021   Moderate recurrent major depression ([facility]) 04/11/2020   GAD (generalized anxiety disorder) 04/11/2020   Bereavement 04/11/2020   At risk for prolonged QT interval syndrome 04/11/2020   High  risk medication use 04/11/2020   Chronic constipation 08/12/2017   Hyperglycemia 05/04/2015   Tattoos 05/04/2015   Headache, migraine 04/29/2015   Restless leg 04/29/2015   Snores 04/29/2015   Vitamin D deficiency 04/29/2015    Social History   Tobacco Use   Smoking status: Some Days    Packs/day: 0.25    Years: 4.00    Total pack years: 1.00    Types: Cigarettes    Start date: 05/04/2011    Last attempt to quit: 07/21/2020    Years since quitting: 2.1   Smokeless tobacco: Never   Tobacco comments:    She recently joined a quit program at work, smoking at most 3 cigarettes per day now   Substance Use Topics   Alcohol use: Yes    Alcohol/week: 0.0 standard drinks of alcohol    Comment: occasionally     Current Outpatient Medications:    celecoxib (CELEBREX) 200 MG capsule, Take 1 capsule (200 mg total) by mouth 2 (two) times daily., Disp: 30 capsule, Rfl: 0   Cyanocobalamin (B-12) 1000 MCG SUBL, Place 1 tablet under the tongue 3 (three) times a week., Disp: 100 tablet, Rfl: 1   metFORMIN (GLUCOPHAGE-XR) 500 MG 24 hr tablet, TAKE 1 TABLET(500 MG) BY MOUTH DAILY WITH BREAKFAST, Disp: 90 tablet, Rfl: 0   Rimegepant Sulfate (NURTEC) 75 MG TBDP, Take 1 tablet by mouth daily as needed. Max of 2 in 24 hours, Disp: 16 tablet, Rfl: 2   topiramate (TOPAMAX) 50 MG tablet, TAKE  1 TO 2 TABLETS(50 TO 100 MG) BY MOUTH AT BEDTIME AS NEEDED, Disp: 180 tablet, Rfl: 0   Vitamin D, Ergocalciferol, (DRISDOL) 1.25 MG (50000 UNIT) CAPS capsule, TAKE 1 CAPSULE BY MOUTH EVERY 7 DAYS, Disp: 12 capsule, Rfl: 0  Allergies  Allergen Reactions   Corylus    Hydrocodone     I personally reviewed active problem list, medication list, allergies with the patient/caregiver today.  ROS  Constitutional: positive for fever , negative for weight change.  HEENT: Positive for nasal congestion, facial pain Respiratory: Positive for cough and negative shortness of breath.   Cardiovascular: Negative for chest  pain or palpitations.  Gastrointestinal: Negative for abdominal pain, no bowel changes.  Musculoskeletal: Negative for gait problem or joint swelling.  Skin: Negative for rash.  Neurological: Negative for dizziness or headache.  No other specific complaints in a complete review of systems (except as listed in HPI above).   Objective  Virtual encounter, vitals not obtained.  There is no height or weight on file to calculate BMI.  Nursing Note and Vital Signs reviewed.  Physical Exam  Awake, alert and oriented speaking in complete sentences  No results found for this or any previous visit (from the past 72 hour(s)).  Assessment & Plan  1. Viral upper respiratory tract infection Push fluids get rest Start taking Zyrtec, Flonase, Mucinex Take Tylenol or ibuprofen for fever and body aches - predniSONE (STERAPRED UNI-PAK 21 TAB) 10 MG (21) TBPK tablet; Take as directed on package.  (60 mg po on day 1, 50 mg po on day 2...)  Dispense: 21 tablet; Refill: 0 - promethazine-dextromethorphan (PROMETHAZINE-DM) 6.25-15 MG/5ML syrup; Take 5 mLs by mouth 4 (four) times daily as needed for cough.  Dispense: 118 mL; Refill: 0 - benzonatate (TESSALON) 100 MG capsule; Take 2 capsules (200 mg total) by mouth 2 (two) times daily as needed for cough.  Dispense: 20 capsule; Refill: 0   -Red flags and when to present for emergency care or RTC including fever >101.4F, chest pain, shortness of breath, new/worsening/un-resolving symptoms,  reviewed with patient at time of visit. Follow up and care instructions discussed and provided in AVS. - I discussed the assessment and treatment plan with the patient. The patient was provided an opportunity to ask questions and all were answered. The patient agreed with the plan and demonstrated an understanding of the instructions.  I provided 15 minutes of non-face-to-face time during this encounter.  Bo Merino, FNP

## 2022-08-29 NOTE — Addendum Note (Signed)
Addended by: Della Goo F on: 08/29/2022 02:36 PM   Modules accepted: Level of Service

## 2022-09-02 ENCOUNTER — Ambulatory Visit: Payer: Self-pay | Admitting: *Deleted

## 2022-09-02 NOTE — Telephone Encounter (Signed)
Summary: symptoms not improving from URI   Pts medication is not helping her feel better / her cough really hurts and she has not slept well in 4 days / havent had a bowel movement / after blowing her nose last night she started having a nose bleed / she goes from hot to cold and is still sweating / pt started taking meds for treatment since last week Thursday & Friday / please advise         Chief Complaint: cough not better, requesting if additional medication needed.  Symptoms: cough , blowing nose for white thick mucus. Nose bleed last night used nasal spray and helped with nose bleed. Cough medication at night does not help with sleep. Awake all night not slept in 4 days. Sweating episodes at night soaks through shirts at night. No BM since Thursday . Recommended fluids. Drinking warm fluids. OTC medications. Reports drinking warm green tea Frequency: since last week  Pertinent Negatives: Patient denies chest pain no difficulty breathing unknown temp. Disposition: [] ED /[] Urgent Care (no appt availability in office) / [] Appointment(In office/virtual)/ []  [facility]/ [] Home Care/ [] Refused Recommended Disposition /[] [facility]/ [x]  Follow-up with PCP Additional Notes:   Please advise if another appt needed. Please advise if additional medication needed. Cough medication not helping at night. Patient does not check blood glucose. Still taking prednisone pak and tessalon help cough some.       Reason for Disposition  Cough  Answer Assessment - Initial Assessment Questions 1. ONSET: "When did the cough begin?"      Since last week  2. SEVERITY: "How bad is the cough today?"      Same  3. SPUTUM: "Describe the color of your sputum" (none, dry cough; clear, white, yellow, green)     Thick white , blowing nose thick white mucus. Nose bleed noted last night . 4. HEMOPTYSIS: "Are you coughing up any blood?" If so ask: "How much?" (flecks, streaks, tablespoons,  etc.)     No blowing nose and noted blood small amount used nasal spray with relief 5. DIFFICULTY BREATHING: "Are you having difficulty breathing?" If Yes, ask: "How bad is it?" (e.g., mild, moderate, severe)    - MILD: No SOB at rest, mild SOB with walking, speaks normally in sentences, can lie down, no retractions, pulse < 100.    - MODERATE: SOB at rest, SOB with minimal exertion and prefers to sit, cannot lie down flat, speaks in phrases, mild retractions, audible wheezing, pulse 100-120.    - SEVERE: Very SOB at rest, speaks in single words, struggling to breathe, sitting hunched forward, retractions, pulse > 120      No difficulty breathing. Not able to sleep sweating episodes at night soaking shirts.  6. FEVER: "Do you have a fever?" If Yes, ask: "What is your temperature, how was it measured, and when did it start?"     unknown 7. CARDIAC HISTORY: "Do you have any history of heart disease?" (e.g., heart attack, congestive heart failure)      na 8. LUNG HISTORY: "Do you have any history of lung disease?"  (e.g., pulmonary embolus, asthma, emphysema)     na 9. PE RISK FACTORS: "Do you have a history of blood clots?" (or: recent major surgery, recent prolonged travel, bedridden)     na 10. OTHER SYMPTOMS: "Do you have any other symptoms?" (e.g., runny nose, wheezing, chest pain)       Cough runny nose. Sweating episodes at night  soaking through shirts no BM x 4 days  11. PREGNANCY: "Is there any chance you are pregnant?" "When was your last menstrual period?"       na 12. TRAVEL: "Have you traveled out of the country in the last month?" (e.g., travel history, exposures)       na  Protocols used: Cough - Acute Productive-A-AH

## 2022-09-02 NOTE — Telephone Encounter (Signed)
  Pts medication is not helping her feel better / her cough really hurts and she has not slept well in 4 days / havent had a bowel movement / after blowing her nose last night she started having a nose bleed / she goes from hot to cold and is still sweating / pt started taking meds for treatment since last week Thursday & Friday / please advise

## 2022-09-03 ENCOUNTER — Ambulatory Visit (INDEPENDENT_AMBULATORY_CARE_PROVIDER_SITE_OTHER): Payer: Managed Care, Other (non HMO) | Admitting: Family Medicine

## 2022-09-03 ENCOUNTER — Encounter: Payer: Self-pay | Admitting: Family Medicine

## 2022-09-03 ENCOUNTER — Ambulatory Visit
Admission: RE | Admit: 2022-09-03 | Discharge: 2022-09-03 | Disposition: A | Discharge status: Home / Self Care | Payer: Managed Care, Other (non HMO) | Attending: Family Medicine | Admitting: Family Medicine

## 2022-09-03 ENCOUNTER — Ambulatory Visit
Admission: RE | Admit: 2022-09-03 | Discharge: 2022-09-03 | Disposition: A | Discharge status: Home / Self Care | Payer: Managed Care, Other (non HMO) | Source: Ambulatory Visit | Attending: Family Medicine

## 2022-09-03 VITALS — BP 126/74 | HR 92 | Temp 98.1°F | Resp 16 | Ht 63.0 in | Wt 169.9 lb

## 2022-09-03 DIAGNOSIS — J9801 Acute bronchospasm: Secondary | ICD-10-CM | POA: Insufficient documentation

## 2022-09-03 DIAGNOSIS — J069 Acute upper respiratory infection, unspecified: Secondary | ICD-10-CM

## 2022-09-03 DIAGNOSIS — R062 Wheezing: Secondary | ICD-10-CM | POA: Diagnosis not present

## 2022-09-03 DIAGNOSIS — R0689 Other abnormalities of breathing: Secondary | ICD-10-CM

## 2022-09-03 MED ORDER — ALBUTEROL SULFATE HFA 108 (90 BASE) MCG/ACT IN AERS: 2.0000 | INHALATION_SPRAY | Freq: Four times a day (QID) | RESPIRATORY_TRACT | 0 refills | Status: DC | PRN

## 2022-09-03 MED ORDER — ALBUTEROL SULFATE (2.5 MG/3ML) 0.083% IN NEBU
2.5000 mg | INHALATION_SOLUTION | Freq: Once | RESPIRATORY_TRACT | Status: AC
  Administered 2022-09-03: 2.5 mg via RESPIRATORY_TRACT

## 2022-09-03 NOTE — Patient Instructions (Addendum)
Continue other supportive and symptomatic treatment - push fluids, rest, take tylenol and ibuprofen as directed on bottle/box for fever and pain  I strongly recommend you start a daily antihistamine (like zyrtec, claritin, allegra or xyzal) and a daily or every other day steroid nasal sprays ( for your swollen nasal tissue which looks like you have underlying allergies.  I would still use saline nasal spray to help right now with this infection and the bleeding.  You can continue the mucinex and over the counter cough medications.  Use the inhaler when you have coughing fits or shortness of breath.  It tends to happen more in the morning and when going to bed so do 2 puffs then.

## 2022-09-03 NOTE — Telephone Encounter (Signed)
Pt called back.  She is still feeling poorly. She has a lot of mucous, chills, coughing. And she had blood on tissue when clearing her nose.  PT states the hot and cold is not day and night. Temperature is 98.1.  Made appt for today.

## 2022-09-03 NOTE — Progress Notes (Unsigned)
Patient ID: Virginia Walls, female    DOB: [birth year], 41 y.o.   MRN: [id number]  PCP: Alba Cory, MD  Chief Complaint  Patient presents with   Follow-up    URI not doing better   Cough   Epistaxis    Yesterday and today    Subjective:   Virginia Walls is a 41 y.o. female, presents to clinic with CC of the following:  HPI  URI sx started a week ago with URI sx, hot flashes, sweats, nasal drainage, productive cough - still blowing a lot of mucous out of her nose some bleeding was on one side and then did switch to the other. Not sleeping, not feeling much better  No hx of pneumonia, asthma or bronchitis  Coughing fits with deep breathing, no pain with breathing or severe SOB but still hot cold chills, sweats more productive cough She started to get a burning fire sensation all over her body as well - the [facility] RN said it may be some of the medications she's taking so she stopped promethazine-DM, still doing OTC meds - mucinex, delsym, she is also finishing a steroid taper     Patient Active Problem List   Diagnosis Date Noted   Intertrigo 04/09/2022   Bacterial vaginosis 04/09/2022   Effusion of left knee 04/09/2022   Migraine without aura and without status migrainosus, not intractable 04/09/2022   Inflammatory arthritis 02/25/2022   Noncompliance with medication regimen 01/16/2021   Moderate recurrent major depression (HCC) 04/11/2020   GAD (generalized anxiety disorder) 04/11/2020   Bereavement 04/11/2020   At risk for prolonged QT interval syndrome 04/11/2020   High risk medication use 04/11/2020   Chronic constipation 08/12/2017   Hyperglycemia 05/04/2015   Tattoos 05/04/2015   Headache, migraine 04/29/2015   Restless leg 04/29/2015   Snores 04/29/2015   Vitamin D deficiency 04/29/2015      Current Outpatient Medications:    benzonatate (TESSALON) 100 MG capsule, Take 2 capsules (200 mg total) by mouth 2 (two) times daily as needed for cough., Disp: 20  capsule, Rfl: 0   celecoxib (CELEBREX) 200 MG capsule, Take 1 capsule (200 mg total) by mouth 2 (two) times daily., Disp: 30 capsule, Rfl: 0   Cyanocobalamin (B-12) 1000 MCG SUBL, Place 1 tablet under the tongue 3 (three) times a week., Disp: 100 tablet, Rfl: 1   metFORMIN (GLUCOPHAGE-XR) 500 MG 24 hr tablet, TAKE 1 TABLET(500 MG) BY MOUTH DAILY WITH BREAKFAST, Disp: 90 tablet, Rfl: 0   predniSONE (STERAPRED UNI-PAK 21 TAB) 10 MG (21) TBPK tablet, Take as directed on package.  (60 mg po on day 1, 50 mg po on day 2...), Disp: 21 tablet, Rfl: 0   Rimegepant Sulfate (NURTEC) 75 MG TBDP, Take 1 tablet by mouth daily as needed. Max of 2 in 24 hours, Disp: 16 tablet, Rfl: 2   topiramate (TOPAMAX) 50 MG tablet, TAKE 1 TO 2 TABLETS(50 TO 100 MG) BY MOUTH AT BEDTIME AS NEEDED, Disp: 180 tablet, Rfl: 0   Vitamin D, Ergocalciferol, (DRISDOL) 1.25 MG (50000 UNIT) CAPS capsule, TAKE 1 CAPSULE BY MOUTH EVERY 7 DAYS, Disp: 12 capsule, Rfl: 0   promethazine-dextromethorphan (PROMETHAZINE-DM) 6.25-15 MG/5ML syrup, Take 5 mLs by mouth 4 (four) times daily as needed for cough. (Patient not taking: Reported on 09/03/2022), Disp: 118 mL, Rfl: 0   Allergies  Allergen Reactions   Corylus    Hydrocodone      Social History   Tobacco Use  Smoking status: Some Days    Packs/day: 0.25    Years: 4.00    Total pack years: 1.00    Types: Cigarettes    Start date: 05/04/2011    Last attempt to quit: 07/21/2020    Years since quitting: 2.1   Smokeless tobacco: Never   Tobacco comments:    She recently joined a quit program at work, smoking at most 3 cigarettes per day now   Vaping Use   Vaping Use: Never used  Substance Use Topics   Alcohol use: Yes    Alcohol/week: 0.0 standard drinks of alcohol    Comment: occasionally   Drug use: No      Chart Review Today: I personally reviewed active problem list, medication list, allergies, family history, social history, health maintenance, notes from last  encounter, lab results, imaging with the patient/caregiver today.   Review of Systems  Constitutional: Negative.   HENT: Negative.    Eyes: Negative.   Respiratory: Negative.    Cardiovascular: Negative.   Gastrointestinal: Negative.   Endocrine: Negative.   Genitourinary: Negative.   Musculoskeletal: Negative.   Skin: Negative.   Allergic/Immunologic: Negative.   Neurological: Negative.   Hematological: Negative.   Psychiatric/Behavioral: Negative.    All other systems reviewed and are negative.      Objective:   Vitals:   09/03/22 1111  BP: 126/74  Pulse: 92  Resp: 16  Temp: 98.1 F (36.7 C)  TempSrc: Oral  SpO2: 99%  Weight: 169 lb 14.4 oz (77.1 kg)  Height: 5\' 3"  (1.6 m)    Body mass index is 30.1 kg/m.  Physical Exam Vitals and nursing note reviewed.  Constitutional:      General: She is not in acute distress.    Appearance: Normal appearance. She is well-developed and overweight. She is not ill-appearing, toxic-appearing or diaphoretic.  HENT:     Head: Normocephalic and atraumatic.     Right Ear: Hearing, tympanic membrane, ear canal and external ear normal.     Left Ear: Hearing, tympanic membrane, ear canal and external ear normal.     Nose: Mucosal edema, congestion and rhinorrhea present.     Right Turbinates: Swollen.     Left Turbinates: Swollen.     Right Sinus: No maxillary sinus tenderness or frontal sinus tenderness.     Left Sinus: No maxillary sinus tenderness or frontal sinus tenderness.     Mouth/Throat:     Mouth: Mucous membranes are moist. Mucous membranes are not pale.     Pharynx: Oropharynx is clear. Uvula midline. No pharyngeal swelling, oropharyngeal exudate or uvula swelling.     Tonsils: No tonsillar exudate or tonsillar abscesses.  Eyes:     General:        Right eye: No discharge.        Left eye: No discharge.     Conjunctiva/sclera: Conjunctivae normal.     Pupils: Pupils are equal, round, and reactive to light.  Neck:      Trachea: No tracheal deviation.  Cardiovascular:     Rate and Rhythm: Normal rate and regular rhythm.     Pulses: Normal pulses.     Heart sounds: Normal heart sounds.  Pulmonary:     Effort: Pulmonary effort is normal. No tachypnea, accessory muscle usage, respiratory distress or retractions.     Breath sounds: No stridor. Examination of the right-lower field reveals decreased breath sounds. Examination of the left-lower field reveals decreased breath sounds. Decreased breath sounds, wheezing and rhonchi  present.     Comments: Severe coughing fits with any deep inspiration, very diminishes BS b/l at the bases and inspiratory and expiratory wheeze and squeaks with bilateral mid to lower lung fields Scattered rhonchi and a lot of congestion mid to lower lung R>L Abdominal:     General: Bowel sounds are normal. There is no distension.     Palpations: Abdomen is soft.  Musculoskeletal:     Cervical back: Normal range of motion and neck supple.  Lymphadenopathy:     Cervical: No cervical adenopathy.  Skin:    General: Skin is warm and dry.     Coloration: Skin is not pale.     Findings: No rash.  Neurological:     Mental Status: She is alert.     Motor: No abnormal muscle tone.     Coordination: Coordination normal.  Psychiatric:        Behavior: Behavior normal. Behavior is cooperative.      Results for orders placed or performed during the hospital encounter of 04/12/22  Synovial cell count + diff, w/ crystals  Result Value Ref Range   Color, Synovial YELLOW YELLOW   Appearance-Synovial CLOUDY (A) CLEAR   Crystals, Fluid NO CRYSTALS SEEN    WBC, Synovial 15,814 (H) 0 - 200 /cu mm   Neutrophil, Synovial 83 %   Lymphocytes-Synovial Fld 14 %   Monocyte-Macrophage-Synovial Fluid 3 %   Eosinophils-Synovial 0 %       Assessment & Plan:     1. Upper respiratory tract infection, unspecified type Pt had onset of sx last week, did virtual visit, had tested negative for  COVID, sx ongoing about a week now She was given cough meds and steroids and she has not improved - fatigue, sweats, severe wet productive cough No fever She feels overall worse and she was concerned with med SE/interactions - having some burning skin sensation On exam she has difficulty with taking a normal breath and a deep breath causing severe bronchospasms and coughing fits, breath sounds are very diminished and tight and congested sounding She was given a breathing treatment with albuterol in clinic and reassessed afterwards She was able to take a deeper breath and breath sounds improved dramatically in the mid lung fields bilaterally still some diminished breath sounds and inspiratory and expiratory wheeze in bilateral lower lung fields and still some scattered rhonchi versus coarse crackles sent for chest x-ray to assess the need for antibiotics for Versus coverage for atypical pneumonia or just bronchitis She is afebrile is not having any pleuritic chest pain, dyspnea with exertion or decreased oxygen saturation here but she does not have a history of any asthma or bronchitis or pneumonia Encouraged her to continue Mucinex and use an inhaler -rescue inhaler was sent in A concerned that some of her side effects or symptoms may have been because of the prednisone so I hesitate to give her more of that but her lungs may need it with how wheezy she sounds - DG Chest 2 View; Future Chest x-ray pending which will determine if she needs something like doxycycline or Z-Pak versus double coverage abx for CAP  2. Cough due to bronchospasm New following URI for over a week - albuterol (PROVENTIL) (2.5 MG/3ML) 0.083% nebulizer solution 2.5 mg - DG Chest 2 View; Future - albuterol (VENTOLIN HFA) 108 (90 Base) MCG/ACT inhaler; Inhale 2 puffs into the lungs every 6 (six) hours as needed for wheezing or shortness of breath. For acute bronchitis, possibly lower  respiratory infection/CAP  Dispense: 8 g;  Refill: 0  3. Wheeze Improved with neb tx - she is currently on steroids has one more day - albuterol (PROVENTIL) (2.5 MG/3ML) 0.083% nebulizer solution 2.5 mg - DG Chest 2 View; Future - albuterol (VENTOLIN HFA) 108 (90 Base) MCG/ACT inhaler; Inhale 2 puffs into the lungs every 6 (six) hours as needed for wheezing or shortness of breath. For acute bronchitis, possibly lower respiratory infection/CAP  Dispense: 8 g; Refill: 0  4. Abnormal breath sounds - DG Chest 2 View; Future   Pts nasal mucosa was very swollen and erythematous and looks very irritated did encourage her to do some antihistamine medications, and intranasal steroid spray and if that irritates her to do it every other day and continue to do the saline nasal spray She did not have significant sinus tenderness on palpation -so the time of exam I would not really concerned for acute bacterial sinusitis  Patient required a work note today  It seems that she has to do FMLA paperwork for any illnesses this was done previously but she is not sure if it was filled out right and it will need to be addended because of her continued illness   Danelle Berry, PA-C 09/03/22 11:45 AM

## 2022-09-04 ENCOUNTER — Encounter: Payer: Self-pay | Admitting: Family Medicine

## 2022-09-04 MED ORDER — DOXYCYCLINE HYCLATE 100 MG PO TABS: 100.0000 mg | ORAL_TABLET | Freq: Two times a day (BID) | ORAL | 0 refills | Status: AC

## 2022-09-09 ENCOUNTER — Ambulatory Visit: Payer: Managed Care, Other (non HMO) | Admitting: Family Medicine

## 2022-09-09 ENCOUNTER — Encounter: Payer: Self-pay | Admitting: Family Medicine

## 2022-09-09 VITALS — BP 108/76 | HR 104 | Temp 98.0°F | Resp 16 | Ht 63.0 in | Wt 172.2 lb

## 2022-09-09 DIAGNOSIS — J209 Acute bronchitis, unspecified: Secondary | ICD-10-CM | POA: Diagnosis not present

## 2022-09-09 NOTE — Progress Notes (Signed)
Patient ID: Virginia Walls, female    DOB: 02-08-1981, 41 y.o.   MRN: [id number]  PCP: Alba Cory, MD  Chief Complaint  Patient presents with   FMLA   Follow-up    Still coughing up mucus    Subjective:   Virginia Walls is a 41 y.o. female, presents to clinic with CC of the following:  HPI  Still some sweats coughing and productive mucous is getting a little better  Coughing fits and SOB much better She has a few days of abx left FMLA paperwork present - will complete today    Patient Active Problem List   Diagnosis Date Noted   Intertrigo 04/09/2022   Bacterial vaginosis 04/09/2022   Effusion of left knee 04/09/2022   Migraine without aura and without status migrainosus, not intractable 04/09/2022   Inflammatory arthritis 02/25/2022   Noncompliance with medication regimen 01/16/2021   Moderate recurrent major depression (HCC) 04/11/2020   GAD (generalized anxiety disorder) 04/11/2020   Bereavement 04/11/2020   At risk for prolonged QT interval syndrome 04/11/2020   High risk medication use 04/11/2020   Chronic constipation 08/12/2017   Hyperglycemia 05/04/2015   Tattoos 05/04/2015   Headache, migraine 04/29/2015   Restless leg 04/29/2015   Snores 04/29/2015   Vitamin D deficiency 04/29/2015      Current Outpatient Medications:    albuterol (VENTOLIN HFA) 108 (90 Base) MCG/ACT inhaler, Inhale 2 puffs into the lungs every 6 (six) hours as needed for wheezing or shortness of breath. For acute bronchitis, possibly lower respiratory infection/CAP, Disp: 8 g, Rfl: 0   benzonatate (TESSALON) 100 MG capsule, Take 2 capsules (200 mg total) by mouth 2 (two) times daily as needed for cough., Disp: 20 capsule, Rfl: 0   celecoxib (CELEBREX) 200 MG capsule, Take 1 capsule (200 mg total) by mouth 2 (two) times daily., Disp: 30 capsule, Rfl: 0   Cyanocobalamin (B-12) 1000 MCG SUBL, Place 1 tablet under the tongue 3 (three) times a week., Disp: 100 tablet, Rfl: 1    doxycycline (VIBRA-TABS) 100 MG tablet, Take 1 tablet (100 mg total) by mouth 2 (two) times daily for 7 days. Take on full stomach or with food, drink full glass of water after and sit upright for at least 30 min, Disp: 14 tablet, Rfl: 0   metFORMIN (GLUCOPHAGE-XR) 500 MG 24 hr tablet, TAKE 1 TABLET(500 MG) BY MOUTH DAILY WITH BREAKFAST, Disp: 90 tablet, Rfl: 0   Rimegepant Sulfate (NURTEC) 75 MG TBDP, Take 1 tablet by mouth daily as needed. Max of 2 in 24 hours, Disp: 16 tablet, Rfl: 2   topiramate (TOPAMAX) 50 MG tablet, TAKE 1 TO 2 TABLETS(50 TO 100 MG) BY MOUTH AT BEDTIME AS NEEDED, Disp: 180 tablet, Rfl: 0   Vitamin D, Ergocalciferol, (DRISDOL) 1.25 MG (50000 UNIT) CAPS capsule, TAKE 1 CAPSULE BY MOUTH EVERY 7 DAYS, Disp: 12 capsule, Rfl: 0   predniSONE (STERAPRED UNI-PAK 21 TAB) 10 MG (21) TBPK tablet, Take as directed on package.  (60 mg po on day 1, 50 mg po on day 2...) (Patient not taking: Reported on 09/09/2022), Disp: 21 tablet, Rfl: 0   Allergies  Allergen Reactions   Corylus    Hydrocodone      Social History   Tobacco Use   Smoking status: Some Days    Packs/day: 0.25    Years: 4.00    Total pack years: 1.00    Types: Cigarettes    Start date: 05/04/2011  Last attempt to quit: 07/21/2020    Years since quitting: 2.1   Smokeless tobacco: Never   Tobacco comments:    She recently joined a quit program at work, smoking at most 3 cigarettes per day now   Vaping Use   Vaping Use: Never used  Substance Use Topics   Alcohol use: Yes    Alcohol/week: 0.0 standard drinks of alcohol    Comment: occasionally   Drug use: No      Chart Review Today: I personally reviewed active problem list, medication list, allergies, family history, social history, health maintenance, notes from last encounter, lab results, imaging with the patient/caregiver today.   Review of Systems  Constitutional: Negative.   HENT: Negative.    Eyes: Negative.   Respiratory: Negative.     Cardiovascular: Negative.   Gastrointestinal: Negative.   Endocrine: Negative.   Genitourinary: Negative.   Musculoskeletal: Negative.   Skin: Negative.   Allergic/Immunologic: Negative.   Neurological: Negative.   Hematological: Negative.   Psychiatric/Behavioral: Negative.    All other systems reviewed and are negative.      Objective:   Vitals:   09/09/22 0937 09/09/22 0938  BP: 108/76   Pulse: (!) 125 (!) 104  Resp: 16   Temp: 98 F (36.7 C)   TempSrc: Oral   SpO2: 100%   Weight: 172 lb 3.2 oz (78.1 kg)   Height: 5\' 3"  (1.6 m)     Body mass index is 30.5 kg/m.  Physical Exam Vitals and nursing note reviewed.  Constitutional:      General: She is not in acute distress.    Appearance: Normal appearance. She is well-developed. She is not ill-appearing, toxic-appearing or diaphoretic.  HENT:     Head: Normocephalic and atraumatic.     Nose: Nose normal.  Eyes:     General:        Right eye: No discharge.        Left eye: No discharge.     Conjunctiva/sclera: Conjunctivae normal.  Neck:     Trachea: No tracheal deviation.  Cardiovascular:     Rate and Rhythm: Normal rate and regular rhythm.     Pulses: Normal pulses.     Heart sounds: Normal heart sounds. No murmur heard.    No friction rub. No gallop.  Pulmonary:     Effort: Pulmonary effort is normal. No respiratory distress.     Breath sounds: No stridor. Rhonchi present. No wheezing or rales.  Musculoskeletal:        General: Normal range of motion.  Skin:    General: Skin is warm and dry.     Findings: No rash.  Neurological:     Mental Status: She is alert.     Motor: No abnormal muscle tone.     Coordination: Coordination normal.  Psychiatric:        Behavior: Behavior normal.      Results for orders placed or performed during the hospital encounter of 04/12/22  Synovial cell count + diff, w/ crystals  Result Value Ref Range   Color, Synovial YELLOW YELLOW   Appearance-Synovial CLOUDY  (A) CLEAR   Crystals, Fluid NO CRYSTALS SEEN    WBC, Synovial 15,814 (H) 0 - 200 /cu mm   Neutrophil, Synovial 83 %   Lymphocytes-Synovial Fld 14 %   Monocyte-Macrophage-Synovial Fluid 3 %   Eosinophils-Synovial 0 %       Assessment & Plan:     ICD-10-CM   1. Acute bronchitis, unspecified  organism  J20.9    complete meds, use inhaler, continue mucinex, f/up if any worsening of sx     At the time of exam pt was well appearing, lungs significantly improved compared to last week, no severe bronchospasm She was not tachycardic at the time of my exam  Paperwork to be done to return her to work after abx are complete Work note given and work forms completed and will be faxed Can return to work in 3 d    Danelle Berry, PA-C 09/09/22 9:55 AM

## 2022-09-16 ENCOUNTER — Telehealth: Payer: Self-pay | Admitting: Family Medicine

## 2022-09-16 NOTE — Telephone Encounter (Signed)
Letter written and faxed.

## 2022-09-16 NOTE — Progress Notes (Deleted)
Name: SAFIRE GORDIN   MRN: [id number]    DOB: 09-19-1981   Date:09/16/2022       Progress Note  Subjective  Chief Complaint  Medication Refill  HPI  BV: treated last month, she states noticing some odor again, discussed BV, wearing cotton underwear , avoid douching. Wear loses clothes.  Inflammatory arthritis  she states 6  months ago she noticed some pain on MCP on 4th middle finger while at work, that resolved by itself but shortly after developed pain and swelling of right fifth finger  while at work. She types all day. No redness or increase in warmth but tender to touch and with movement. She had multiple labs done and only abnormal was sed rate, seen by Rheumatologist , Dr. Allena Katz and diagnosed with inflammatory and started on Plaquenil in 2022/02/12. She states pain on her finger improved however left knee is now swollen, no redness, but painful when walking and also with pressure , symptoms started about 10 days ago and is getting progressively worse.    Migraine: she states pain can start on nuchal area or frontal area and radiates to opposite direction. Described as pounding, sharp associated with dizziness, photophobia, no nausea or vomiting. 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 She was having daily headaches in January , she back to Topamax at night and episodes down to an average of a couple times a month, but had a few episodes last week    Insulin resistance: A1C used to be high at 5.7 %, currently normal, but had elevated fasting glucose, she has changed her diet, eating salads for lunch, more vegetables and smaller portions for dinner,  she is using weight watchers but has gained some weight recently    Grief/Major depression recurrent: she is feeling better, still has bad days but when feeling sad she goes see her grand-daughter, she called psychiatrist but did not get an appointment yet, she is back on medications but phq 9 is higher,  she states this time of the year is very high since her son got work in 2023/01/13 and he died in 2023-02-13 , she has been unable to get a hold of her psychiatrist. We will try placing another referral   Vitamin D def: she  is back on supplementation    Low B12: discussed towards low end of normal, she stopped taking supplementation but will resume it now     Dyslipidemia:    The 10-year ASCVD risk score (Arnett DK, et al., 02/12/18) is: 0.6%   Values used to calculate the score:     Age: 25 years     Sex: Female     Is Non-Hispanic African American: Yes     Diabetic: No     Tobacco smoker: Yes     Systolic Blood Pressure: 118 mmHg     Is BP treated: No     HDL Cholesterol: 66 mg/dL     Total Cholesterol: 231 mg/dL   Patient Active Problem List   Diagnosis Date Noted   Intertrigo 04/09/2022   Bacterial vaginosis 04/09/2022   Effusion of left knee 04/09/2022   Migraine without aura and without status migrainosus, not intractable 04/09/2022   Inflammatory arthritis 02/25/2022   Noncompliance with medication regimen 01/16/2021   Moderate recurrent major depression (HCC) 04/11/2020   GAD (generalized anxiety disorder) 04/11/2020   Bereavement 04/11/2020   At risk for prolonged QT interval syndrome  04/11/2020   High risk medication use 04/11/2020   Chronic constipation 08/12/2017   Hyperglycemia 05/04/2015   Tattoos 05/04/2015   Headache, migraine 04/29/2015   Restless leg 04/29/2015   Snores 04/29/2015   Vitamin D deficiency 04/29/2015    Past Surgical History:  Procedure Laterality Date   ABDOMINAL HYSTERECTOMY     BREAST BIOPSY Left 03/21/2022   US biopsy/ ribbon clip/ path pending   TUBAL LIGATION      Family History  Problem Relation Age of Onset   Heart disease Mother    Asthma Mother    Hypothyroidism Mother    Bipolar disorder Mother    Hypothyroidism Sister    Diabetes Sister    Hyperlipidemia Sister    Bipolar disorder Maternal Aunt    Bipolar disorder Maternal  Uncle    Breast cancer Maternal Grandmother    Heart disease Paternal Grandmother        Pacemaker   Leukemia Paternal Grandmother    Diabetes Paternal Grandmother    Asthma Son     Social History   Tobacco Use   Smoking status: Some Days    Packs/day: 0.25    Years: 4.00    Total pack years: 1.00    Types: Cigarettes    Start date: 05/04/2011    Last attempt to quit: 07/21/2020    Years since quitting: 2.1   Smokeless tobacco: Never   Tobacco comments:    She recently joined a quit program at work, smoking at most 3 cigarettes per day now   Substance Use Topics   Alcohol use: Yes    Alcohol/week: 0.0 standard drinks of alcohol    Comment: occasionally     Current Outpatient Medications:    albuterol (VENTOLIN HFA) 108 (90 Base) MCG/ACT inhaler, Inhale 2 puffs into the lungs every 6 (six) hours as needed for wheezing or shortness of breath. For acute bronchitis, possibly lower respiratory infection/CAP, Disp: 8 g, Rfl: 0   benzonatate (TESSALON) 100 MG capsule, Take 2 capsules (200 mg total) by mouth 2 (two) times daily as needed for cough., Disp: 20 capsule, Rfl: 0   celecoxib (CELEBREX) 200 MG capsule, Take 1 capsule (200 mg total) by mouth 2 (two) times daily., Disp: 30 capsule, Rfl: 0   Cyanocobalamin (B-12) 1000 MCG SUBL, Place 1 tablet under the tongue 3 (three) times a week., Disp: 100 tablet, Rfl: 1   metFORMIN (GLUCOPHAGE-XR) 500 MG 24 hr tablet, TAKE 1 TABLET(500 MG) BY MOUTH DAILY WITH BREAKFAST, Disp: 90 tablet, Rfl: 0   predniSONE (STERAPRED UNI-PAK 21 TAB) 10 MG (21) TBPK tablet, Take as directed on package.  (60 mg po on day 1, 50 mg po on day 2...) (Patient not taking: Reported on 09/09/2022), Disp: 21 tablet, Rfl: 0   Rimegepant Sulfate (NURTEC) 75 MG TBDP, Take 1 tablet by mouth daily as needed. Max of 2 in 24 hours, Disp: 16 tablet, Rfl: 2   topiramate (TOPAMAX) 50 MG tablet, TAKE 1 TO 2 TABLETS(50 TO 100 MG) BY MOUTH AT BEDTIME AS NEEDED, Disp: 180 tablet, Rfl:  0   Vitamin D, Ergocalciferol, (DRISDOL) 1.25 MG (50000 UNIT) CAPS capsule, TAKE 1 CAPSULE BY MOUTH EVERY 7 DAYS, Disp: 12 capsule, Rfl: 0  Allergies  Allergen Reactions   Corylus    Hydrocodone     I personally reviewed active problem list, medication list, allergies, family history, social history, health maintenance with the patient/caregiver today.   ROS  ***  Objective  There were no  vitals filed for this visit.  There is no height or weight on file to calculate BMI.  Physical Exam ***  No results found for this or any previous visit (from the past 2160 hour(s)).   PHQ2/9:    09/09/2022    9:39 AM 09/03/2022   11:10 AM 08/29/2022    2:10 PM 04/09/2022   10:02 AM 02/18/2022    1:55 PM  Depression screen PHQ 2/9  Decreased Interest 2 0 0 3 1  Down, Depressed, Hopeless 2 0 0 3 1  PHQ - 2 Score 4 0 0 6 2  Altered sleeping 2 0  1 0  Tired, decreased energy 2 0  1 0  Change in appetite 0 0  0 0  Feeling bad or failure about yourself  0 0  0 1  Trouble concentrating 0 0  0 1  Moving slowly or fidgety/restless 0 0  0 0  Suicidal thoughts 0 0  0 0  PHQ-9 Score 8 0  8 4  Difficult doing work/chores Somewhat difficult Not difficult at all       phq 9 is {gen pos [id number]}   Fall Risk:    09/09/2022    9:38 AM 09/03/2022   11:10 AM 08/29/2022    2:09 PM 04/09/2022   10:02 AM 02/18/2022    1:54 PM  Fall Risk   Falls in the past year? 0 0 0 0 1  Number falls in past yr: 0 0 0 0 1  Injury with Fall? 0 0 0 0 1  Risk for fall due to : No Fall Risks No Fall Risks  No Fall Risks No Fall Risks  Follow up Falls prevention discussed;Education provided;Falls evaluation completed Falls prevention discussed;Education provided;Falls evaluation completed Falls evaluation completed Falls prevention discussed Falls prevention discussed      Functional Status Survey:      Assessment & Plan  *** There are no diagnoses linked to this encounter.

## 2022-09-16 NOTE — Telephone Encounter (Signed)
Pt states she needs a "written release note to return to work. Even though they got the [facility] paperwork, they need a written release.  Ok to fax to [employer].  Fax:  [phone]

## 2022-09-16 NOTE — Progress Notes (Signed)
Name: Virginia Walls   MRN: [id number]    DOB: 1981/05/30   Date:09/17/2022       Progress Note  Subjective  Chief Complaint  Medication Refill  I connected with  Amie Critchley Desroches  on 09/17/22 at  2:40 PM EST by a video enabled telemedicine application and verified that I am speaking with the correct person using two identifiers.  I discussed the limitations of evaluation and management by telemedicine and the availability of in person appointments. The patient expressed understanding and agreed to proceed with the virtual visit  Staff also discussed with the patient that there may be a patient responsible charge related to this service. Patient Location: at home  Provider Location: [facility] Additional Individuals present: alone   HPI   Rheumatoid arthritis:   she states 6  months ago she noticed some pain on MCP on 4th middle finger while at work, that resolved by itself but shortly after developed pain and swelling of right fifth finger  while at work. She types all day. No redness or increase in warmth but tender to touch and with movement. She had multiple labs done and only abnormal was sed rate, seen by Rheumatologist , Dr. Allena Katz and diagnosed with inflammatory and started on Plaquenil in 01-27-22. Based on his notes she is supposed to be on methotrexate and folic acid . She will contact him to clarify    Migraine: she states pain can start on nuchal area or frontal area and radiates to opposite direction. Described as pounding, sharp associated with dizziness, photophobia, no nausea or vomiting. 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 She was having daily headaches in January , she back to Topamax BID, symptoms worse when sick or lack of sleep and stress   Insulin resistance: she is taking metformin and denies side effects of medication   Grief/Major depression recurrent:  she states this time of the year is very high since her son got sick  in  12/28/22 and he died in 2023/01/28, she was seen by psychiatrist , she will contact her insurance to find out who can see her now . Holidays have been hard . She has not been taking medication but willing to resume Lexapro today    Vitamin D def: she  is back on supplementation    Low B12:she has resumed supplements    Dyslipidemia:    The 10-year ASCVD risk score (Arnett DK, et al., Jan 27, 2018) is: 0.4%   Values used to calculate the score:     Age: 21 years     Sex: Female     Is Non-Hispanic African American: Yes     Diabetic: No     Tobacco smoker: Yes     Systolic Blood Pressure: 108 mmHg     Is BP treated: No     HDL Cholesterol: 66 mg/dL     Total Cholesterol: 231 mg/dL   Patient Active Problem List   Diagnosis Date Noted   Intertrigo 04/09/2022   Bacterial vaginosis 04/09/2022   Effusion of left knee 04/09/2022   Migraine without aura and without status migrainosus, not intractable 04/09/2022   Inflammatory arthritis 02/25/2022   Noncompliance with medication regimen 01/16/2021   Moderate recurrent major depression (HCC) 04/11/2020   GAD (generalized anxiety disorder) 04/11/2020   Bereavement 04/11/2020   At risk for prolonged QT interval syndrome 04/11/2020   High risk medication use 04/11/2020  Chronic constipation 08/12/2017   Hyperglycemia 05/04/2015   Tattoos 05/04/2015   Headache, migraine 04/29/2015   Restless leg 04/29/2015   Snores 04/29/2015   Vitamin D deficiency 04/29/2015    Past Surgical History:  Procedure Laterality Date   ABDOMINAL HYSTERECTOMY     BREAST BIOPSY Left 03/21/2022   US biopsy/ ribbon clip/ path pending   TUBAL LIGATION      Family History  Problem Relation Age of Onset   Heart disease Mother    Asthma Mother    Hypothyroidism Mother    Bipolar disorder Mother    Hypothyroidism Sister    Diabetes Sister    Hyperlipidemia Sister    Bipolar disorder Maternal Aunt    Bipolar disorder Maternal Uncle    Breast cancer Maternal  Grandmother    Heart disease Paternal Grandmother        Pacemaker   Leukemia Paternal Grandmother    Diabetes Paternal Grandmother    Asthma Son     Social History   Socioeconomic History   Marital status: Single    Spouse name: Not on file   Number of children: 3   Years of education: Not on file   Highest education level: Not on file  Occupational History   Occupation: customer service specialist   Tobacco Use   Smoking status: Some Days    Packs/day: 0.25    Years: 4.00    Total pack years: 1.00    Types: Cigarettes    Start date: 05/04/2011    Last attempt to quit: 07/21/2020    Years since quitting: 2.1   Smokeless tobacco: Never   Tobacco comments:    She recently joined a quit program at work, smoking at most 3 cigarettes per day now   Vaping Use   Vaping Use: Never used  Substance and Sexual Activity   Alcohol use: Yes    Alcohol/week: 0.0 standard drinks of alcohol    Comment: occasionally   Drug use: No   Sexual activity: Yes    Partners: Male  Other Topics Concern   Not on file  Social History Narrative   Lives with same person / boyfriend for the past 18 years   She has three son. 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    Youngest sone died at age 66 from Leukemia    Social Determinants of Health   Financial Resource Strain: Low Risk  (10/31/2021)   Overall Financial Resource Strain (CARDIA)    Difficulty of Paying Living Expenses: Not hard at all  Food Insecurity: No Food Insecurity (10/31/2021)   Hunger Vital Sign    Worried About Running Out of Food in the Last Year: Never true    Ran Out of Food in the Last Year: Never true  Transportation Needs: No Transportation Needs (10/31/2021)   PRAPARE - Administrator, Civil Service (Medical): No    Lack of Transportation (Non-Medical): No  Physical Activity: Insufficiently Active (10/31/2021)   Exercise Vital Sign    Days of  Exercise per Week: 2 days    Minutes of Exercise per Session: 20 min  Stress: Stress Concern Present (10/31/2021)   [employer] of Occupational Health - Occupational Stress Questionnaire    Feeling of Stress : Rather much  Social Connections: Moderately Isolated (10/31/2021)   Social Connection and Isolation Panel [NHANES]    Frequency of Communication with Friends and Family: Twice a  week    Frequency of Social Gatherings with Friends and Family: Once a week    Attends Religious Services: Never    Database administrator or Organizations: No    Attends Banker Meetings: Never    Marital Status: Living with partner  Intimate Partner Violence: Not At Risk (10/31/2021)   Humiliation, Afraid, Rape, and Kick questionnaire    Fear of Current or Ex-Partner: No    Emotionally Abused: No    Physically Abused: No    Sexually Abused: No     Current Outpatient Medications:    albuterol (VENTOLIN HFA) 108 (90 Base) MCG/ACT inhaler, Inhale 2 puffs into the lungs every 6 (six) hours as needed for wheezing or shortness of breath. For acute bronchitis, possibly lower respiratory infection/CAP, Disp: 8 g, Rfl: 0   celecoxib (CELEBREX) 200 MG capsule, Take 1 capsule (200 mg total) by mouth 2 (two) times daily., Disp: 30 capsule, Rfl: 0   Cyanocobalamin (B-12) 1000 MCG SUBL, Place 1 tablet under the tongue 3 (three) times a week., Disp: 100 tablet, Rfl: 1   metFORMIN (GLUCOPHAGE-XR) 500 MG 24 hr tablet, TAKE 1 TABLET(500 MG) BY MOUTH DAILY WITH BREAKFAST, Disp: 90 tablet, Rfl: 0   Rimegepant Sulfate (NURTEC) 75 MG TBDP, Take 1 tablet by mouth daily as needed. Max of 2 in 24 hours, Disp: 16 tablet, Rfl: 2   topiramate (TOPAMAX) 50 MG tablet, TAKE 1 TO 2 TABLETS(50 TO 100 MG) BY MOUTH AT BEDTIME AS NEEDED, Disp: 180 tablet, Rfl: 0   Vitamin D, Ergocalciferol, (DRISDOL) 1.25 MG (50000 UNIT) CAPS capsule, TAKE 1 CAPSULE BY MOUTH EVERY 7 DAYS, Disp: 12 capsule, Rfl: 0   benzonatate (TESSALON)  100 MG capsule, Take 2 capsules (200 mg total) by mouth 2 (two) times daily as needed for cough. (Patient not taking: Reported on 09/17/2022), Disp: 20 capsule, Rfl: 0   predniSONE (STERAPRED UNI-PAK 21 TAB) 10 MG (21) TBPK tablet, Take as directed on package.  (60 mg po on day 1, 50 mg po on day 2...) (Patient not taking: Reported on 09/17/2022), Disp: 21 tablet, Rfl: 0  Allergies  Allergen Reactions   Corylus    Hydrocodone     I personally reviewed active problem list, medication list, allergies, family history, social history, health maintenance with the patient/caregiver today.   ROS  Ten systems reviewed and is negative except as mentioned in HPI  Objective  Virtual encounter, vitals not obtained.  Body mass index is 30.47 kg/m.  Physical Exam  Awake, alert and oriented   PHQ2/9:    09/17/2022    1:36 PM 09/09/2022    9:39 AM 09/03/2022   11:10 AM 08/29/2022    2:10 PM 04/09/2022   10:02 AM  Depression screen PHQ 2/9  Decreased Interest 1 2 0 0 3  Down, Depressed, Hopeless 1 2 0 0 3  PHQ - 2 Score 2 4 0 0 6  Altered sleeping 2 2 0  1  Tired, decreased energy 1 2 0  1  Change in appetite 0 0 0  0  Feeling bad or failure about yourself  1 0 0  0  Trouble concentrating 1 0 0  0  Moving slowly or fidgety/restless 0 0 0  0  Suicidal thoughts 0 0 0  0  PHQ-9 Score 7 8 0  8  Difficult doing work/chores  Somewhat difficult Not difficult at all     PHQ-2/9 Result is positive.    Fall Risk:  09/17/2022    1:36 PM 09/09/2022    9:38 AM 09/03/2022   11:10 AM 08/29/2022    2:09 PM 04/09/2022   10:02 AM  Fall Risk   Falls in the past year? 0 0 0 0 0  Number falls in past yr: 0 0 0 0 0  Injury with Fall? 0 0 0 0 0  Risk for fall due to : No Fall Risks No Fall Risks No Fall Risks  No Fall Risks  Follow up Falls prevention discussed Falls prevention discussed;Education provided;Falls evaluation completed Falls prevention discussed;Education provided;Falls evaluation  completed Falls evaluation completed Falls prevention discussed     Assessment & Plan  1. Rheumatoid arthritis of right hand [facility])  She needs to contact Dr. Allena Katz, her medication is not matching his note  2. Vitamin D deficiency  - Vitamin D, Ergocalciferol, (DRISDOL) 1.25 MG (50000 UNIT) CAPS capsule; TAKE 1 CAPSULE BY MOUTH EVERY 7 DAYS  Dispense: 12 capsule; Refill: 1  3. Insulin resistance  - metFORMIN (GLUCOPHAGE-XR) 500 MG 24 hr tablet; TAKE 1 TABLET(500 MG) BY MOUTH DAILY WITH BREAKFAST  Dispense: 90 tablet; Refill: 1  4. Migraine without aura and without status migrainosus, not intractable  - topiramate (TOPAMAX) 50 MG tablet; Take 1 tablet (50 mg total) by mouth 2 (two) times daily.  Dispense: 180 tablet; Refill: 1 - Rimegepant Sulfate (NURTEC) 75 MG TBDP; Take 1 tablet by mouth daily as needed. Max of 2 in 24 hours  Dispense: 16 tablet; Refill: 2  5. Low serum vitamin B12  Continue supplementation   6. Moderate recurrent major depression (HCC)  She will contact insurance to find out who she can seen in the mean time we will resume lexapro  - escitalopram (LEXAPRO) 10 MG tablet; Take 1 tablet (10 mg total) by mouth daily.  Dispense: 90 tablet; Refill: 0   I discussed the assessment and treatment plan with the patient. The patient was provided an opportunity to ask questions and all were answered. The patient agreed with the plan and demonstrated an understanding of the instructions.  The patient was advised to call back or seek an in-person evaluation if the symptoms worsen or if the condition fails to improve as anticipated.  I provided 25 minutes of non-face-to-face time during this encounter.

## 2022-09-17 ENCOUNTER — Ambulatory Visit: Payer: Managed Care, Other (non HMO) | Admitting: Family Medicine

## 2022-09-17 ENCOUNTER — Encounter: Payer: Self-pay | Admitting: Family Medicine

## 2022-09-17 ENCOUNTER — Telehealth (INDEPENDENT_AMBULATORY_CARE_PROVIDER_SITE_OTHER): Payer: Managed Care, Other (non HMO) | Admitting: Family Medicine

## 2022-09-17 ENCOUNTER — Ambulatory Visit: Payer: Self-pay | Admitting: *Deleted

## 2022-09-17 VITALS — Ht 63.0 in | Wt 172.0 lb

## 2022-09-17 DIAGNOSIS — E559 Vitamin D deficiency, unspecified: Secondary | ICD-10-CM | POA: Diagnosis not present

## 2022-09-17 DIAGNOSIS — E538 Deficiency of other specified B group vitamins: Secondary | ICD-10-CM

## 2022-09-17 DIAGNOSIS — M069 Rheumatoid arthritis, unspecified: Secondary | ICD-10-CM | POA: Diagnosis not present

## 2022-09-17 DIAGNOSIS — G43009 Migraine without aura, not intractable, without status migrainosus: Secondary | ICD-10-CM

## 2022-09-17 DIAGNOSIS — F331 Major depressive disorder, recurrent, moderate: Secondary | ICD-10-CM

## 2022-09-17 DIAGNOSIS — E88819 Insulin resistance, unspecified: Secondary | ICD-10-CM | POA: Insufficient documentation

## 2022-09-17 MED ORDER — VITAMIN D (ERGOCALCIFEROL) 1.25 MG (50000 UNIT) PO CAPS: ORAL_CAPSULE | ORAL | 1 refills | Status: DC

## 2022-09-17 MED ORDER — TOPIRAMATE 50 MG PO TABS: 50.0000 mg | ORAL_TABLET | Freq: Two times a day (BID) | ORAL | 1 refills | Status: DC

## 2022-09-17 MED ORDER — METFORMIN HCL ER 500 MG PO TB24: ORAL_TABLET | ORAL | 1 refills | Status: DC

## 2022-09-17 MED ORDER — ESCITALOPRAM OXALATE 10 MG PO TABS: 10.0000 mg | ORAL_TABLET | Freq: Every day | ORAL | 0 refills | Status: DC

## 2022-09-17 MED ORDER — NURTEC 75 MG PO TBDP: 1.0000 | ORAL_TABLET | Freq: Every day | ORAL | 2 refills | Status: DC | PRN

## 2022-09-17 NOTE — Telephone Encounter (Signed)
Pt called in and agent took the call.   Pt was instructed to call back to talk with a nurse.   She hung up while agent was telling me pt was instructed to call and talk with a nurse before her appt. Today at 2:40 with Dr. Carlynn Purl as a virtual visit.  I called [facility] and spoke with Melissa.   The nurse from the office was trying to call the pt back to get the needed information before Dr. Carlynn Purl did her video visit.   It wasn't for the [facility] triage nurse.   I thanked Melissa for her help.  No further action needed.

## 2022-09-25 ENCOUNTER — Other Ambulatory Visit: Payer: Self-pay | Admitting: Family Medicine

## 2022-09-25 DIAGNOSIS — R062 Wheezing: Secondary | ICD-10-CM

## 2022-09-25 DIAGNOSIS — J9801 Acute bronchospasm: Secondary | ICD-10-CM

## 2022-09-30 NOTE — Telephone Encounter (Unsigned)
Copied from CRM [id number]. Topic: General - Other >> Sep 30, 2022  4:16 PM Pincus Sanes wrote: [facility] calling wanting to make office aware of fax sent over re pt having surgury. Must have fu asap as scheduled.

## 2022-10-01 NOTE — Progress Notes (Unsigned)
Name: Virginia Walls   MRN: [id number]    DOB: September 27, 1981   Date:10/02/2022       Progress Note  Subjective  Chief Complaint  Surgical Clearance  HPI  Fracture of 5 th metatarsal: she fell at home ,  missed last step and landed on right lateral foot , she developed redness right away, pain while bearing weight, she had a visit already scheduled with Dr. Allena Katz that afternoon and x-ray showed fracture and she will have ORIF on Dec 21 st, 2023 by Dr. Excell Seltzer  Pain level is 7/10 .   She has RA, takes plaquenil  She takes metformin for insulin resistance and advised to hold metformin for 48 hours prior to surgery and 24 hours afterwards  Patient Active Problem List   Diagnosis Date Noted   Rheumatoid arthritis of right hand (HCC) 09/17/2022   Low serum vitamin B12 09/17/2022   Insulin resistance 09/17/2022   Intertrigo 04/09/2022   Bacterial vaginosis 04/09/2022   Effusion of left knee 04/09/2022   Migraine without aura and without status migrainosus, not intractable 04/09/2022   Inflammatory arthritis 02/25/2022   Noncompliance with medication regimen 01/16/2021   Moderate recurrent major depression (HCC) 04/11/2020   GAD (generalized anxiety disorder) 04/11/2020   Bereavement 04/11/2020   At risk for prolonged QT interval syndrome 04/11/2020   High risk medication use 04/11/2020   Chronic constipation 08/12/2017   Hyperglycemia 05/04/2015   Tattoos 05/04/2015   Headache, migraine 04/29/2015   Restless leg 04/29/2015   Snores 04/29/2015   Vitamin D deficiency 04/29/2015    Past Surgical History:  Procedure Laterality Date   ABDOMINAL HYSTERECTOMY     BREAST BIOPSY Left 03/21/2022   US biopsy/ ribbon clip/ path pending   TUBAL LIGATION      Family History  Problem Relation Age of Onset   Heart disease Mother    Asthma Mother    Hypothyroidism Mother    Bipolar disorder Mother    Hypothyroidism Sister    Diabetes Sister    Hyperlipidemia Sister    Bipolar disorder  Maternal Aunt    Bipolar disorder Maternal Uncle    Breast cancer Maternal Grandmother    Heart disease Paternal Grandmother        Pacemaker   Leukemia Paternal Grandmother    Diabetes Paternal Grandmother    Asthma Son     Social History   Tobacco Use   Smoking status: Some Days    Packs/day: 0.25    Years: 4.00    Total pack years: 1.00    Types: Cigarettes    Start date: 05/04/2011    Last attempt to quit: 07/21/2020    Years since quitting: 2.2   Smokeless tobacco: Never   Tobacco comments:    She recently joined a quit program at work, smoking at most 3 cigarettes per day now   Substance Use Topics   Alcohol use: Yes    Alcohol/week: 0.0 standard drinks of alcohol    Comment: occasionally     Current Outpatient Medications:    albuterol (VENTOLIN HFA) 108 (90 Base) MCG/ACT inhaler, INHALE 2 PUFFS INTO THE LUNGS EVERY 6 HOURS AS NEEDED FOR WHEEZING OR SHORTNESS OF BREATH., Disp: 8.5 g, Rfl: 0   Cyanocobalamin (B-12) 1000 MCG SUBL, Place 1 tablet under the tongue 3 (three) times a week., Disp: 100 tablet, Rfl: 1   escitalopram (LEXAPRO) 10 MG tablet, Take 1 tablet (10 mg total) by mouth daily., Disp: 90 tablet, Rfl: 0  hydroxychloroquine (PLAQUENIL) 200 MG tablet, Take 200 mg by mouth 2 (two) times daily., Disp: , Rfl:    metFORMIN (GLUCOPHAGE-XR) 500 MG 24 hr tablet, TAKE 1 TABLET(500 MG) BY MOUTH DAILY WITH BREAKFAST, Disp: 90 tablet, Rfl: 1   Rimegepant Sulfate (NURTEC) 75 MG TBDP, Take 1 tablet by mouth daily as needed. Max of 2 in 24 hours, Disp: 16 tablet, Rfl: 2   topiramate (TOPAMAX) 50 MG tablet, Take 1 tablet (50 mg total) by mouth 2 (two) times daily., Disp: 180 tablet, Rfl: 1   Vitamin D, Ergocalciferol, (DRISDOL) 1.25 MG (50000 UNIT) CAPS capsule, TAKE 1 CAPSULE BY MOUTH EVERY 7 DAYS, Disp: 12 capsule, Rfl: 1  Allergies  Allergen Reactions   Corylus    Hydrocodone     I personally reviewed active problem list, medication list, allergies, family history,  social history, health maintenance with the patient/caregiver today.   ROS  Ten systems reviewed and is negative except as mentioned in HPI   Objective  Vitals:   10/02/22 1431  BP: 122/78  Pulse: 98  Resp: 16  Temp: 98.5 F (36.9 C)  TempSrc: Oral  SpO2: 100%  Weight: 179 lb 4.8 oz (81.3 kg)  Height: 5\' 4"  (1.626 m)    Body mass index is 30.78 kg/m.  Physical Exam  Constitutional: Patient appears well-developed and well-nourished. Obese    HEENT: head atraumatic, normocephalic, pupils equal and reactive to light, neck supple, throat within normal limits Cardiovascular: Normal rate, regular rhythm and normal heart sounds.  No murmur heard. No BLE edema. Pulmonary/Chest: Effort normal and breath sounds normal. No respiratory distress. Abdominal: Soft.  There is no tenderness. Muscular skeletal: wearing an ortho boot Psychiatric: Patient has a normal mood and affect. behavior is normal. Judgment and thought content normal.    PHQ2/9:    10/02/2022    2:43 PM 09/17/2022    1:36 PM 09/09/2022    9:39 AM 09/03/2022   11:10 AM 08/29/2022    2:10 PM  Depression screen PHQ 2/9  Decreased Interest 1 1 2  0 0  Down, Depressed, Hopeless 1 1 2  0 0  PHQ - 2 Score 2 2 4  0 0  Altered sleeping 1 2 2  0   Tired, decreased energy 1 1 2  0   Change in appetite 1 0 0 0   Feeling bad or failure about yourself  1 1 0 0   Trouble concentrating 1 1 0 0   Moving slowly or fidgety/restless 0 0 0 0   Suicidal thoughts 0 0 0 0   PHQ-9 Score 7 7 8  0   Difficult doing work/chores Not difficult at all  Somewhat difficult Not difficult at all     phq 9 is positive   Fall Risk:    10/02/2022    2:38 PM 09/17/2022    1:36 PM 09/09/2022    9:38 AM 09/03/2022   11:10 AM 08/29/2022    2:09 PM  Fall Risk   Falls in the past year? 0 0 0 0 0  Number falls in past yr:  0 0 0 0  Injury with Fall?  0 0 0 0  Risk for fall due to : Impaired mobility;Impaired balance/gait;Orthopedic patient No  Fall Risks No Fall Risks No Fall Risks   Follow up Falls prevention discussed Falls prevention discussed Falls prevention discussed;Education provided;Falls evaluation completed Falls prevention discussed;Education provided;Falls evaluation completed Falls evaluation completed      Functional Status Survey: Is the patient deaf or  have difficulty hearing?: No Does the patient have difficulty seeing, even when wearing glasses/contacts?: No Does the patient have difficulty concentrating, remembering, or making decisions?: No Does the patient have difficulty walking or climbing stairs?: Yes Does the patient have difficulty dressing or bathing?: No Does the patient have difficulty doing errands alone such as visiting a doctor's office or shopping?: No    Assessment & Plan  1. Closed fracture of base of fifth metatarsal bone of right foot  May proceed to surgery   2. Pre-op evaluation   3. Needs flu shot  - Flu Vaccine QUAD 6+ mos PF IM (Fluarix Quad PF)  4. Need for vaccination with 20-polyvalent pneumococcal conjugate vaccine  - Pneumococcal conjugate vaccine 20-valent (Prevnar 20)

## 2022-10-01 NOTE — Telephone Encounter (Signed)
Scheduled for tomorrow.

## 2022-10-02 ENCOUNTER — Ambulatory Visit: Payer: Managed Care, Other (non HMO) | Admitting: Family Medicine

## 2022-10-02 ENCOUNTER — Encounter: Payer: Self-pay | Admitting: Family Medicine

## 2022-10-02 VITALS — BP 122/78 | HR 98 | Temp 98.5°F | Resp 16 | Ht 64.0 in | Wt 179.3 lb

## 2022-10-02 DIAGNOSIS — Z01818 Encounter for other preprocedural examination: Secondary | ICD-10-CM

## 2022-10-02 DIAGNOSIS — Z23 Encounter for immunization: Secondary | ICD-10-CM | POA: Diagnosis not present

## 2022-10-02 DIAGNOSIS — S92351A Displaced fracture of fifth metatarsal bone, right foot, initial encounter for closed fracture: Secondary | ICD-10-CM | POA: Insufficient documentation

## 2022-10-07 ENCOUNTER — Encounter: Payer: Self-pay | Admitting: Podiatry

## 2022-10-07 ENCOUNTER — Other Ambulatory Visit: Payer: Self-pay | Admitting: Podiatry

## 2022-10-09 NOTE — Discharge Instructions (Signed)
[facility]  POST OPERATIVE INSTRUCTIONS FOR DR. FOWLER AND [facility] PODIATRY [facility]PART[facility]   Take your medication as prescribed.  Pain medication should be taken only as needed.  Keep the dressing clean, dry and intact.  Keep your foot elevated above the heart level for the first 48 hours.  Walking to the bathroom and brief periods of walking are acceptable, unless we have instructed you to be non-weight bearing.  Always wear your post-op shoe when walking.  Always use your crutches if you are to be non-weight bearing.  Do not take a shower. Baths are permissible as long as the foot is kept out of the water.   Every hour you are awake:  Bend your knee 15 times. Flex foot 15 times Massage calf 15 times  Call [facility] ([phone]) if any of the following problems occur: You develop a temperature or fever. The bandage becomes saturated with blood. Medication does not stop your pain. Injury of the foot occurs. Any symptoms of infection including redness, odor, or red streaks running from wound. 

## 2022-10-10 ENCOUNTER — Encounter
Admission: RE | Disposition: A | Discharge status: Home / Self Care | Payer: Self-pay | Source: Home / Self Care | Attending: Podiatry

## 2022-10-10 ENCOUNTER — Other Ambulatory Visit: Payer: Self-pay

## 2022-10-10 ENCOUNTER — Encounter: Payer: Self-pay | Admitting: Podiatry

## 2022-10-10 ENCOUNTER — Ambulatory Visit: Payer: Managed Care, Other (non HMO) | Admitting: Anesthesiology

## 2022-10-10 ENCOUNTER — Ambulatory Visit
Admission: RE | Admit: 2022-10-10 | Discharge: 2022-10-10 | Disposition: A | Discharge status: Home / Self Care | Payer: Managed Care, Other (non HMO) | Attending: Podiatry | Admitting: Podiatry

## 2022-10-10 ENCOUNTER — Ambulatory Visit: Payer: Self-pay

## 2022-10-10 DIAGNOSIS — Z90710 Acquired absence of both cervix and uterus: Secondary | ICD-10-CM | POA: Diagnosis not present

## 2022-10-10 DIAGNOSIS — Y9302 Activity, running: Secondary | ICD-10-CM | POA: Diagnosis not present

## 2022-10-10 DIAGNOSIS — F17210 Nicotine dependence, cigarettes, uncomplicated: Secondary | ICD-10-CM | POA: Diagnosis not present

## 2022-10-10 DIAGNOSIS — W19XXXA Unspecified fall, initial encounter: Secondary | ICD-10-CM | POA: Insufficient documentation

## 2022-10-10 DIAGNOSIS — S92351A Displaced fracture of fifth metatarsal bone, right foot, initial encounter for closed fracture: Secondary | ICD-10-CM | POA: Insufficient documentation

## 2022-10-10 HISTORY — PX: ORIF TOE FRACTURE: SHX5032

## 2022-10-10 HISTORY — DX: Motion sickness, initial encounter: T75.3XXA

## 2022-10-10 HISTORY — DX: Rheumatoid arthritis, unspecified: M06.9

## 2022-10-10 LAB — GLUCOSE, CAPILLARY: Glucose-Capillary: 109 mg/dL — ABNORMAL HIGH (ref 70–99)

## 2022-10-10 SURGERY — OPEN REDUCTION INTERNAL FIXATION (ORIF) METATARSAL (TOE) FRACTURE
Anesthesia: General | Site: Toe | Laterality: Right

## 2022-10-10 MED ORDER — OXYCODONE-ACETAMINOPHEN 5-325 MG PO TABS: 1.0000 | ORAL_TABLET | Freq: Four times a day (QID) | ORAL | 0 refills | Status: AC | PRN

## 2022-10-10 MED ORDER — BUPIVACAINE HCL (PF) 0.25 % IJ SOLN
INTRAMUSCULAR | Status: DC | PRN
  Administered 2022-10-10: 20 mL

## 2022-10-10 MED ORDER — ONDANSETRON HCL 4 MG/2ML IJ SOLN
INTRAMUSCULAR | Status: DC | PRN
  Administered 2022-10-10: 4 mg via INTRAVENOUS

## 2022-10-10 MED ORDER — PROPOFOL 10 MG/ML IV BOLUS
INTRAVENOUS | Status: DC | PRN
  Administered 2022-10-10: 200 mg via INTRAVENOUS

## 2022-10-10 MED ORDER — DEXMEDETOMIDINE HCL IN NACL 80 MCG/20ML IV SOLN
INTRAVENOUS | Status: DC | PRN
  Administered 2022-10-10 (×2): 4 ug via BUCCAL

## 2022-10-10 MED ORDER — ONDANSETRON HCL 4 MG PO TABS: 4.0000 mg | ORAL_TABLET | Freq: Three times a day (TID) | ORAL | 0 refills | Status: DC | PRN

## 2022-10-10 MED ORDER — KETOROLAC TROMETHAMINE 15 MG/ML IJ SOLN
INTRAMUSCULAR | Status: DC | PRN
  Administered 2022-10-10: 30 mg via INTRAVENOUS

## 2022-10-10 MED ORDER — FENTANYL CITRATE (PF) 100 MCG/2ML IJ SOLN
INTRAMUSCULAR | Status: DC | PRN
  Administered 2022-10-10: 50 ug via INTRAVENOUS

## 2022-10-10 MED ORDER — DEXAMETHASONE SODIUM PHOSPHATE 4 MG/ML IJ SOLN
INTRAMUSCULAR | Status: DC | PRN
  Administered 2022-10-10: 8 mg via INTRAVENOUS

## 2022-10-10 MED ORDER — LACTATED RINGERS IV SOLN: INTRAVENOUS | Status: DC

## 2022-10-10 MED ORDER — ACETAMINOPHEN 10 MG/ML IV SOLN
INTRAVENOUS | Status: DC | PRN
  Administered 2022-10-10: 1000 mg via INTRAVENOUS

## 2022-10-10 MED ORDER — ASPIRIN 81 MG PO TBEC: 81.0000 mg | DELAYED_RELEASE_TABLET | Freq: Two times a day (BID) | ORAL | 0 refills | Status: AC

## 2022-10-10 MED ORDER — CEFAZOLIN SODIUM-DEXTROSE 2-4 GM/100ML-% IV SOLN
2.0000 g | INTRAVENOUS | Status: AC
  Administered 2022-10-10: 2 g via INTRAVENOUS

## 2022-10-10 MED ORDER — 0.9 % SODIUM CHLORIDE (POUR BTL) OPTIME
TOPICAL | Status: DC | PRN
  Administered 2022-10-10: 1000 mL

## 2022-10-10 MED ORDER — MIDAZOLAM HCL 5 MG/5ML IJ SOLN
INTRAMUSCULAR | Status: DC | PRN
  Administered 2022-10-10: 2 mg via INTRAVENOUS

## 2022-10-10 MED ORDER — LIDOCAINE HCL (CARDIAC) PF 100 MG/5ML IV SOSY
PREFILLED_SYRINGE | INTRAVENOUS | Status: DC | PRN
  Administered 2022-10-10: 100 mg via INTRATRACHEAL

## 2022-10-10 MED ORDER — AMOXICILLIN-POT CLAVULANATE 875-125 MG PO TABS: 1.0000 | ORAL_TABLET | Freq: Two times a day (BID) | ORAL | 0 refills | Status: DC

## 2022-10-10 SURGICAL SUPPLY — 49 items
BIT DRILL 1.3 (BIT) ×1
BIT DRILL 100X1.3XAO CNCT (BIT) IMPLANT
BIT DRL 100X1.3XAO CNCT (BIT) ×1
BNDG CMPR STD VLCR NS LF 5.8X4 (GAUZE/BANDAGES/DRESSINGS) ×1
BNDG CMPR STD VLCR NS LF 5.8X6 (GAUZE/BANDAGES/DRESSINGS) ×1
BNDG ELASTIC 4X5.8 VLCR NS LF (GAUZE/BANDAGES/DRESSINGS) ×1 IMPLANT
BNDG ELASTIC 6X5.8 VLCR NS LF (GAUZE/BANDAGES/DRESSINGS) ×1 IMPLANT
BNDG ESMARK 4X12 TAN STRL LF (GAUZE/BANDAGES/DRESSINGS) ×1 IMPLANT
BNDG GAUZE DERMACEA FLUFF 4 (GAUZE/BANDAGES/DRESSINGS) ×1 IMPLANT
BNDG GZE DERMACEA 4 6PLY (GAUZE/BANDAGES/DRESSINGS) ×1
CANISTER SUCT 1200ML W/VALVE (MISCELLANEOUS) ×1 IMPLANT
COVER LIGHT HANDLE UNIVERSAL (MISCELLANEOUS) ×2 IMPLANT
CUFF TOURN SGL QUICK 30 (TOURNIQUET CUFF) ×1
CUFF TRNQT CYL 30X4X21-28X (TOURNIQUET CUFF) ×1 IMPLANT
DURAPREP 26ML APPLICATOR (WOUND CARE) ×2 IMPLANT
ELECT REM PT RETURN 9FT ADLT (ELECTROSURGICAL) ×1
ELECTRODE REM PT RTRN 9FT ADLT (ELECTROSURGICAL) ×1 IMPLANT
GAUZE 4X4 16PLY ~~LOC~~+RFID DBL (SPONGE) ×1 IMPLANT
GAUZE SPONGE 4X4 12PLY STRL (GAUZE/BANDAGES/DRESSINGS) ×1 IMPLANT
GAUZE XEROFORM 1X8 LF (GAUZE/BANDAGES/DRESSINGS) ×1 IMPLANT
GLOVE BIOGEL PI IND STRL 7.5 (GLOVE) ×1 IMPLANT
GLOVE SURG SS PI 7.0 STRL IVOR (GLOVE) ×1 IMPLANT
GOWN STRL REUS W/ TWL LRG LVL3 (GOWN DISPOSABLE) ×2 IMPLANT
GOWN STRL REUS W/TWL LRG LVL3 (GOWN DISPOSABLE) ×2
KIT TURNOVER KIT A (KITS) ×1 IMPLANT
NS IRRIG 500ML POUR BTL (IV SOLUTION) ×1 IMPLANT
PADDING CAST BLEND 4X4 NS (MISCELLANEOUS) ×3 IMPLANT
PLATE T 8H (Plate) IMPLANT
SCREW 2.0 X10 LOCKING (Screw) IMPLANT
SCREW LOCK  2.0X11 (Screw) ×1 IMPLANT
SCREW LOCK 2.0X11 (Screw) IMPLANT
SCREW LOCKING 2.0X12 (Screw) IMPLANT
SCREW LOCKING 2.0X14 (Screw) IMPLANT
SCREW LOCKING 2.5X12 (Screw) IMPLANT
SCREW LOCKING 2.5X14 (Screw) IMPLANT
SPLINT CAST 1 STEP 4X30 (MISCELLANEOUS) ×1 IMPLANT
SPONGE T-LAP 18X18 ~~LOC~~+RFID (SPONGE) ×1 IMPLANT
STOCKINETTE ORTHO 6X25 (MISCELLANEOUS) ×1 IMPLANT
SUT ETHILON 4-0 (SUTURE) ×1
SUT ETHILON 4-0 FS2 18XMFL BLK (SUTURE) ×1
SUT MNCRL 4-0 (SUTURE) ×1
SUT MNCRL 4-0 27XMFL (SUTURE) ×1
SUT VIC AB 3-0 SH 27 (SUTURE) ×1
SUT VIC AB 3-0 SH 27X BRD (SUTURE) IMPLANT
SUT VIC AB 4-0 FS2 27 (SUTURE) ×1 IMPLANT
SUTURE ETHLN 4-0 FS2 18XMF BLK (SUTURE) ×1 IMPLANT
SUTURE MNCRL 4-0 27XMF (SUTURE) ×1 IMPLANT
T-PLATE 8H (Plate) ×1 IMPLANT
WIRE OLIVE SMOOTH 1.3 (WIRE) IMPLANT
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
## 2022-10-10 NOTE — Anesthesia Postprocedure Evaluation (Signed)
Anesthesia Post Note  Patient: Virginia Walls  Procedure(s) Performed: OPEN REDUCTION INTERNAL FIXATION (ORIF) METATARSAL (TOE) FRACTURE - FIFTH (Right: Toe)  Patient location during evaluation: PACU Anesthesia Type: General Level of consciousness: awake and alert Pain management: pain level controlled Vital Signs Assessment: post-procedure vital signs reviewed and stable Respiratory status: spontaneous breathing, nonlabored ventilation, respiratory function stable and patient connected to nasal cannula oxygen Cardiovascular status: blood pressure returned to baseline and stable Postop Assessment: no apparent nausea or vomiting Anesthetic complications: no   No notable events documented.   Last Vitals:  Vitals:   10/10/22 1129 10/10/22 1130  BP: 131/85   Pulse:    Resp:    Temp:  36.6 C  SpO2:      Last Pain:  Vitals:   10/10/22 1129  TempSrc:   PainSc: 0-No pain                 Cleda Mccreedy Zaylah Blecha

## 2022-10-10 NOTE — H&P (Signed)
HISTORY AND PHYSICAL INTERVAL NOTE:  10/10/2022  9:08 AM  Virginia Walls  has presented today for surgery, with the diagnosis of S.92.351A - Closed displaced fracture of fifth metatarsal bone of right foot M79.671 - Right foot pain.  The various methods of treatment have been discussed with the patient.  No guarantees were given.  After consideration of risks, benefits and other options for treatment, the patient has consented to surgery.  I have reviewed the patients' chart and labs.    PROCEDURE: RIGHT 5TH METATARSAL FRACTURE ORIF  A history and physical examination was performed in my office.  The patient was reexamined.  There have been no changes to this history and physical examination.  Virginia Walls, DPM

## 2022-10-10 NOTE — Transfer of Care (Signed)
Immediate Anesthesia Transfer of Care Note  Patient: Virginia Walls  Procedure(s) Performed: OPEN REDUCTION INTERNAL FIXATION (ORIF) METATARSAL (TOE) FRACTURE - FIFTH (Right: Toe)  Patient Location: PACU  Anesthesia Type: General LMA  Level of Consciousness: awake, alert  and patient cooperative  Airway and Oxygen Therapy: Patient Spontanous Breathing and Patient connected to supplemental oxygen  Post-op Assessment: Post-op Vital signs reviewed, Patient's Cardiovascular Status Stable, Respiratory Function Stable, Patent Airway and No signs of Nausea or vomiting  Post-op Vital Signs: Reviewed and stable  Complications: No notable events documented.

## 2022-10-10 NOTE — Op Note (Signed)
PODIATRY / FOOT AND ANKLE SURGERY OPERATIVE REPORT    SURGEON: Caroline More, DPM  PRE-OPERATIVE DIAGNOSIS:  1.  Right displaced closed comminuted distal fifth metatarsal shaft fracture  POST-OPERATIVE DIAGNOSIS: Same  PROCEDURE(S): Right open reduction internal fixation fifth metatarsal fracture  HEMOSTASIS: Right ankle tourniquet  ANESTHESIA: MAC  ESTIMATED BLOOD LOSS: 10 cc  FINDING(S): 1.  Comminuted right distal fifth metatarsal distal shaft fracture, butterfly fragment present, extra-articular, closed  PATHOLOGY/SPECIMEN(S): None  INDICATIONS:   ACELYNN DEJONGE is a 41 y.o. female who presents with a displaced fracture of the right fifth metatarsal.  Patient was seen by rheumatology and was noted to have an injury with acute fracture to the fifth metatarsal.  Patient was seen in clinic and evaluated for this.  All treatment options were discussed with the patient both conservative and surgical attempts at correction clinic digits and complications at this time patient is elected for surgical invention consisting of right fifth metatarsal fracture open reduction with internal fixation, no guarantees given.  Consent obtained prior to procedure..  DESCRIPTION: After obtaining full informed written consent, the patient was brought back to the operating room and placed supine upon the operating table.  The patient received IV antibiotics prior to induction.  After obtaining adequate anesthesia, 20 cc of half percent Marcaine plain was injected about the right fifth ray.  The patient was prepped and draped in the standard fashion.  An Esmarch bandage was used to exsanguinate the right lower extremity and the pneumatic ankle tourniquet was inflated.  Attention was then directed to the right fifth metatarsal distally where an incision was made over the midshaft extending to the fifth metatarsal phalangeal joint area.  The incision was deepened through the subcutaneous tissues utilizing  sharp and blunt dissection and care was taken to identify and retract all vital neurovascular structures all venous contributories were cauterized as necessary.  At this time a capsular and periosteal incision was made lateral to the tendon of the extensor digitorum longus along the fifth metatarsal phalangeal joint extending across the midshaft along the entirety of the incision.  The periosteal and capsular tissues were was dissected medially and laterally thereby exposing the fifth metatarsal phalangeal joint and fifth metatarsal at the operative site.  At this time the fracture was able to be visualized and was noted to have 3 distinct fractures.  The fracture appeared to be shifted laterally and proximally at the fifth metatarsal midshaft, there also appeared to be a butterfly fragment present.  The fracture hematoma was debrided with a curette and rongeur.  The fracture was then mobilized and held in anatomic position with a reduction clamp reducing the metatarsal to its normal alignment.  The butterfly fragment was also impacted into the area for correction to hold in place.  It appeared to be fairly stable overall.  At this time a Paragon 28 T plate 7 hole was placed across the area and held in place with olive wires.  The reduction was seen under fluoroscopic guidance and placement of plate appeared to be excellent overall.  At this time 3 screws that were locking were placed into the capital fragment distal to the fracture site and 3 screws were then placed proximal to the fracture site as well while holding the reduction clamp in place.  All screws were locking screws, 5 were 2 oh screws and one was a 2.5 screw.  Fracture reduction and placement of hardware appeared to be excellent under fluoroscopic guidance and the fifth metatarsal  appeared to be in near anatomic position overall.  The surgical site was flushed with copious amounts normal sterile saline.  The periosteal and capsular structures were then  reapproximated well coapted with 3-0 Vicryl along with subcutaneous tissue.  The skin was then reapproximated well coapted with 3-0 nylon in horizontal mattress type stitching.  The pneumatic ankle tourniquet was deflated and a prompt hyperemic response was noted to all digits of the right foot.  A postoperative dressing was applied consisting of Xeroform followed by 4 x 4 gauze, gauze roll, Webril, posterior splint, Ace wrap with compression.  The patient tolerated the procedure and anesthesia well was transferred to recovery room vital signs stable vascular status intact all toes the right foot.  Following a period of postoperative monitoring the patient be discharged home with the appropriate orders, instructions, and medications.  Patient is to remain nonweightbearing to the right lower extremity at all times.  Patient to follow-up in clinic in 1 week for further evaluation and dressing change.  COMPLICATIONS: None  CONDITION: Good, stable  Caroline More, DPM

## 2022-10-11 ENCOUNTER — Encounter: Payer: Self-pay | Admitting: Podiatry

## 2022-10-16 ENCOUNTER — Encounter: Payer: Self-pay | Admitting: Podiatry

## 2022-10-23 ENCOUNTER — Other Ambulatory Visit: Payer: Self-pay | Admitting: Family Medicine

## 2022-10-23 DIAGNOSIS — R062 Wheezing: Secondary | ICD-10-CM

## 2022-10-23 DIAGNOSIS — J9801 Acute bronchospasm: Secondary | ICD-10-CM

## 2022-10-31 NOTE — Progress Notes (Deleted)
Name: Virginia Walls   MRN: [id number]    DOB: 07-13-1981   Date:10/31/2022       Progress Note  Subjective  Chief Complaint  Annual Exam  HPI  Patient presents for annual CPE.  Diet: *** Exercise: ***  Last Eye Exam: *** Last Dental Exam: ***  Flowsheet Row Video Visit from 08/29/2022 in [facility]  AUDIT-C Score 0      Depression: Phq 9 is  {Desc; negative/positive:13464}    10/02/2022    2:43 PM 09/17/2022    1:36 PM 09/09/2022    9:39 AM 09/03/2022   11:10 AM 08/29/2022    2:10 PM  Depression screen PHQ 2/9  Decreased Interest 1 1 2 $ 0 0  Down, Depressed, Hopeless 1 1 2 $ 0 0  PHQ - 2 Score 2 2 4 $ 0 0  Altered sleeping 1 2 2 $ 0   Tired, decreased energy 1 1 2 $ 0   Change in appetite 1 0 0 0   Feeling bad or failure about yourself  1 1 0 0   Trouble concentrating 1 1 0 0   Moving slowly or fidgety/restless 0 0 0 0   Suicidal thoughts 0 0 0 0   PHQ-9 Score 7 7 8 $ 0   Difficult doing work/chores Not difficult at all  Somewhat difficult Not difficult at all    Hypertension: BP Readings from Last 3 Encounters:  10/10/22 131/85  10/02/22 122/78  09/09/22 108/76   Obesity: Wt Readings from Last 3 Encounters:  10/10/22 175 lb 8 oz (79.6 kg)  10/02/22 179 lb 4.8 oz (81.3 kg)  09/17/22 172 lb (78 kg)   BMI Readings from Last 3 Encounters:  10/10/22 30.11 kg/m  10/02/22 30.78 kg/m  09/17/22 30.47 kg/m     Vaccines:   HPV: N/A Tdap: up to date Shingrix: N/A Pneumonia: N/A Flu: up to date COVID-19: up to date   Hep C Screening: 06/13/20 STD testing and prevention (HIV/chl/gon/syphilis): 06/16/12 Intimate partner violence: negative screen  Sexual History : Menstrual History/LMP/Abnormal Bleeding:  Discussed importance of follow up if any post-menopausal bleeding: yes  Incontinence Symptoms: negative for symptoms   Breast cancer:  - Last Mammogram: N/A - BRCA gene screening: N/A   Osteoporosis Prevention : Discussed high calcium and  vitamin D supplementation, weight bearing exercises Bone density: N/A  Cervical cancer screening: N/A  Skin cancer: Discussed monitoring for atypical lesions  Colorectal cancer: N/A   Lung cancer:  Low Dose CT Chest recommended if Age 65-80 years, 20 pack-year currently smoking OR have quit w/in 15years. Patient does not qualify for screen   ECG: 06/13/20  Advanced Care Planning: A voluntary discussion about advance care planning including the explanation and discussion of advance directives.  Discussed health care proxy and Living will, and the patient was able to identify a health care proxy as ***.  Patient does not have a living will and power of attorney of health care   Lipids: Lab Results  Component Value Date   CHOL 231 (H) 10/31/2021   CHOL 208 (H) 04/14/2020   CHOL 192 07/24/2017   Lab Results  Component Value Date   HDL 66 10/31/2021   HDL 61 04/14/2020   HDL 50 (L) 07/24/2017   Lab Results  Component Value Date   LDLCALC 150 (H) 10/31/2021   LDLCALC 131 (H) 04/14/2020   LDLCALC 125 (H) 07/24/2017   Lab Results  Component Value Date   TRIG 87 10/31/2021   TRIG  90 04/14/2020   TRIG 78 07/24/2017   Lab Results  Component Value Date   CHOLHDL 3.5 10/31/2021   CHOLHDL 3.4 04/14/2020   CHOLHDL 3.8 07/24/2017   No results found for: "LDLDIRECT"  Glucose: Glucose  Date Value Ref Range Status  10/31/2021 102 (H) 70 - 99 mg/dL Final  08/20/2013 86 65 - 99 mg/dL Final   Glucose, Bld  Date Value Ref Range Status  07/24/2017 85 65 - 99 mg/dL Final    Comment:    .            Fasting reference interval .   04/20/2015 96 65 - 99 mg/dL Final  02/24/2015 100 (H) 65 - 99 mg/dL Final   Glucose-Capillary  Date Value Ref Range Status  10/10/2022 109 (H) 70 - 99 mg/dL Final    Comment:    Glucose reference range applies only to samples taken after fasting for at least 8 hours.    Patient Active Problem List   Diagnosis Date Noted   Closed fracture of base  of fifth metatarsal bone of right foot 10/02/2022   Rheumatoid arthritis of right hand ([place]) 09/17/2022   Low serum vitamin B12 09/17/2022   Insulin resistance 09/17/2022   Intertrigo 04/09/2022   Bacterial vaginosis 04/09/2022   Effusion of left knee 04/09/2022   Migraine without aura and without status migrainosus, not intractable 04/09/2022   Inflammatory arthritis 02/25/2022   Noncompliance with medication regimen 01/16/2021   Moderate recurrent major depression ([place]) 04/11/2020   GAD (generalized anxiety disorder) 04/11/2020   Bereavement 04/11/2020   At risk for prolonged QT interval syndrome 04/11/2020   High risk medication use 04/11/2020   Chronic constipation 08/12/2017   Hyperglycemia 05/04/2015   Tattoos 05/04/2015   Headache, migraine 04/29/2015   Restless leg 04/29/2015   Snores 04/29/2015   Vitamin D deficiency 04/29/2015    Past Surgical History:  Procedure Laterality Date   ABDOMINAL HYSTERECTOMY     BREAST BIOPSY Left 03/21/2022   US biopsy/ ribbon clip/ path pending   ORIF TOE FRACTURE Right 10/10/2022   Procedure: OPEN REDUCTION INTERNAL FIXATION (ORIF) METATARSAL (TOE) FRACTURE - FIFTH;  Surgeon: Caroline More, DPM;  Location: [facility];  Service: Podiatry;  Laterality: Right;   TUBAL LIGATION      Family History  Problem Relation Age of Onset   Heart disease Mother    Asthma Mother    Hypothyroidism Mother    Bipolar disorder Mother    Hypothyroidism Sister    Diabetes Sister    Hyperlipidemia Sister    Bipolar disorder Maternal Aunt    Bipolar disorder Maternal Uncle    Breast cancer Maternal Grandmother    Heart disease Paternal Grandmother        Pacemaker   Leukemia Paternal Grandmother    Diabetes Paternal Grandmother    Asthma Son     Social History   Socioeconomic History   Marital status: Single    Spouse name: Not on file   Number of children: 3   Years of education: Not on file   Highest education level: Not on file   Occupational History   Occupation: customer service specialist   Tobacco Use   Smoking status: Former    Packs/day: 0.25    Years: 4.00    Total pack years: 1.00    Types: Cigarettes    Start date: 05/04/2011    Quit date: 09/30/2022    Years since quitting: 0.0   Smokeless tobacco: Never  Vaping Use   Vaping Use: Never used  Substance and Sexual Activity   Alcohol use: Not Currently    Comment: occasionally   Drug use: No   Sexual activity: Yes    Partners: Male  Other Topics Concern   Not on file  Social History Narrative   Lives with same person / boyfriend for the past 18 years   She has three son. 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    Youngest sone died at age 27 from Leukemia    Social Determinants of Health   Financial Resource Strain: Low Risk  (10/31/2021)   Overall Financial Resource Strain (CARDIA)    Difficulty of Paying Living Expenses: Not hard at all  Food Insecurity: No Food Insecurity (10/31/2021)   Hunger Vital Sign    Worried About Running Out of Food in the Last Year: Never true    Ran Out of Food in the Last Year: Never true  Transportation Needs: No Transportation Needs (10/31/2021)   PRAPARE - Hydrologist (Medical): No    Lack of Transportation (Non-Medical): No  Physical Activity: Insufficiently Active (10/31/2021)   Exercise Vital Sign    Days of Exercise per Week: 2 days    Minutes of Exercise per Session: 20 min  Stress: Stress Concern Present (10/31/2021)   [place]    Feeling of Stress : Rather much  Social Connections: Moderately Isolated (10/31/2021)   Social Connection and Isolation Panel [NHANES]    Frequency of Communication with Friends and Family: Twice a week    Frequency of Social Gatherings with Friends and Family: Once a week    Attends Religious Services: Never    Building surveyor or Organizations: No    Attends Archivist Meetings: Never    Marital Status: Living with partner  Intimate Partner Violence: Not At Risk (10/31/2021)   Humiliation, Afraid, Rape, and Kick questionnaire    Fear of Current or Ex-Partner: No    Emotionally Abused: No    Physically Abused: No    Sexually Abused: No     Current Outpatient Medications:    albuterol (VENTOLIN HFA) 108 (90 Base) MCG/ACT inhaler, INHALE 2 PUFFS INTO THE LUNGS EVERY 6 HOURS AS NEEDED FOR WHEEZING OR SHORTNESS OF BREATH, Disp: 8.5 g, Rfl: 0   amoxicillin-clavulanate (AUGMENTIN) 875-125 MG tablet, Take 1 tablet by mouth 2 (two) times daily., Disp: 14 tablet, Rfl: 0   aspirin EC 81 MG tablet, Take 1 tablet (81 mg total) by mouth 2 (two) times daily. Swallow whole., Disp: 84 tablet, Rfl: 0   Cyanocobalamin (B-12) 1000 MCG SUBL, Place 1 tablet under the tongue 3 (three) times a week., Disp: 100 tablet, Rfl: 1   escitalopram (LEXAPRO) 10 MG tablet, Take 1 tablet (10 mg total) by mouth daily., Disp: 90 tablet, Rfl: 0   folic acid (FOLVITE) 1 MG tablet, Take 1 mg by mouth daily., Disp: , Rfl:    hydroxychloroquine (PLAQUENIL) 200 MG tablet, Take 200 mg by mouth 2 (two) times daily., Disp: , Rfl:    metFORMIN (GLUCOPHAGE-XR) 500 MG 24 hr tablet, TAKE 1 TABLET(500 MG) BY MOUTH DAILY WITH BREAKFAST, Disp: 90 tablet, Rfl: 1   methotrexate (RHEUMATREX) 2.5 MG tablet, Take 12.5 mg by mouth once a week. Caution:Chemotherapy. Protect from light., Disp: , Rfl:    ondansetron (ZOFRAN) 4  MG tablet, Take 1 tablet (4 mg total) by mouth every 8 (eight) hours as needed for nausea or vomiting., Disp: 20 tablet, Rfl: 0   Rimegepant Sulfate (NURTEC) 75 MG TBDP, Take 1 tablet by mouth daily as needed. Max of 2 in 24 hours, Disp: 16 tablet, Rfl: 2   topiramate (TOPAMAX) 50 MG tablet, Take 1 tablet (50 mg total) by mouth 2 (two) times daily., Disp: 180 tablet, Rfl: 1   Vitamin D, Ergocalciferol, (DRISDOL) 1.25 MG (50000  UNIT) CAPS capsule, TAKE 1 CAPSULE BY MOUTH EVERY 7 DAYS, Disp: 12 capsule, Rfl: 1  Allergies  Allergen Reactions   Corylus Anaphylaxis    ([place])   Hydrocodone Anaphylaxis   Pumpkin Seed Anaphylaxis    Pumpkin     ROS  ***  Objective  There were no vitals filed for this visit.  There is no height or weight on file to calculate BMI.  Physical Exam ***  Recent Results (from the past 2160 hour(s))  Glucose, capillary     Status: Abnormal   Collection Time: 10/10/22  8:16 AM  Result Value Ref Range   Glucose-Capillary 109 (H) 70 - 99 mg/dL    Comment: Glucose reference range applies only to samples taken after fasting for at least 8 hours.     Fall Risk:    10/02/2022    2:38 PM 09/17/2022    1:36 PM 09/09/2022    9:38 AM 09/03/2022   11:10 AM 08/29/2022    2:09 PM  Fall Risk   Falls in the past year? 0 0 0 0 0  Number falls in past yr:  0 0 0 0  Injury with Fall?  0 0 0 0  Risk for fall due to : Impaired mobility;Impaired balance/gait;Orthopedic patient No Fall Risks No Fall Risks No Fall Risks   Follow up Falls prevention discussed Falls prevention discussed Falls prevention discussed;Education provided;Falls evaluation completed Falls prevention discussed;Education provided;Falls evaluation completed Falls evaluation completed     Functional Status Survey:     Assessment & Plan  1. Well adult exam ***   -USPSTF grade A and B recommendations reviewed with patient; age-appropriate recommendations, preventive care, screening tests, etc discussed and encouraged; healthy living encouraged; see AVS for patient education given to patient -Discussed importance of 150 minutes of physical activity weekly, eat two servings of fish weekly, eat one serving of tree nuts ( cashews, pistachios, pecans, almonds.Marland Kitchen) every other day, eat 6 servings of fruit/vegetables daily and drink plenty of water and avoid sweet beverages.   -Reviewed Health Maintenance: Yes.

## 2022-10-31 NOTE — Patient Instructions (Incomplete)

## 2022-11-01 ENCOUNTER — Encounter: Payer: Managed Care, Other (non HMO) | Admitting: Family Medicine

## 2022-12-06 NOTE — Patient Instructions (Incomplete)
Preventive Care 42-42 Years Old, Female Preventive care refers to lifestyle choices and visits with your health care provider that can promote health and wellness. Preventive care visits are also called wellness exams. What can I expect for my preventive care visit? Counseling Your health care provider may ask you questions about your: Medical history, including: Past medical problems. Family medical history. Pregnancy history. Current health, including: Menstrual cycle. Method of birth control. Emotional well-being. Home life and relationship well-being. Sexual activity and sexual health. Lifestyle, including: Alcohol, nicotine or tobacco, and drug use. Access to firearms. Diet, exercise, and sleep habits. Work and work environment. Sunscreen use. Safety issues such as seatbelt and bike helmet use. Physical exam Your health care provider will check your: Height and weight. These may be used to calculate your BMI (body mass index). BMI is a measurement that tells if you are at a healthy weight. Waist circumference. This measures the distance around your waistline. This measurement also tells if you are at a healthy weight and may help predict your risk of certain diseases, such as type 2 diabetes and high blood pressure. Heart rate and blood pressure. Body temperature. Skin for abnormal spots. What immunizations do I need?  Vaccines are usually given at various ages, according to a schedule. Your health care provider will recommend vaccines for you based on your age, medical history, and lifestyle or other factors, such as travel or where you work. What tests do I need? Screening Your health care provider may recommend screening tests for certain conditions. This may include: Lipid and cholesterol levels. Diabetes screening. This is done by checking your blood sugar (glucose) after you have not eaten for a while (fasting). Pelvic exam and Pap test. Hepatitis B test. Hepatitis C  test. HIV (human immunodeficiency virus) test. STI (sexually transmitted infection) testing, if you are at risk. Lung cancer screening. Colorectal cancer screening. Mammogram. Talk with your health care provider about when you should start having regular mammograms. This may depend on whether you have a family history of breast cancer. BRCA-related cancer screening. This may be done if you have a family history of breast, ovarian, tubal, or peritoneal cancers. Bone density scan. This is done to screen for osteoporosis. Talk with your health care provider about your test results, treatment options, and if necessary, the need for more tests. Follow these instructions at home: Eating and drinking  Eat a diet that includes fresh fruits and vegetables, whole grains, lean protein, and low-fat dairy products. Take vitamin and mineral supplements as recommended by your health care provider. Do not drink alcohol if: Your health care provider tells you not to drink. You are pregnant, may be pregnant, or are planning to become pregnant. If you drink alcohol: Limit how much you have to 0-1 drink a day. Know how much alcohol is in your drink. In the [place] one drink equals one 12 oz bottle of beer (355 mL), one 5 oz glass of wine (148 mL), or one 1 oz glass of hard liquor (44 mL). Lifestyle Brush your teeth every morning and night with fluoride toothpaste. Floss one time each day. Exercise for at least 30 minutes 5 or more days each week. Do not use any products that contain nicotine or tobacco. These products include cigarettes, chewing tobacco, and vaping devices, such as e-cigarettes. If you need help quitting, ask your health care provider. Do not use drugs. If you are sexually active, practice safe sex. Use a condom or other form of protection to   prevent STIs. If you do not wish to become pregnant, use a form of birth control. If you plan to become pregnant, see your health care provider for a  prepregnancy visit. Take aspirin only as told by your health care provider. Make sure that you understand how much to take and what form to take. Work with your health care provider to find out whether it is safe and beneficial for you to take aspirin daily. Find healthy ways to manage stress, such as: Meditation, yoga, or listening to music. Journaling. Talking to a trusted person. Spending time with friends and family. Minimize exposure to UV radiation to reduce your risk of skin cancer. Safety Always wear your seat belt while driving or riding in a vehicle. Do not drive: If you have been drinking alcohol. Do not ride with someone who has been drinking. When you are tired or distracted. While texting. If you have been using any mind-altering substances or drugs. Wear a helmet and other protective equipment during sports activities. If you have firearms in your house, make sure you follow all gun safety procedures. Seek help if you have been physically or sexually abused. What's next? Visit your health care provider once a year for an annual wellness visit. Ask your health care provider how often you should have your eyes and teeth checked. Stay up to date on all vaccines. This information is not intended to replace advice given to you by your health care provider. Make sure you discuss any questions you have with your health care provider. Document Revised: 04/04/2021 Document Reviewed: 04/04/2021 Elsevier Patient Education  2023 [employer]  

## 2022-12-06 NOTE — Progress Notes (Deleted)
Name: Virginia Walls   MRN: [id number]    DOB: February 02, 1981   Date:12/06/2022       Progress Note  Subjective  Chief Complaint  Annual Exam  HPI  Patient presents for annual CPE.  Diet: *** Exercise: ***  Last Eye Exam: *** Last Dental Exam: ***  Flowsheet Row Video Visit from 08/29/2022 in [facility]  AUDIT-C Score 0      Depression: Phq 9 is  {Desc; negative/positive:13464}    10/02/2022    2:43 PM 09/17/2022    1:36 PM 09/09/2022    9:39 AM 09/03/2022   11:10 AM 08/29/2022    2:10 PM  Depression screen PHQ 2/9  Decreased Interest 1 1 2 $ 0 0  Down, Depressed, Hopeless 1 1 2 $ 0 0  PHQ - 2 Score 2 2 4 $ 0 0  Altered sleeping 1 2 2 $ 0   Tired, decreased energy 1 1 2 $ 0   Change in appetite 1 0 0 0   Feeling bad or failure about yourself  1 1 0 0   Trouble concentrating 1 1 0 0   Moving slowly or fidgety/restless 0 0 0 0   Suicidal thoughts 0 0 0 0   PHQ-9 Score 7 7 8 $ 0   Difficult doing work/chores Not difficult at all  Somewhat difficult Not difficult at all    Hypertension: BP Readings from Last 3 Encounters:  10/10/22 131/85  10/02/22 122/78  09/09/22 108/76   Obesity: Wt Readings from Last 3 Encounters:  10/10/22 175 lb 8 oz (79.6 kg)  10/02/22 179 lb 4.8 oz (81.3 kg)  09/17/22 172 lb (78 kg)   BMI Readings from Last 3 Encounters:  10/10/22 30.11 kg/m  10/02/22 30.78 kg/m  09/17/22 30.47 kg/m     Vaccines:   HPV: N/A Tdap: up to date Shingrix: N/A Pneumonia: N/A Flu: up to date COVID-19: up to date   Hep C Screening: 06/13/20 STD testing and prevention (HIV/chl/gon/syphilis): 06/16/12 Intimate partner violence: negative screen  Sexual History : Menstrual History/LMP/Abnormal Bleeding:  Discussed importance of follow up if any post-menopausal bleeding: yes  Incontinence Symptoms: negative for symptoms   Breast cancer:  - Last Mammogram: 03/01/22 - BRCA gene screening: N/A  Osteoporosis Prevention : Discussed high  calcium and vitamin D supplementation, weight bearing exercises Bone density: N/A   Cervical cancer screening: N/A  Skin cancer: Discussed monitoring for atypical lesions  Colorectal cancer: N/A   Lung cancer:  Low Dose CT Chest recommended if Age 19-80 years, 20 pack-year currently smoking OR have quit w/in 15years. Patient does not qualify for screen   ECG: 06/13/20  Advanced Care Planning: A voluntary discussion about advance care planning including the explanation and discussion of advance directives.  Discussed health care proxy and Living will, and the patient was able to identify a health care proxy as ***.  Patient does not have a living will and power of attorney of health care   Lipids: Lab Results  Component Value Date   CHOL 231 (H) 10/31/2021   CHOL 208 (H) 04/14/2020   CHOL 192 07/24/2017   Lab Results  Component Value Date   HDL 66 10/31/2021   HDL 61 04/14/2020   HDL 50 (L) 07/24/2017   Lab Results  Component Value Date   LDLCALC 150 (H) 10/31/2021   LDLCALC 131 (H) 04/14/2020   LDLCALC 125 (H) 07/24/2017   Lab Results  Component Value Date   TRIG 87 10/31/2021  TRIG 90 04/14/2020   TRIG 78 07/24/2017   Lab Results  Component Value Date   CHOLHDL 3.5 10/31/2021   CHOLHDL 3.4 04/14/2020   CHOLHDL 3.8 07/24/2017   No results found for: "LDLDIRECT"  Glucose: Glucose  Date Value Ref Range Status  10/31/2021 102 (H) 70 - 99 mg/dL Final  08/20/2013 86 65 - 99 mg/dL Final   Glucose, Bld  Date Value Ref Range Status  07/24/2017 85 65 - 99 mg/dL Final    Comment:    .            Fasting reference interval .   04/20/2015 96 65 - 99 mg/dL Final  02/24/2015 100 (H) 65 - 99 mg/dL Final   Glucose-Capillary  Date Value Ref Range Status  10/10/2022 109 (H) 70 - 99 mg/dL Final    Comment:    Glucose reference range applies only to samples taken after fasting for at least 8 hours.    Patient Active Problem List   Diagnosis Date Noted   Closed  fracture of base of fifth metatarsal bone of right foot 10/02/2022   Rheumatoid arthritis of right hand ([place]) 09/17/2022   Low serum vitamin B12 09/17/2022   Insulin resistance 09/17/2022   Intertrigo 04/09/2022   Bacterial vaginosis 04/09/2022   Effusion of left knee 04/09/2022   Migraine without aura and without status migrainosus, not intractable 04/09/2022   Inflammatory arthritis 02/25/2022   Noncompliance with medication regimen 01/16/2021   Moderate recurrent major depression ([place]) 04/11/2020   GAD (generalized anxiety disorder) 04/11/2020   Bereavement 04/11/2020   At risk for prolonged QT interval syndrome 04/11/2020   High risk medication use 04/11/2020   Chronic constipation 08/12/2017   Hyperglycemia 05/04/2015   Tattoos 05/04/2015   Headache, migraine 04/29/2015   Restless leg 04/29/2015   Snores 04/29/2015   Vitamin D deficiency 04/29/2015    Past Surgical History:  Procedure Laterality Date   ABDOMINAL HYSTERECTOMY     BREAST BIOPSY Left 03/21/2022   US biopsy/ ribbon clip/ path pending   ORIF TOE FRACTURE Right 10/10/2022   Procedure: OPEN REDUCTION INTERNAL FIXATION (ORIF) METATARSAL (TOE) FRACTURE - FIFTH;  Surgeon: Caroline More, DPM;  Location: [place];  Service: Podiatry;  Laterality: Right;   TUBAL LIGATION      Family History  Problem Relation Age of Onset   Heart disease Mother    Asthma Mother    Hypothyroidism Mother    Bipolar disorder Mother    Hypothyroidism Sister    Diabetes Sister    Hyperlipidemia Sister    Bipolar disorder Maternal Aunt    Bipolar disorder Maternal Uncle    Breast cancer Maternal Grandmother    Heart disease Paternal Grandmother        Pacemaker   Leukemia Paternal Grandmother    Diabetes Paternal Grandmother    Asthma Son     Social History   Socioeconomic History   Marital status: Single    Spouse name: Not on file   Number of children: 3   Years of education: Not on file   Highest education  level: Not on file  Occupational History   Occupation: customer service specialist   Tobacco Use   Smoking status: Former    Packs/day: 0.25    Years: 4.00    Total pack years: 1.00    Types: Cigarettes    Start date: 05/04/2011    Quit date: 09/30/2022    Years since quitting: 0.1   Smokeless tobacco:  Never  Vaping Use   Vaping Use: Never used  Substance and Sexual Activity   Alcohol use: Not Currently    Comment: occasionally   Drug use: No   Sexual activity: Yes    Partners: Male  Other Topics Concern   Not on file  Social History Narrative   Lives with same person / boyfriend for the past 18 years   She has three son. 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    Youngest sone died at age 83 from Leukemia    Social Determinants of Health   Financial Resource Strain: Low Risk  (10/31/2021)   Overall Financial Resource Strain (CARDIA)    Difficulty of Paying Living Expenses: Not hard at all  Food Insecurity: No Food Insecurity (10/31/2021)   Hunger Vital Sign    Worried About Running Out of Food in the Last Year: Never true    Ran Out of Food in the Last Year: Never true  Transportation Needs: No Transportation Needs (10/31/2021)   PRAPARE - Hydrologist (Medical): No    Lack of Transportation (Non-Medical): No  Physical Activity: Insufficiently Active (10/31/2021)   Exercise Vital Sign    Days of Exercise per Week: 2 days    Minutes of Exercise per Session: 20 min  Stress: Stress Concern Present (10/31/2021)   [place]    Feeling of Stress : Rather much  Social Connections: Moderately Isolated (10/31/2021)   Social Connection and Isolation Panel [NHANES]    Frequency of Communication with Friends and Family: Twice a week    Frequency of Social Gatherings with Friends and Family: Once a week    Attends Religious  Services: Never    Marine scientist or Organizations: No    Attends Archivist Meetings: Never    Marital Status: Living with partner  Intimate Partner Violence: Not At Risk (10/31/2021)   Humiliation, Afraid, Rape, and Kick questionnaire    Fear of Current or Ex-Partner: No    Emotionally Abused: No    Physically Abused: No    Sexually Abused: No     Current Outpatient Medications:    albuterol (VENTOLIN HFA) 108 (90 Base) MCG/ACT inhaler, INHALE 2 PUFFS INTO THE LUNGS EVERY 6 HOURS AS NEEDED FOR WHEEZING OR SHORTNESS OF BREATH, Disp: 8.5 g, Rfl: 0   amoxicillin-clavulanate (AUGMENTIN) 875-125 MG tablet, Take 1 tablet by mouth 2 (two) times daily., Disp: 14 tablet, Rfl: 0   Cyanocobalamin (B-12) 1000 MCG SUBL, Place 1 tablet under the tongue 3 (three) times a week., Disp: 100 tablet, Rfl: 1   escitalopram (LEXAPRO) 10 MG tablet, Take 1 tablet (10 mg total) by mouth daily., Disp: 90 tablet, Rfl: 0   folic acid (FOLVITE) 1 MG tablet, Take 1 mg by mouth daily., Disp: , Rfl:    hydroxychloroquine (PLAQUENIL) 200 MG tablet, Take 200 mg by mouth 2 (two) times daily., Disp: , Rfl:    metFORMIN (GLUCOPHAGE-XR) 500 MG 24 hr tablet, TAKE 1 TABLET(500 MG) BY MOUTH DAILY WITH BREAKFAST, Disp: 90 tablet, Rfl: 1   methotrexate (RHEUMATREX) 2.5 MG tablet, Take 12.5 mg by mouth once a week. Caution:Chemotherapy. Protect from light., Disp: , Rfl:    ondansetron (ZOFRAN) 4 MG tablet, Take 1 tablet (4 mg total) by mouth every 8 (eight) hours as needed for nausea or vomiting., Disp: 20 tablet, Rfl:  0   Rimegepant Sulfate (NURTEC) 75 MG TBDP, Take 1 tablet by mouth daily as needed. Max of 2 in 24 hours, Disp: 16 tablet, Rfl: 2   topiramate (TOPAMAX) 50 MG tablet, Take 1 tablet (50 mg total) by mouth 2 (two) times daily., Disp: 180 tablet, Rfl: 1   Vitamin D, Ergocalciferol, (DRISDOL) 1.25 MG (50000 UNIT) CAPS capsule, TAKE 1 CAPSULE BY MOUTH EVERY 7 DAYS, Disp: 12 capsule, Rfl: 1  Allergies   Allergen Reactions   Corylus Anaphylaxis    ([place])   Hydrocodone Anaphylaxis   Pumpkin Seed Anaphylaxis    Pumpkin     ROS  ***  Objective  There were no vitals filed for this visit.  There is no height or weight on file to calculate BMI.  Physical Exam ***  Recent Results (from the past 2160 hour(s))  Glucose, capillary     Status: Abnormal   Collection Time: 10/10/22  8:16 AM  Result Value Ref Range   Glucose-Capillary 109 (H) 70 - 99 mg/dL    Comment: Glucose reference range applies only to samples taken after fasting for at least 8 hours.     Fall Risk:    10/02/2022    2:38 PM 09/17/2022    1:36 PM 09/09/2022    9:38 AM 09/03/2022   11:10 AM 08/29/2022    2:09 PM  Fall Risk   Falls in the past year? 0 0 0 0 0  Number falls in past yr:  0 0 0 0  Injury with Fall?  0 0 0 0  Risk for fall due to : Impaired mobility;Impaired balance/gait;Orthopedic patient No Fall Risks No Fall Risks No Fall Risks   Follow up Falls prevention discussed Falls prevention discussed Falls prevention discussed;Education provided;Falls evaluation completed Falls prevention discussed;Education provided;Falls evaluation completed Falls evaluation completed     Functional Status Survey:     Assessment & Plan  1. Well adult exam ***   -USPSTF grade A and B recommendations reviewed with patient; age-appropriate recommendations, preventive care, screening tests, etc discussed and encouraged; healthy living encouraged; see AVS for patient education given to patient -Discussed importance of 150 minutes of physical activity weekly, eat two servings of fish weekly, eat one serving of tree nuts ( cashews, pistachios, pecans, almonds.Marland Kitchen) every other day, eat 6 servings of fruit/vegetables daily and drink plenty of water and avoid sweet beverages.   -Reviewed Health Maintenance: Yes.

## 2022-12-09 ENCOUNTER — Encounter: Payer: Managed Care, Other (non HMO) | Admitting: Family Medicine

## 2022-12-16 ENCOUNTER — Other Ambulatory Visit: Payer: Self-pay | Admitting: Family Medicine

## 2022-12-16 DIAGNOSIS — F331 Major depressive disorder, recurrent, moderate: Secondary | ICD-10-CM

## 2022-12-24 ENCOUNTER — Ambulatory Visit: Payer: Managed Care, Other (non HMO) | Admitting: Family Medicine

## 2023-01-17 ENCOUNTER — Other Ambulatory Visit: Payer: Self-pay | Admitting: Family Medicine

## 2023-01-17 DIAGNOSIS — F331 Major depressive disorder, recurrent, moderate: Secondary | ICD-10-CM

## 2023-02-03 ENCOUNTER — Ambulatory Visit: Payer: Managed Care, Other (non HMO) | Admitting: Family Medicine

## 2023-02-05 ENCOUNTER — Encounter: Payer: Managed Care, Other (non HMO) | Admitting: Family Medicine

## 2023-02-21 ENCOUNTER — Other Ambulatory Visit: Payer: Self-pay | Admitting: Family Medicine

## 2023-02-21 DIAGNOSIS — F331 Major depressive disorder, recurrent, moderate: Secondary | ICD-10-CM

## 2023-03-03 ENCOUNTER — Other Ambulatory Visit: Payer: Self-pay

## 2023-03-03 ENCOUNTER — Other Ambulatory Visit: Payer: Self-pay | Admitting: Family Medicine

## 2023-03-03 DIAGNOSIS — F331 Major depressive disorder, recurrent, moderate: Secondary | ICD-10-CM

## 2023-03-03 MED ORDER — ESCITALOPRAM OXALATE 10 MG PO TABS: ORAL_TABLET | ORAL | 0 refills | Status: DC

## 2023-03-03 NOTE — Telephone Encounter (Unsigned)
Copied from CRM [id number]. Topic: General - Other >> Mar 03, 2023 12:51 PM Everette C wrote: Reason for CRM: Medication Refill - Medication: escitalopram (LEXAPRO) 10 MG tablet [[id number]]  Has the patient contacted their pharmacy? Yes.  The patient has called to share that their prescription for escitalopram (LEXAPRO) 10 MG tablet [[id number]] can only be covered by insruace in 90 day quantities   (Agent: If no, request that the patient contact the pharmacy for the refill. If patient does not wish to contact the pharmacy document the reason why and proceed with request.) (Agent: If yes, when and what did the pharmacy advise?)  Preferred Pharmacy (with phone number or street name): [id number] - [place], [place] - [address] AT [facility] [address] [facility] [address] [place] [address] Phone: [phone] Fax: [phone] Hours: Not open 24 hours   Has the patient been seen for an appointment in the last year OR does the patient have an upcoming appointment? Yes.    Agent: Please be advised that RX refills may take up to 3 business days. We ask that you follow-up with your pharmacy.

## 2023-03-04 NOTE — Telephone Encounter (Signed)
Last RF 03/03/23 #30   Requested Prescriptions  Refused Prescriptions Disp Refills   escitalopram (LEXAPRO) 10 MG tablet 30 tablet 0    Sig: TAKE 1 TABLET(10 MG) BY MOUTH DAILY     Psychiatry:  Antidepressants - SSRI Passed - 03/03/2023  1:37 PM      Passed - Completed PHQ-2 or PHQ-9 in the last 360 days      Passed - Valid encounter within last 6 months    Recent Outpatient Visits           5 months ago Closed fracture of base of fifth metatarsal bone of right foot   [facility] Alba Cory, MD   5 months ago Rheumatoid arthritis of right hand [facility])   [place] [facility] Alba Cory, MD   5 months ago Acute bronchitis, unspecified organism   [facility] Danelle Berry, PA-C   6 months ago Upper respiratory tract infection, unspecified type   [facility] Danelle Berry, PA-C   6 months ago Viral upper respiratory tract infection   [facility] Berniece Salines, FNP       Future Appointments             In 3 weeks Alba Cory, MD [facility], [facility]

## 2023-03-19 ENCOUNTER — Other Ambulatory Visit: Payer: Self-pay | Admitting: Family Medicine

## 2023-03-19 DIAGNOSIS — G43009 Migraine without aura, not intractable, without status migrainosus: Secondary | ICD-10-CM

## 2023-03-19 DIAGNOSIS — E88819 Insulin resistance, unspecified: Secondary | ICD-10-CM

## 2023-03-22 ENCOUNTER — Other Ambulatory Visit: Payer: Self-pay | Admitting: Family Medicine

## 2023-03-22 DIAGNOSIS — E559 Vitamin D deficiency, unspecified: Secondary | ICD-10-CM

## 2023-03-26 NOTE — Progress Notes (Unsigned)
Name: Virginia Walls   MRN: [id number]    DOB: Feb 15, 1981   Date:03/27/2023       Progress Note  Subjective  Chief Complaint  Annual Exam  HPI  Patient presents for annual CPE.  Diet: she will join weight watchers again  Exercise: she needs to resume regular physical activity   Last Eye Exam: reminded her to schedule it  Last Dental Exam: due for a dental exam   Flowsheet Row Office Visit from 03/27/2023 in [facility]  AUDIT-C Score 0      Depression: Phq 9 is  positive    03/27/2023    9:38 AM 10/02/2022    2:43 PM 09/17/2022    1:36 PM 09/09/2022    9:39 AM 09/03/2022   11:10 AM  Depression screen PHQ 2/9  Decreased Interest 0 1 1 2  0  Down, Depressed, Hopeless 0 1 1 2  0  PHQ - 2 Score 0 2 2 4  0  Altered sleeping 0 1 2 2  0  Tired, decreased energy 0 1 1 2  0  Change in appetite 0 1 0 0 0  Feeling bad or failure about yourself  0 1 1 0 0  Trouble concentrating 0 1 1 0 0  Moving slowly or fidgety/restless 0 0 0 0 0  Suicidal thoughts 0 0 0 0 0  PHQ-9 Score 0 7 7 8  0  Difficult doing work/chores Not difficult at all Not difficult at all  Somewhat difficult Not difficult at all   Hypertension: BP Readings from Last 3 Encounters:  03/27/23 118/74  10/10/22 131/85  10/02/22 122/78   Obesity: Wt Readings from Last 3 Encounters:  03/27/23 182 lb (82.6 kg)  10/10/22 175 lb 8 oz (79.6 kg)  10/02/22 179 lb 4.8 oz (81.3 kg)   BMI Readings from Last 3 Encounters:  03/27/23 31.24 kg/m  10/10/22 30.11 kg/m  10/02/22 30.78 kg/m     Vaccines:   Tdap: up to date Shingrix: N/A Pneumonia: up to date Flu: up to date COVID-19: up to date   Hep C Screening: 06/13/20 STD testing and prevention (HIV/chl/gon/syphilis): 06/16/12 Intimate partner violence: negative screen  Sexual History : one partner for many years  Menstrual History/LMP/Abnormal Bleeding: s/p hysterectomy  Discussed importance of follow up if any post-menopausal bleeding: yes   Incontinence Symptoms: negative for symptoms   Breast cancer:  - Last Mammogram: 03/01/22 - she is due for a repeat  - BRCA gene screening: N/A  Osteoporosis Prevention : Discussed high calcium and vitamin D supplementation, weight bearing exercises Bone density: N/A   Cervical cancer screening: s/p hysterectomy   Skin cancer: Discussed monitoring for atypical lesions  Colorectal cancer: N/A   Lung cancer:  Low Dose CT Chest recommended if Age 72-80 years, 20 pack-year currently smoking OR have quit w/in 15years. Patient does not qualify for screen   ECG: 06/13/20  Advanced Care Planning: A voluntary discussion about advance care planning including the explanation and discussion of advance directives.  Discussed health care proxy and Living will, and the patient was able to identify a health care proxy as mother .  Patient does not have a living will and power of attorney of health care   Lipids: Lab Results  Component Value Date   CHOL 231 (H) 10/31/2021   CHOL 208 (H) 04/14/2020   CHOL 192 07/24/2017   Lab Results  Component Value Date   HDL 66 10/31/2021   HDL 61 04/14/2020   HDL  50 (L) 07/24/2017   Lab Results  Component Value Date   LDLCALC 150 (H) 10/31/2021   LDLCALC 131 (H) 04/14/2020   LDLCALC 125 (H) 07/24/2017   Lab Results  Component Value Date   TRIG 87 10/31/2021   TRIG 90 04/14/2020   TRIG 78 07/24/2017   Lab Results  Component Value Date   CHOLHDL 3.5 10/31/2021   CHOLHDL 3.4 04/14/2020   CHOLHDL 3.8 07/24/2017   No results found for: "LDLDIRECT"  Glucose: Glucose  Date Value Ref Range Status  10/31/2021 102 (H) 70 - 99 mg/dL Final  16/07/9603 86 65 - 99 mg/dL Final   Glucose, Bld  Date Value Ref Range Status  07/24/2017 85 65 - 99 mg/dL Final    Comment:    .            Fasting reference interval .   04/20/2015 96 65 - 99 mg/dL Final  54/06/8118 147 (H) 65 - 99 mg/dL Final   Glucose-Capillary  Date Value Ref Range Status   10/10/2022 109 (H) 70 - 99 mg/dL Final    Comment:    Glucose reference range applies only to samples taken after fasting for at least 8 hours.    Patient Active Problem List   Diagnosis Date Noted   Closed fracture of base of fifth metatarsal bone of right foot 10/02/2022   Rheumatoid arthritis of right hand (HCC) 09/17/2022   Low serum vitamin B12 09/17/2022   Insulin resistance 09/17/2022   Intertrigo 04/09/2022   Bacterial vaginosis 04/09/2022   Effusion of left knee 04/09/2022   Migraine without aura and without status migrainosus, not intractable 04/09/2022   Inflammatory arthritis 02/25/2022   Noncompliance with medication regimen 01/16/2021   Moderate recurrent major depression (HCC) 04/11/2020   GAD (generalized anxiety disorder) 04/11/2020   Bereavement 04/11/2020   At risk for prolonged QT interval syndrome 04/11/2020   High risk medication use 04/11/2020   Chronic constipation 08/12/2017   Hyperglycemia 05/04/2015   Tattoos 05/04/2015   Headache, migraine 04/29/2015   Restless leg 04/29/2015   Snores 04/29/2015   Vitamin D deficiency 04/29/2015    Past Surgical History:  Procedure Laterality Date   ABDOMINAL HYSTERECTOMY     BREAST BIOPSY Left 03/21/2022   US biopsy/ ribbon clip/ path pending   ORIF TOE FRACTURE Right 10/10/2022   Procedure: OPEN REDUCTION INTERNAL FIXATION (ORIF) METATARSAL (TOE) FRACTURE - FIFTH;  Surgeon: Rosetta Posner, DPM;  Location: [facility];  Service: Podiatry;  Laterality: Right;   TUBAL LIGATION      Family History  Problem Relation Age of Onset   Heart disease Mother    Asthma Mother    Hypothyroidism Mother    Bipolar disorder Mother    Hypothyroidism Sister    Diabetes Sister    Hyperlipidemia Sister    Bipolar disorder Maternal Aunt    Bipolar disorder Maternal Uncle    Breast cancer Maternal Grandmother    Heart disease Paternal Grandmother        Pacemaker   Leukemia Paternal Grandmother    Diabetes  Paternal Grandmother    Asthma Son     Social History   Socioeconomic History   Marital status: Single    Spouse name: Not on file   Number of children: 3   Years of education: Not on file   Highest education level: Not on file  Occupational History   Occupation: customer service specialist   Tobacco Use   Smoking status: Former  Packs/day: 0.25    Years: 4.00    Additional pack years: 0.00    Total pack years: 1.00    Types: Cigarettes    Start date: 05/04/2011    Quit date: 09/30/2022    Years since quitting: 0.4   Smokeless tobacco: Never  Vaping Use   Vaping Use: Never used  Substance and Sexual Activity   Alcohol use: Not Currently    Comment: occasionally   Drug use: No   Sexual activity: Yes    Partners: Male  Other Topics Concern   Not on file  Social History Narrative   Lives with same person / boyfriend for the past 18 years   She has three son. 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    Youngest sone died at age 38 from Leukemia    Social Determinants of Health   Financial Resource Strain: Low Risk  (03/27/2023)   Overall Financial Resource Strain (CARDIA)    Difficulty of Paying Living Expenses: Not hard at all  Food Insecurity: No Food Insecurity (03/27/2023)   Hunger Vital Sign    Worried About Running Out of Food in the Last Year: Never true    Ran Out of Food in the Last Year: Never true  Transportation Needs: No Transportation Needs (03/27/2023)   PRAPARE - Administrator, Civil Service (Medical): No    Lack of Transportation (Non-Medical): No  Physical Activity: Insufficiently Active (03/27/2023)   Exercise Vital Sign    Days of Exercise per Week: 2 days    Minutes of Exercise per Session: 30 min  Stress: Stress Concern Present (03/27/2023)   [employer] of Occupational Health - Occupational Stress Questionnaire    Feeling of Stress : To some extent  Social Connections:  Moderately Isolated (03/27/2023)   Social Connection and Isolation Panel [NHANES]    Frequency of Communication with Friends and Family: Twice a week    Frequency of Social Gatherings with Friends and Family: Twice a week    Attends Religious Services: Never    Database administrator or Organizations: No    Attends Banker Meetings: Never    Marital Status: Living with partner  Intimate Partner Violence: Not At Risk (03/27/2023)   Humiliation, Afraid, Rape, and Kick questionnaire    Fear of Current or Ex-Partner: No    Emotionally Abused: No    Physically Abused: No    Sexually Abused: No     Current Outpatient Medications:    Cyanocobalamin (B-12) 1000 MCG SUBL, Place 1 tablet under the tongue 3 (three) times a week., Disp: 100 tablet, Rfl: 1   escitalopram (LEXAPRO) 10 MG tablet, TAKE 1 TABLET(10 MG) BY MOUTH DAILY, Disp: 30 tablet, Rfl: 0   folic acid (FOLVITE) 1 MG tablet, Take 1 mg by mouth daily., Disp: , Rfl:    hydroxychloroquine (PLAQUENIL) 200 MG tablet, Take 200 mg by mouth 2 (two) times daily., Disp: , Rfl:    metFORMIN (GLUCOPHAGE-XR) 500 MG 24 hr tablet, TAKE 1 TABLET(500 MG) BY MOUTH DAILY WITH BREAKFAST, Disp: 90 tablet, Rfl: 1   methotrexate (RHEUMATREX) 2.5 MG tablet, Take 12.5 mg by mouth once a week. Caution:Chemotherapy. Protect from light., Disp: , Rfl:    Rimegepant Sulfate (NURTEC) 75 MG TBDP, Take 1 tablet by mouth daily as needed. Max of 2 in 24 hours, Disp: 16 tablet, Rfl: 2   topiramate (TOPAMAX) 50 MG tablet,  TAKE 1 TABLET(50 MG) BY MOUTH TWICE DAILY, Disp: 180 tablet, Rfl: 1   Vitamin D, Ergocalciferol, (DRISDOL) 1.25 MG (50000 UNIT) CAPS capsule, TAKE 1 CAPSULE BY MOUTH EVERY 7 DAYS, Disp: 12 capsule, Rfl: 1  Allergies  Allergen Reactions   Corylus Anaphylaxis    (Hazelnut)   Hydrocodone Anaphylaxis   Pumpkin Seed Anaphylaxis    Pumpkin     ROS  Constitutional: Negative for fever , positive for weight change.  Respiratory: Negative for  cough and shortness of breath.   Cardiovascular: Negative for chest pain or palpitations.  Gastrointestinal: Negative for abdominal pain, no bowel changes.  Musculoskeletal: Negative for gait problem or joint swelling.  Skin: Negative for rash.  Neurological: Negative for dizziness or headache.  No other specific complaints in a complete review of systems (except as listed in HPI above).   Objective  Vitals:   03/27/23 0935  BP: 118/74  Pulse: 83  Resp: 16  Temp: 97.9 F (36.6 C)  TempSrc: Oral  SpO2: 100%  Weight: 182 lb (82.6 kg)  Height: 5\' 4"  (1.626 m)    Body mass index is 31.24 kg/m.  Physical Exam  Constitutional: Patient appears well-developed and well-nourished. Obese No distress.  HENT: Head: Normocephalic and atraumatic. Ears: B TMs ok, no erythema or effusion; Nose: Nose normal. Mouth/Throat: Oropharynx is clear and moist. No oropharyngeal exudate.  Eyes: Conjunctivae and EOM are normal. Pupils are equal, round, and reactive to light. No scleral icterus.  Neck: Normal range of motion. Neck supple. No JVD present. No thyromegaly present.  Cardiovascular: Normal rate, regular rhythm and normal heart sounds.  No murmur heard. No BLE edema. Pulmonary/Chest: Effort normal and breath sounds normal. No respiratory distress. Abdominal: Soft. Bowel sounds are normal, no distension. There is no tenderness. no masses Breast: no lumps or masses, no nipple discharge or rashes FEMALE GENITALIA:  Not done  RECTAL: not done  Musculoskeletal: Normal range of motion, no joint effusions. No gross deformities Neurological: he is alert and oriented to person, place, and time. No cranial nerve deficit. Coordination, balance, strength, speech and gait are normal.  Skin: Skin is warm and dry. No rash noted. No erythema.  Psychiatric: Patient has a normal mood and affect. behavior is normal. Judgment and thought content normal.    Fall Risk:    03/27/2023    9:38 AM 10/02/2022     2:38 PM 09/17/2022    1:36 PM 09/09/2022    9:38 AM 09/03/2022   11:10 AM  Fall Risk   Falls in the past year? 0 0 0 0 0  Number falls in past yr: 0  0 0 0  Injury with Fall?   0 0 0  Risk for fall due to :  Impaired mobility;Impaired balance/gait;Orthopedic patient No Fall Risks No Fall Risks No Fall Risks  Follow up  Falls prevention discussed Falls prevention discussed Falls prevention discussed;Education provided;Falls evaluation completed Falls prevention discussed;Education provided;Falls evaluation completed     Functional Status Survey: Is the patient deaf or have difficulty hearing?: No Does the patient have difficulty seeing, even when wearing glasses/contacts?: No Does the patient have difficulty concentrating, remembering, or making decisions?: No Does the patient have difficulty walking or climbing stairs?: No Does the patient have difficulty dressing or bathing?: No Does the patient have difficulty doing errands alone such as visiting a doctor's office or shopping?: No   Assessment & Plan  1. Well adult exam  - Comprehensive metabolic panel - N62 and Folate  Panel - Hemoglobin A1c - Lipid panel - VITAMIN D 25 Hydroxy (Vit-D Deficiency, Fractures)  2. Breast cancer screening by mammogram  - MM 3D SCREENING MAMMOGRAM BILATERAL BREAST; Future  3. Vitamin D deficiency  - VITAMIN D 25 Hydroxy (Vit-D Deficiency, Fractures)  4. Low serum vitamin B12  - B12 and Folate Panel  5. Dyslipidemia  - Lipid panel  6. Long-term use of high-risk medication  - Comprehensive metabolic panel  7. Screening for diabetes mellitus  - Hemoglobin A1c  8. Rheumatoid arthritis of right hand (HCC)  - CBC with Differential/Platelet - Sedimentation rate    -USPSTF grade A and B recommendations reviewed with patient; age-appropriate recommendations, preventive care, screening tests, etc discussed and encouraged; healthy living encouraged; see AVS for patient education given  to patient -Discussed importance of 150 minutes of physical activity weekly, eat two servings of fish weekly, eat one serving of tree nuts ( cashews, pistachios, pecans, almonds.Marland Kitchen) every other day, eat 6 servings of fruit/vegetables daily and drink plenty of water and avoid sweet beverages.   -Reviewed Health Maintenance: Yes.

## 2023-03-26 NOTE — Patient Instructions (Signed)
Preventive Care 40-42 Years Old, Female Preventive care refers to lifestyle choices and visits with your health care provider that can promote health and wellness. Preventive care visits are also called wellness exams. What can I expect for my preventive care visit? Counseling Your health care provider may ask you questions about your: Medical history, including: Past medical problems. Family medical history. Pregnancy history. Current health, including: Menstrual cycle. Method of birth control. Emotional well-being. Home life and relationship well-being. Sexual activity and sexual health. Lifestyle, including: Alcohol, nicotine or tobacco, and drug use. Access to firearms. Diet, exercise, and sleep habits. Work and work environment. Sunscreen use. Safety issues such as seatbelt and bike helmet use. Physical exam Your health care provider will check your: Height and weight. These may be used to calculate your BMI (body mass index). BMI is a measurement that tells if you are at a healthy weight. Waist circumference. This measures the distance around your waistline. This measurement also tells if you are at a healthy weight and may help predict your risk of certain diseases, such as type 2 diabetes and high blood pressure. Heart rate and blood pressure. Body temperature. Skin for abnormal spots. What immunizations do I need?  Vaccines are usually given at various ages, according to a schedule. Your health care provider will recommend vaccines for you based on your age, medical history, and lifestyle or other factors, such as travel or where you work. What tests do I need? Screening Your health care provider may recommend screening tests for certain conditions. This may include: Lipid and cholesterol levels. Diabetes screening. This is done by checking your blood sugar (glucose) after you have not eaten for a while (fasting). Pelvic exam and Pap test. Hepatitis B test. Hepatitis C  test. HIV (human immunodeficiency virus) test. STI (sexually transmitted infection) testing, if you are at risk. Lung cancer screening. Colorectal cancer screening. Mammogram. Talk with your health care provider about when you should start having regular mammograms. This may depend on whether you have a family history of breast cancer. BRCA-related cancer screening. This may be done if you have a family history of breast, ovarian, tubal, or peritoneal cancers. Bone density scan. This is done to screen for osteoporosis. Talk with your health care provider about your test results, treatment options, and if necessary, the need for more tests. Follow these instructions at home: Eating and drinking  Eat a diet that includes fresh fruits and vegetables, whole grains, lean protein, and low-fat dairy products. Take vitamin and mineral supplements as recommended by your health care provider. Do not drink alcohol if: Your health care provider tells you not to drink. You are pregnant, may be pregnant, or are planning to become pregnant. If you drink alcohol: Limit how much you have to 0-1 drink a day. Know how much alcohol is in your drink. In the [place] one drink equals one 12 oz bottle of beer (355 mL), one 5 oz glass of wine (148 mL), or one 1 oz glass of hard liquor (44 mL). Lifestyle Brush your teeth every morning and night with fluoride toothpaste. Floss one time each day. Exercise for at least 30 minutes 5 or more days each week. Do not use any products that contain nicotine or tobacco. These products include cigarettes, chewing tobacco, and vaping devices, such as e-cigarettes. If you need help quitting, ask your health care provider. Do not use drugs. If you are sexually active, practice safe sex. Use a condom or other form of protection to   prevent STIs. If you do not wish to become pregnant, use a form of birth control. If you plan to become pregnant, see your health care provider for a  prepregnancy visit. Take aspirin only as told by your health care provider. Make sure that you understand how much to take and what form to take. Work with your health care provider to find out whether it is safe and beneficial for you to take aspirin daily. Find healthy ways to manage stress, such as: Meditation, yoga, or listening to music. Journaling. Talking to a trusted person. Spending time with friends and family. Minimize exposure to UV radiation to reduce your risk of skin cancer. Safety Always wear your seat belt while driving or riding in a vehicle. Do not drive: If you have been drinking alcohol. Do not ride with someone who has been drinking. When you are tired or distracted. While texting. If you have been using any mind-altering substances or drugs. Wear a helmet and other protective equipment during sports activities. If you have firearms in your house, make sure you follow all gun safety procedures. Seek help if you have been physically or sexually abused. What's next? Visit your health care provider once a year for an annual wellness visit. Ask your health care provider how often you should have your eyes and teeth checked. Stay up to date on all vaccines. This information is not intended to replace advice given to you by your health care provider. Make sure you discuss any questions you have with your health care provider. Document Revised: 04/04/2021 Document Reviewed: 04/04/2021 Elsevier Patient Education  2024 Elsevier Inc.  

## 2023-03-27 ENCOUNTER — Ambulatory Visit (INDEPENDENT_AMBULATORY_CARE_PROVIDER_SITE_OTHER): Payer: Managed Care, Other (non HMO) | Admitting: Family Medicine

## 2023-03-27 ENCOUNTER — Encounter: Payer: Self-pay | Admitting: Family Medicine

## 2023-03-27 VITALS — BP 118/74 | HR 83 | Temp 97.9°F | Resp 16 | Ht 64.0 in | Wt 182.0 lb

## 2023-03-27 DIAGNOSIS — Z0000 Encounter for general adult medical examination without abnormal findings: Secondary | ICD-10-CM | POA: Diagnosis not present

## 2023-03-27 DIAGNOSIS — E538 Deficiency of other specified B group vitamins: Secondary | ICD-10-CM | POA: Diagnosis not present

## 2023-03-27 DIAGNOSIS — Z1231 Encounter for screening mammogram for malignant neoplasm of breast: Secondary | ICD-10-CM

## 2023-03-27 DIAGNOSIS — E559 Vitamin D deficiency, unspecified: Secondary | ICD-10-CM

## 2023-03-27 DIAGNOSIS — M069 Rheumatoid arthritis, unspecified: Secondary | ICD-10-CM

## 2023-03-27 DIAGNOSIS — Z131 Encounter for screening for diabetes mellitus: Secondary | ICD-10-CM

## 2023-03-27 DIAGNOSIS — Z79899 Other long term (current) drug therapy: Secondary | ICD-10-CM

## 2023-03-27 DIAGNOSIS — E785 Hyperlipidemia, unspecified: Secondary | ICD-10-CM

## 2023-03-28 LAB — B12 AND FOLATE PANEL
Folate: 19.6 ng/mL (ref >3.0)
Vitamin B-12: 1026 pg/mL (ref 232–1245)

## 2023-03-28 LAB — CBC WITH DIFFERENTIAL/PLATELET
Basophils Absolute: 0 10*3/uL (ref 0.0–0.2)
Basos: 1 %
EOS (ABSOLUTE): 0.1 10*3/uL (ref 0.0–0.4)
Eos: 2 %
Hematocrit: 40.8 % (ref 34.0–46.6)
Hemoglobin: 13.4 g/dL (ref 11.1–15.9)
Immature Grans (Abs): 0 10*3/uL (ref 0.0–0.1)
Immature Granulocytes: 0 %
Lymphocytes Absolute: 1.7 10*3/uL (ref 0.7–3.1)
Lymphs: 26 %
MCH: 31.6 pg (ref 26.6–33.0)
MCHC: 32.8 g/dL (ref 31.5–35.7)
MCV: 96 fL (ref 79–97)
Monocytes Absolute: 0.3 10*3/uL (ref 0.1–0.9)
Monocytes: 5 %
Neutrophils Absolute: 4.5 10*3/uL (ref 1.4–7.0)
Neutrophils: 66 %
Platelets: 237 10*3/uL (ref 150–450)
RBC: 4.24 x10E6/uL (ref 3.77–5.28)
RDW: 13.2 % (ref 11.7–15.4)
WBC: 6.7 10*3/uL (ref 3.4–10.8)

## 2023-03-28 LAB — COMPREHENSIVE METABOLIC PANEL
ALT: 12 IU/L (ref 0–32)
AST: 14 IU/L (ref 0–40)
Albumin/Globulin Ratio: 1.5 (ref 1.2–2.2)
Albumin: 4.3 g/dL (ref 3.9–4.9)
Alkaline Phosphatase: 51 IU/L (ref 44–121)
BUN/Creatinine Ratio: 14 (ref 9–23)
BUN: 10 mg/dL (ref 6–24)
Bilirubin Total: 0.4 mg/dL (ref 0.0–1.2)
CO2: 25 mmol/L (ref 20–29)
Calcium: 9.1 mg/dL (ref 8.7–10.2)
Chloride: 104 mmol/L (ref 96–106)
Creatinine, Ser: 0.69 mg/dL (ref 0.57–1.00)
Globulin, Total: 2.8 g/dL (ref 1.5–4.5)
Glucose: 89 mg/dL (ref 70–99)
Potassium: 4.3 mmol/L (ref 3.5–5.2)
Sodium: 140 mmol/L (ref 134–144)
Total Protein: 7.1 g/dL (ref 6.0–8.5)
eGFR: 111 mL/min/{1.73_m2} (ref >59)

## 2023-03-28 LAB — LIPID PANEL
Chol/HDL Ratio: 3 ratio (ref 0.0–4.4)
Cholesterol, Total: 220 mg/dL — ABNORMAL HIGH (ref 100–199)
HDL: 74 mg/dL (ref >39)
LDL Chol Calc (NIH): 124 mg/dL — ABNORMAL HIGH (ref 0–99)
Triglycerides: 127 mg/dL (ref 0–149)
VLDL Cholesterol Cal: 22 mg/dL (ref 5–40)

## 2023-03-28 LAB — VITAMIN D 25 HYDROXY (VIT D DEFICIENCY, FRACTURES): Vit D, 25-Hydroxy: 39.7 ng/mL (ref 30.0–100.0)

## 2023-03-28 LAB — HEMOGLOBIN A1C
Est. average glucose Bld gHb Est-mCnc: 108 mg/dL
Hgb A1c MFr Bld: 5.4 % (ref 4.8–5.6)

## 2023-03-28 LAB — SEDIMENTATION RATE: Sed Rate: 9 mm/hr (ref 0–32)

## 2023-04-08 ENCOUNTER — Telehealth: Payer: Self-pay | Admitting: Family Medicine

## 2023-04-08 NOTE — Telephone Encounter (Signed)
Pt requesting a "medical clearance" note as she has just joined [employer], She had discussed about weight and issue at CPE 6/6 with Dr Kathie Rhodes. [employer] is concerned about her   Disp Refills Start End   metFORMIN (GLUCOPHAGE-XR) 500 MG 24 hr table       With their program they would like her to take GLP-1S and wanted to have a note of clearance from PCP to either stop Metformin or a note stating that she could take both meds. Fu to advise [phone]

## 2023-04-15 ENCOUNTER — Ambulatory Visit
Admission: RE | Admit: 2023-04-15 | Discharge: 2023-04-15 | Disposition: A | Discharge status: Home / Self Care | Payer: Managed Care, Other (non HMO) | Source: Ambulatory Visit | Attending: Family Medicine | Admitting: Family Medicine

## 2023-04-15 ENCOUNTER — Other Ambulatory Visit: Payer: Self-pay | Admitting: Family Medicine

## 2023-04-15 DIAGNOSIS — Z1231 Encounter for screening mammogram for malignant neoplasm of breast: Secondary | ICD-10-CM

## 2023-04-15 DIAGNOSIS — F331 Major depressive disorder, recurrent, moderate: Secondary | ICD-10-CM

## 2023-04-15 MED ORDER — ESCITALOPRAM OXALATE 10 MG PO TABS: ORAL_TABLET | ORAL | 0 refills | Status: DC

## 2023-05-07 ENCOUNTER — Ambulatory Visit: Payer: Managed Care, Other (non HMO) | Admitting: Family Medicine

## 2023-06-15 ENCOUNTER — Other Ambulatory Visit: Payer: Self-pay | Admitting: Family Medicine

## 2023-06-15 DIAGNOSIS — E88819 Insulin resistance, unspecified: Secondary | ICD-10-CM

## 2023-06-15 DIAGNOSIS — G43009 Migraine without aura, not intractable, without status migrainosus: Secondary | ICD-10-CM

## 2023-06-16 ENCOUNTER — Other Ambulatory Visit: Payer: Self-pay

## 2023-06-16 DIAGNOSIS — E88819 Insulin resistance, unspecified: Secondary | ICD-10-CM

## 2023-06-16 DIAGNOSIS — G43009 Migraine without aura, not intractable, without status migrainosus: Secondary | ICD-10-CM

## 2023-06-19 ENCOUNTER — Telehealth: Payer: Self-pay | Admitting: Family Medicine

## 2023-06-19 NOTE — Telephone Encounter (Signed)
Copied from CRM [id number]. Topic: General - Inquiry >> Jun 19, 2023  1:13 PM De Blanch wrote: Reason for CRM:Pt asked if it was possible to make the Medical Clearance for Medication [employer] available for her to view via MyChart and send to the clinic if they haven't received it. Please advise.

## 2023-06-19 NOTE — Telephone Encounter (Signed)
Pt notified, forms faxed and scanned in mychart

## 2023-07-21 ENCOUNTER — Telehealth: Payer: Self-pay | Admitting: Family Medicine

## 2023-07-21 NOTE — Telephone Encounter (Signed)
Pt is calling to report that she will be going on a 2 week cruise leaving August 01, 2023 Requesting medication for sea sickness and nausea. Please advise  CB- [phone]

## 2023-07-28 ENCOUNTER — Telehealth (INDEPENDENT_AMBULATORY_CARE_PROVIDER_SITE_OTHER): Payer: Managed Care, Other (non HMO) | Admitting: Physician Assistant

## 2023-07-28 ENCOUNTER — Ambulatory Visit: Payer: Managed Care, Other (non HMO) | Admitting: Physician Assistant

## 2023-07-28 DIAGNOSIS — Z87898 Personal history of other specified conditions: Secondary | ICD-10-CM | POA: Insufficient documentation

## 2023-07-28 MED ORDER — SCOPOLAMINE 1 MG/3DAYS TD PT72SCOPOLAMINE 1 MG/3DAYS
1.0000 | MEDICATED_PATCH | TRANSDERMAL | 1 refills | Status: DC
Start: 2023-07-28 — End: 2024-03-29

## 2023-07-28 NOTE — Progress Notes (Signed)
     Virtual Visit via Video Note  I connected with Virginia Walls on 07/28/23 at  3:40 PM EDT by a video enabled telemedicine application and verified that I am speaking with the correct person using two identifiers.  Today's Provider: Jacquelin Hawking, MHS, PA-C Introduced myself to the patient as a PA-C and provided education on APPs in clinical practice.    Location: Patient: At home  Provider: [facility], [place], [place]   I discussed the limitations of evaluation and management by telemedicine and the availability of in person appointments. The patient expressed understanding and agreed to proceed.   Chief Complaint  Patient presents with   New Med Request    Pt requesting a med patch for coming up cruise trip    History of Present Illness:    History of motion sickness She reports she would like a medication to assist with an upcoming cruise She reports the last time she was on a cruise she had severe motion sickness that left her bedridden for most of the trip She states this upcoming trip is 2 weeks and would like to be able to enjoy it She tried Dramamine and the wrist bracelets last time but these did not help    Observations/Objective:   Due to the nature of the virtual visit, physical exam and observations are limited. Able to obtain the following observations:   Alert, oriented, x3  Appears comfortable, in no acute distress.  No scleral injection, no appreciated hoarseness, tachypnea, wheeze or strider. Able to maintain conversation without visible strain.  No cough appreciated during visit.    Assessment and Plan:  Problem List Items Addressed This Visit       Other   H/O motion sickness - Primary    Recurrent, episodic Patient reports previous experience while on a cruise during which she developed severe motion sickness and was confined to her bed.  She reports that she has upcoming 2-week long cruise and would like something to help  with motion sickness. Previously she tried Dramamine and motion sickness bracelets but these did not help at all Will provide scopolamine patches.  Discussed with patient that 1 patch should be good for 72 hours but if symptoms become more severe or not appropriately relieved with 1 patch she can apply a second Reviewed that she should alternate patch locations when replacing them every 3 days Follow-up as needed      Relevant Medications   scopolamine (TRANSDERM-SCOP) 1 MG/3DAYS   Follow Up Instructions:    I discussed the assessment and treatment plan with the patient. The patient was provided an opportunity to ask questions and all were answered. The patient agreed with the plan and demonstrated an understanding of the instructions.   The patient was advised to call back or seek an in-person evaluation if the symptoms worsen or if the condition fails to improve as anticipated.  I provided 5 minutes of non-face-to-face time during this encounter.  No follow-ups on file.   I, Ronnett Pullin E Garl Speigner, PA-C, have reviewed all documentation for this visit. The documentation on 07/28/23 for the exam, diagnosis, procedures, and orders are all accurate and complete.   Jacquelin Hawking, MHS, PA-C [facility]

## 2023-07-28 NOTE — Assessment & Plan Note (Signed)
Recurrent, episodic Patient reports previous experience while on a cruise during which she developed severe motion sickness and was confined to her bed.  She reports that she has upcoming 2-week long cruise and would like something to help with motion sickness. Previously she tried Dramamine and motion sickness bracelets but these did not help at all Will provide scopolamine patches.  Discussed with patient that 1 patch should be good for 72 hours but if symptoms become more severe or not appropriately relieved with 1 patch she can apply a second Reviewed that she should alternate patch locations when replacing them every 3 days Follow-up as needed

## 2023-08-26 ENCOUNTER — Other Ambulatory Visit: Payer: Self-pay | Admitting: Family Medicine

## 2023-08-26 DIAGNOSIS — E559 Vitamin D deficiency, unspecified: Secondary | ICD-10-CM

## 2023-08-26 NOTE — Telephone Encounter (Signed)
Pt is coming in this week to see Erin for also knee pain. I did inform her that you will be calling in her prescription. Please advise when and where you would like for me to put her on your schedule as your full until 2025.

## 2023-08-26 NOTE — Telephone Encounter (Signed)
Lvm for pt to schedule appt.

## 2023-08-28 ENCOUNTER — Ambulatory Visit: Payer: Managed Care, Other (non HMO) | Admitting: Physician Assistant

## 2023-08-28 ENCOUNTER — Ambulatory Visit
Admission: RE | Admit: 2023-08-28 | Discharge: 2023-08-28 | Disposition: A | Discharge status: Home / Self Care | Payer: Managed Care, Other (non HMO) | Source: Ambulatory Visit | Attending: Physician Assistant | Admitting: Physician Assistant

## 2023-08-28 ENCOUNTER — Ambulatory Visit
Admission: RE | Admit: 2023-08-28 | Discharge: 2023-08-28 | Disposition: A | Discharge status: Home / Self Care | Payer: Managed Care, Other (non HMO) | Attending: Physician Assistant | Admitting: Physician Assistant

## 2023-08-28 VITALS — BP 114/76 | HR 99 | Resp 16 | Ht 64.0 in | Wt 170.6 lb

## 2023-08-28 DIAGNOSIS — M25561 Pain in right knee: Secondary | ICD-10-CM

## 2023-08-28 MED ORDER — MELOXICAM 15 MG PO TABS
15.0000 mg | ORAL_TABLET | Freq: Every day | ORAL | 0 refills | Status: DC
Start: 2023-08-28 — End: 2023-09-29

## 2023-08-28 NOTE — Progress Notes (Signed)
Established Patient Office Visit  Name: Virginia Walls   MRN: [id number]    DOB: 03-Mar-1981   Date:08/28/2023  Today's Provider: Jacquelin Hawking, MHS, PA-C Introduced myself to the patient as a PA-C and provided education on APPs in clinical practice.         Subjective  Chief Complaint  Chief Complaint  Patient presents with   Knee Pain    On going for weeks, pt hit her R knee against something unable to remember    HPI  Right Knee pain   Onset: sudden  Duration: several weeks  Location: anterior aspect, this AM it started to hurt along medial aspect  Radiation: some radiation to medial aspect of upper calf Injury or trauma to the area: she remembers hitting it some time ago  Reports she is having a lot of pain in her right knee- especially during cruise while walking a lot  She reports she has to keep it extended due to pain while bent  Reports pain improved while she was on cruise after resting but came back   Pain level and character: currently 4-5/10 but bending it makes it worse- sharp in natur  Other associated symptoms:  She denies numbness or tingling in distal leg or knee, bruising or swelling  Interventions: nothing  Alleviating: nothing  Aggravating: bending and climbing/descending stairs    Patient Active Problem List   Diagnosis Date Noted   H/O motion sickness 07/28/2023   Closed fracture of base of fifth metatarsal bone of right foot 10/02/2022   Rheumatoid arthritis of right hand (HCC) 09/17/2022   Low serum vitamin B12 09/17/2022   Insulin resistance 09/17/2022   Intertrigo 04/09/2022   Bacterial vaginosis 04/09/2022   Effusion of left knee 04/09/2022   Migraine without aura and without status migrainosus, not intractable 04/09/2022   Inflammatory arthritis 02/25/2022   Noncompliance with medication regimen 01/16/2021   Moderate recurrent major depression (HCC) 04/11/2020   GAD (generalized anxiety disorder) 04/11/2020   Bereavement  04/11/2020   At risk for prolonged QT interval syndrome 04/11/2020   High risk medication use 04/11/2020   Chronic constipation 08/12/2017   Hyperglycemia 05/04/2015   Tattoos 05/04/2015   Headache, migraine 04/29/2015   Restless leg 04/29/2015   Snores 04/29/2015   Vitamin D deficiency 04/29/2015    Past Surgical History:  Procedure Laterality Date   ABDOMINAL HYSTERECTOMY     BREAST BIOPSY Left 03/21/2022   US biopsy/ ribbon clip/  BENIGN MAMMARY PARENCHYMA WITH DENSE STROMAL FIBROSIS AND PSEUDOANGIOMATOUS STROMAL HYPERPLASIA, FIBROCYSTIC AND APOCRINE CHANGES, COLUMNAR CELL CHANGE WITHOUT ATYPIA, AND PATCHY USUAL DUCTAL HYPERPLASIA -   ORIF TOE FRACTURE Right 10/10/2022   Procedure: OPEN REDUCTION INTERNAL FIXATION (ORIF) METATARSAL (TOE) FRACTURE - FIFTH;  Surgeon: Rosetta Posner, DPM;  Location: [facility];  Service: Podiatry;  Laterality: Right;   TUBAL LIGATION      Family History  Problem Relation Age of Onset   Heart disease Mother    Asthma Mother    Hypothyroidism Mother    Bipolar disorder Mother    Hypothyroidism Sister    Diabetes Sister    Hyperlipidemia Sister    Bipolar disorder Maternal Aunt    Bipolar disorder Maternal Uncle    Breast cancer Maternal Grandmother    Heart disease Paternal Grandmother        Pacemaker   Leukemia Paternal Grandmother    Diabetes Paternal Grandmother    Asthma Son  Social History   Tobacco Use   Smoking status: Former    Current packs/day: 0.00    Average packs/day: 0.3 packs/day for 11.4 years (2.9 ttl pk-yrs)    Types: Cigarettes    Start date: 05/04/2011    Quit date: 09/30/2022    Years since quitting: 0.9   Smokeless tobacco: Never  Substance Use Topics   Alcohol use: Not Currently    Comment: occasionally     Current Outpatient Medications:    Cyanocobalamin (B-12) 1000 MCG SUBL, Place 1 tablet under the tongue 3 (three) times a week., Disp: 100 tablet, Rfl: 1   escitalopram (LEXAPRO) 10 MG  tablet, TAKE 1 TABLET(10 MG) BY MOUTH DAILY, Disp: 30 tablet, Rfl: 0   folic acid (FOLVITE) 1 MG tablet, Take 1 mg by mouth daily., Disp: , Rfl:    hydroxychloroquine (PLAQUENIL) 200 MG tablet, Take 200 mg by mouth 2 (two) times daily., Disp: , Rfl:    meloxicam (MOBIC) 15 MG tablet, Take 1 tablet (15 mg total) by mouth daily., Disp: 30 tablet, Rfl: 0   methotrexate (RHEUMATREX) 2.5 MG tablet, Take 12.5 mg by mouth once a week. Caution:Chemotherapy. Protect from light., Disp: , Rfl:    Rimegepant Sulfate (NURTEC) 75 MG TBDP, Take 1 tablet by mouth daily as needed. Max of 2 in 24 hours, Disp: 16 tablet, Rfl: 2   topiramate (TOPAMAX) 50 MG tablet, TAKE 1 TABLET(50 MG) BY MOUTH TWICE DAILY, Disp: 180 tablet, Rfl: 1   Vitamin D, Ergocalciferol, (DRISDOL) 1.25 MG (50000 UNIT) CAPS capsule, TAKE 1 CAPSULE BY MOUTH EVERY 7 DAYS, Disp: 12 capsule, Rfl: 0   ZEPBOUND 2.5 MG/0.5ML Pen, Inject 2.5 mg into the skin once a week., Disp: , Rfl:    metFORMIN (GLUCOPHAGE-XR) 500 MG 24 hr tablet, TAKE 1 TABLET(500 MG) BY MOUTH DAILY WITH BREAKFAST, Disp: 90 tablet, Rfl: 1   scopolamine (TRANSDERM-SCOP) 1 MG/3DAYS, Place 1 patch (1.5 mg total) onto the skin every 3 (three) days. For motion sickness. Start 2 hr before onset symptoms, up to 12 hr before, Disp: 10 patch, Rfl: 1  Allergies  Allergen Reactions   Corylus Anaphylaxis    (Hazelnut)   Hydrocodone Anaphylaxis   Pumpkin Seed Anaphylaxis    Pumpkin    I personally reviewed active problem list, medication list, notes from last encounter, lab results with the patient/caregiver today.   Review of Systems  Musculoskeletal:  Positive for joint pain.       Right knee pain        Objective  Vitals:   08/28/23 1340  BP: 114/76  Pulse: 99  Resp: 16  SpO2: 99%  Weight: 170 lb 9.6 oz (77.4 kg)  Height: 5\' 4"  (1.626 m)    Body mass index is 29.28 kg/m.  Physical Exam Vitals reviewed.  Constitutional:      General: She is awake.      Appearance: Normal appearance. She is well-developed and well-groomed.  HENT:     Head: Normocephalic and atraumatic.  Pulmonary:     Effort: Pulmonary effort is normal.  Musculoskeletal:     Cervical back: Normal range of motion.     Right knee: No effusion, erythema or crepitus. Normal range of motion. No tenderness. No LCL laxity, MCL laxity, ACL laxity or PCL laxity. Normal alignment and normal meniscus.     Instability Tests: Anterior drawer test negative. Posterior drawer test negative. Medial McMurray test negative and lateral McMurray test negative.     Left knee: Normal.  Comments: Negative Apley grind testing bilaterally   Skin:    General: Skin is warm and dry.  Neurological:     General: No focal deficit present.     Mental Status: She is alert and oriented to person, place, and time.  Psychiatric:        Mood and Affect: Mood normal.        Behavior: Behavior normal. Behavior is cooperative.        Thought Content: Thought content normal.        Judgment: Judgment normal.      No results found for this or any previous visit (from the past 2160 hour(s)).   PHQ2/9:    08/28/2023    1:37 PM 07/28/2023    2:19 PM 03/27/2023    9:38 AM 10/02/2022    2:43 PM 09/17/2022    1:36 PM  Depression screen PHQ 2/9  Decreased Interest 0 0 0 1 1  Down, Depressed, Hopeless 0 0 0 1 1  PHQ - 2 Score 0 0 0 2 2  Altered sleeping 0 0 0 1 2  Tired, decreased energy 0 0 0 1 1  Change in appetite 0 0 0 1 0  Feeling bad or failure about yourself  0 0 0 1 1  Trouble concentrating 0 0 0 1 1  Moving slowly or fidgety/restless 0 0 0 0 0  Suicidal thoughts 0 0 0 0 0  PHQ-9 Score 0 0 0 7 7  Difficult doing work/chores Not difficult at all Not difficult at all Not difficult at all Not difficult at all       Fall Risk:    08/28/2023    1:37 PM 07/28/2023    2:18 PM 03/27/2023    9:38 AM 10/02/2022    2:38 PM 09/17/2022    1:36 PM  Fall Risk   Falls in the past year? 0 0 0 0 0   Number falls in past yr: 0 0 0  0  Injury with Fall? 0 0   0  Risk for fall due to : No Fall Risks No Fall Risks  Impaired mobility;Impaired balance/gait;Orthopedic patient No Fall Risks  Follow up Falls prevention discussed;Education provided;Falls evaluation completed Falls prevention discussed;Education provided;Falls evaluation completed  Falls prevention discussed Falls prevention discussed      Functional Status Survey: Is the patient deaf or have difficulty hearing?: No Does the patient have difficulty seeing, even when wearing glasses/contacts?: No Does the patient have difficulty concentrating, remembering, or making decisions?: No Does the patient have difficulty walking or climbing stairs?: Yes Does the patient have difficulty dressing or bathing?: No Does the patient have difficulty doing errands alone such as visiting a doctor's office or shopping?: No    Assessment & Plan  Problem List Items Addressed This Visit   None Visit Diagnoses     Acute pain of right knee    -  Primary   Relevant Medications   meloxicam (MOBIC) 15 MG tablet   Other Relevant Orders   DG Knee Complete 4 Views Right      Acute, new concern Patient reports ongoing anterior right knee pain for the past few weeks that is not improving Will get x-ray for evaluation Suspect overuse injury based on HPI Results to dictate further management Physical exam was overall normal so I am less suspicious of ligamentous or meniscal injury, effusion at this time Will start meloxicam 15 mg p.o. daily and recommend Tylenol as needed for further management  of pain If symptoms persist may need to try steroid injection Follow-up as needed for progressing or persistent symptoms   No follow-ups on file.   I, Laurina Fischl E Cadel Stairs, PA-C, have reviewed all documentation for this visit. The documentation on 08/28/23 for the exam, diagnosis, procedures, and orders are all accurate and complete.   Jacquelin Hawking, MHS,  PA-C [facility]

## 2023-08-28 NOTE — Patient Instructions (Signed)
  I recommend the following at this time to help relieve that discomfort:  Rest Warm compresses to the area (20 minutes on, minimum of 30 minutes off) You can alternate Tylenol and Ibuprofen for pain management but Ibuprofen is typically preferred to reduce inflammation.  I have sent in a script for Meloxicam 15 mg to be taken by mouth once per day as needed. Please take this with plenty of water and with food to prevent upset stomach Gentle stretches and exercises that I have included in your paperwork Try to reduce excess strain to the area and rest as much as possible  Wear supportive shoes and, if you must lift anything, use proper lifting techniques that spare your back.   If these measures do not lead to improvement in your symptoms over the next 2-4 weeks please let us know

## 2023-09-16 ENCOUNTER — Other Ambulatory Visit: Payer: Self-pay | Admitting: Family Medicine

## 2023-09-16 DIAGNOSIS — F331 Major depressive disorder, recurrent, moderate: Secondary | ICD-10-CM

## 2023-09-16 NOTE — Telephone Encounter (Signed)
Patient has been seen twice for acute visits only I have not seen her since June and last fill of lexapro was in June She must be seen, refill not being sent to pharmacy

## 2023-09-17 NOTE — Progress Notes (Signed)
Your imaging shows mild arthritis along your kneecap.  No evidence of extra fluid, dislocation or fracture

## 2023-09-17 NOTE — Telephone Encounter (Signed)
Patient is coming in on Wed Dec.4, 2024 to see erin for followup. Are you sending in the refill?

## 2023-09-24 ENCOUNTER — Telehealth (INDEPENDENT_AMBULATORY_CARE_PROVIDER_SITE_OTHER): Payer: Managed Care, Other (non HMO) | Admitting: Physician Assistant

## 2023-09-24 DIAGNOSIS — F331 Major depressive disorder, recurrent, moderate: Secondary | ICD-10-CM | POA: Diagnosis not present

## 2023-09-24 MED ORDER — ESCITALOPRAM OXALATE 10 MG PO TABS: 10.0000 mg | ORAL_TABLET | Freq: Every day | ORAL | 1 refills | Status: DC

## 2023-09-24 NOTE — Assessment & Plan Note (Addendum)
Chronic, ongoing  Currently exacerbated due to lack of medication Reviewed PHQ2 and GAD7 results- PHQ2 was 0 but suspect PHQ9 would likely be positive given her symptom descriptions GAD7 was high with score of 17  Reviewed previous treatment regimen of Lexapro which she would like to restart today to assist with symptoms  Script sent in for lexapro 10 mg PO every day - reviewed most common side effects and return precautions.  Recommend follow up in 6 weeks or sooner to assess response

## 2023-09-24 NOTE — Progress Notes (Signed)
Virtual Visit via Video Note  I connected with Virginia Walls on 09/24/23 at  8:20 AM EST by a video enabled telemedicine application and verified that I am speaking with the correct person using two identifiers.  Today's Provider: Jacquelin Hawking, MHS, PA-C Introduced myself to the patient as a PA-C and provided education on APPs in clinical practice.   Location: Patient: At home  Provider: [facility], [place], [place]   I discussed the limitations of evaluation and management by telemedicine and the availability of in person appointments. The patient expressed understanding and agreed to proceed.   Chief Complaint  Patient presents with   Medication Refill    History of Present Illness:    Discussed the use of AI scribe software for clinical note transcription with the patient, who gave verbal consent to proceed.  History of Present Illness   The patient, with a history of depression, presents after a couple of months without a visit. They report an increase in emotional reactivity and disengagement, which they attribute to their depression. They also express concerns about anxiety, indicating a possible exacerbation of their mental health symptoms. The patient acknowledges that they had stopped their previous treatment regimen, which may have contributed to their current state.      She would like to get refills of her lexapro today to assist with symptoms   She denies thoughts of SI She reports difficulty falling asleep and staying asleep - thinks she is getting 4 hours or less per night on avg Reports symptoms have really been bad the last 2-3 weeks with the holidays coming up  She reports her symptoms have been impacting her work and home life- her significant other has noted significant changes to emotional state and she reports ongoing concerns for job performance and fatigue  She denies previous side effects with lexapro use - reviewed most common side  effects with her and return precautions      09/24/2023    8:00 AM 08/28/2023    1:37 PM 07/28/2023    2:19 PM 03/27/2023    9:38 AM 10/02/2022    2:43 PM  Depression screen PHQ 2/9  Decreased Interest 0 0 0 0 1  Down, Depressed, Hopeless 0 0 0 0 1  PHQ - 2 Score 0 0 0 0 2  Altered sleeping  0 0 0 1  Tired, decreased energy  0 0 0 1  Change in appetite  0 0 0 1  Feeling bad or failure about yourself   0 0 0 1  Trouble concentrating  0 0 0 1  Moving slowly or fidgety/restless  0 0 0 0  Suicidal thoughts  0 0 0 0  PHQ-9 Score  0 0 0 7  Difficult doing work/chores  Not difficult at all Not difficult at all Not difficult at all Not difficult at all        09/24/2023    8:30 AM 10/02/2022    2:43 PM 08/21/2020    3:10 PM 08/03/2020   10:30 AM  GAD 7 : Generalized Anxiety Score  Nervous, Anxious, on Edge 3 1 2 3   Control/stop worrying 2 1 3 3   Worry too much - different things 2 1 3 3   Trouble relaxing 3 0 2 3  Restless 3 0 2 3  Easily annoyed or irritable 3 1 2 3   Afraid - awful might happen 1 1 2 3   Total GAD 7 Score 17 5 16  21  Anxiety Difficulty Extremely difficult Not difficult at all Somewhat difficult Extremely difficult       Observations/Objective:  Due to the nature of the virtual visit, physical exam and observations are limited. Able to obtain the following observations:   Alert, oriented, x3 Appears comfortable, in no acute distress.  No scleral injection, no appreciated hoarseness, tachypnea, wheeze or strider. Able to maintain conversation without visible strain.  No cough appreciated during visit.    Assessment and Plan:  Problem List Items Addressed This Visit       Other   Moderate recurrent major depression (HCC)    Chronic, ongoing  Currently exacerbated due to lack of medication Reviewed PHQ2 and GAD7 results- PHQ2 was 0 but suspect PHQ9 would likely be positive given her symptom descriptions GAD7 was high with score of 17  Reviewed previous  treatment regimen of Lexapro which she would like to restart today to assist with symptoms  Script sent in for lexapro 10 mg PO every day - reviewed most common side effects and return precautions.  Recommend follow up in 6 weeks or sooner to assess response        Relevant Medications   escitalopram (LEXAPRO) 10 MG tablet    Follow Up Instructions:    I discussed the assessment and treatment plan with the patient. The patient was provided an opportunity to ask questions and all were answered. The patient agreed with the plan and demonstrated an understanding of the instructions.   The patient was advised to call back or seek an in-person evaluation if the symptoms worsen or if the condition fails to improve as anticipated.  I provided 15 minutes of non-face-to-face time during this encounter.  No follow-ups on file.   I, Bernestine Holsapple E Rony Ratz, PA-C, have reviewed all documentation for this visit. The documentation on 09/24/23 for the exam, diagnosis, procedures, and orders are all accurate and complete.   Jacquelin Hawking, MHS, PA-C [facility]

## 2023-09-25 ENCOUNTER — Telehealth: Payer: Self-pay | Admitting: Family Medicine

## 2023-09-25 NOTE — Telephone Encounter (Signed)
Called patient and advised GoodRx card per [facility] Mecum's request.

## 2023-09-25 NOTE — Telephone Encounter (Signed)
Medication Refill -  Most Recent Primary Care Visit:  Provider: Jacquelin Hawking E  Department: [facility]  Visit Type: MYCHART VIDEO VISIT  Date: 09/24/2023  Medication: escitalopram (LEXAPRO) 10 MG tablet Pt informed by pharmacy, prescription needs to be 90 day supply instead of 60 day so insurance will cover it. Pt requesting 90 day supply.  Has the patient contacted their pharmacy? Yes (Agent: If yes, when and what did the pharmacy advise?) Contact office.   Is this the correct pharmacy for this prescription? Yes This is the patient's preferred pharmacy:  [id number] - [place], [place] - [address] AT [facility] [address] [facility] [address] [place] [address] Phone: [phone] Fax: [phone]  Has the prescription been filled recently? Yes  Is the patient out of the medication? Yes  Has the patient been seen for an appointment in the last year OR does the patient have an upcoming appointment? Yes  Can we respond through MyChart? Yes  Agent: Please be advised that Rx refills may take up to 3 business days. We ask that you follow-up with your pharmacy.

## 2023-09-26 ENCOUNTER — Other Ambulatory Visit: Payer: Self-pay | Admitting: Physician Assistant

## 2023-09-26 ENCOUNTER — Telehealth: Payer: Self-pay | Admitting: Family Medicine

## 2023-09-26 DIAGNOSIS — M25561 Pain in right knee: Secondary | ICD-10-CM

## 2023-09-26 NOTE — Telephone Encounter (Signed)
Pt notified no actice RX for [place] to do prior auth she is getting through [employer] and will nee to contact them

## 2023-09-26 NOTE — Telephone Encounter (Signed)
Pt states she needs a prior auth on her ZEPBOUND 2.5 MG/0.5ML Pen . Pt states she needs this done today, or she will have to start all over.  Please call: [phone]

## 2023-09-29 NOTE — Telephone Encounter (Signed)
Requested Prescriptions  Pending Prescriptions Disp Refills   meloxicam (MOBIC) 15 MG tablet [Pharmacy Med Name: MELOXICAM 15MG  TABLETS] 30 tablet 0    Sig: TAKE 1 TABLET(15 MG) BY MOUTH DAILY     Analgesics:  COX2 Inhibitors Failed - 09/26/2023  3:30 AM      Failed - Manual Review: Labs are only required if the patient has taken medication for more than 8 weeks.      Passed - HGB in normal range and within 360 days    Hemoglobin  Date Value Ref Range Status  03/27/2023 13.4 11.1 - 15.9 g/dL Final         Passed - Cr in normal range and within 360 days    Creat  Date Value Ref Range Status  07/24/2017 0.74 0.50 - 1.10 mg/dL Final   Creatinine, Ser  Date Value Ref Range Status  03/27/2023 0.69 0.57 - 1.00 mg/dL Final         Passed - HCT in normal range and within 360 days    Hematocrit  Date Value Ref Range Status  03/27/2023 40.8 34.0 - 46.6 % Final         Passed - AST in normal range and within 360 days    AST  Date Value Ref Range Status  03/27/2023 14 0 - 40 IU/L Final         Passed - ALT in normal range and within 360 days    ALT  Date Value Ref Range Status  03/27/2023 12 0 - 32 IU/L Final         Passed - eGFR is 30 or above and within 360 days    GFR, Est African American  Date Value Ref Range Status  07/24/2017 121 > OR = 60 mL/min/1.51m2 Final   GFR, Est Non African American  Date Value Ref Range Status  07/24/2017 104 > OR = 60 mL/min/1.7m2 Final   eGFR  Date Value Ref Range Status  03/27/2023 111 >59 mL/min/1.73 Final         Passed - Patient is not pregnant      Passed - Valid encounter within last 12 months    Recent Outpatient Visits           5 days ago Moderate recurrent major depression (HCC)   [facility] Mecum, Erin E, PA-C   1 month ago Acute pain of right knee   [facility] Mecum, Oswaldo Conroy, PA-C   2 months ago H/O motion sickness   [facility]  Mecum, Oswaldo Conroy, PA-C   6 months ago Well adult exam   [facility] Alba Cory, MD   12 months ago Closed fracture of base of fifth metatarsal bone of right foot   [facility] Alba Cory, MD       Future Appointments             In 6 months Carlynn Purl, Danna Hefty, MD [facility]

## 2023-11-19 ENCOUNTER — Other Ambulatory Visit: Payer: Self-pay | Admitting: Family Medicine

## 2023-11-19 DIAGNOSIS — E559 Vitamin D deficiency, unspecified: Secondary | ICD-10-CM

## 2023-11-25 ENCOUNTER — Other Ambulatory Visit: Payer: Self-pay | Admitting: Physician Assistant

## 2023-11-25 DIAGNOSIS — F331 Major depressive disorder, recurrent, moderate: Secondary | ICD-10-CM

## 2023-11-27 NOTE — Telephone Encounter (Signed)
 Requested Prescriptions  Pending Prescriptions Disp Refills   escitalopram  (LEXAPRO ) 10 MG tablet [Pharmacy Med Name: ESCITALOPRAM  10MG  TABLETS] 30 tablet 1    Sig: TAKE 1 TABLET(10 MG) BY MOUTH DAILY     Psychiatry:  Antidepressants - SSRI Passed - 11/27/2023  9:02 AM      Passed - Completed PHQ-2 or PHQ-9 in the last 360 days      Passed - Valid encounter within last 6 months    Recent Outpatient Visits           2 months ago Moderate recurrent major depression (HCC)   [facility] Mecum, Erin E, PA-C   3 months ago Acute pain of right knee   [facility] Mecum, Rocky BRAVO, PA-C   4 months ago H/O motion sickness   [facility] Mecum, Rocky BRAVO, PA-C   8 months ago Well adult exam   [facility] Glenard Mire, MD   1 year ago Closed fracture of base of fifth metatarsal bone of right foot   [facility] Glenard Mire, MD       Future Appointments             In 4 months Glenard, Krichna, MD [facility]

## 2023-12-01 ENCOUNTER — Other Ambulatory Visit: Payer: Self-pay | Admitting: Family Medicine

## 2023-12-01 DIAGNOSIS — E559 Vitamin D deficiency, unspecified: Secondary | ICD-10-CM

## 2023-12-02 NOTE — Telephone Encounter (Signed)
Requested medication (s) are due for refill today: no  Requested medication (s) are on the active medication list: yes  Last refill:  11/19/23 12 caps  Future visit scheduled: yes  Notes to clinic:  routing to provider to review due to this strength   Requested Prescriptions  Pending Prescriptions Disp Refills   Vitamin D, Ergocalciferol, (DRISDOL) 1.25 MG (50000 UNIT) CAPS capsule [Pharmacy Med Name: VITAMIN D2 50,000IU (ERGO) CAP RX] 12 capsule 0    Sig: TAKE 1 CAPSULE BY MOUTH EVERY 7 DAYS     Endocrinology:  Vitamins - Vitamin D Supplementation 2 Failed - 12/02/2023  2:47 PM      Failed - Manual Review: Route requests for 50,000 IU strength to the provider      Passed - Ca in normal range and within 360 days    Calcium  Date Value Ref Range Status  03/27/2023 9.1 8.7 - 10.2 mg/dL Final   Calcium, Total  Date Value Ref Range Status  08/20/2013 9.1 8.5 - 10.1 mg/dL Final         Passed - Vitamin D in normal range and within 360 days    Vit D, 25-Hydroxy  Date Value Ref Range Status  03/27/2023 39.7 30.0 - 100.0 ng/mL Final    Comment:    Vitamin D deficiency has been defined by the Institute of Medicine and an Endocrine Society practice guideline as a level of serum 25-OH vitamin D less than 20 ng/mL (1,2). The Endocrine Society went on to further define vitamin D insufficiency as a level between 21 and 29 ng/mL (2). 1. IOM (Institute of Medicine). 2010. Dietary reference    intakes for calcium and D. [place]: The    [employer]. 2. Holick MF, Binkley [place], Bischoff-Ferrari HA, et al.    Evaluation, treatment, and prevention of vitamin D    deficiency: an Endocrine Society clinical practice    guideline. JCEM. 2011 Jul; 96(7):1911-30.          Passed - Valid encounter within last 12 months    Recent Outpatient Visits           2 months ago Moderate recurrent major depression (HCC)   [facility] Mecum, Erin E, PA-C   3  months ago Acute pain of right knee   [facility] Mecum, Oswaldo Conroy, PA-C   4 months ago H/O motion sickness   [facility] Mecum, Oswaldo Conroy, PA-C   8 months ago Well adult exam   [facility] Alba Cory, MD   1 year ago Closed fracture of base of fifth metatarsal bone of right foot   [facility] Alba Cory, MD       Future Appointments             In 3 months Carlynn Purl, Danna Hefty, MD [facility]

## 2024-02-03 ENCOUNTER — Other Ambulatory Visit: Payer: Self-pay | Admitting: Family Medicine

## 2024-02-03 DIAGNOSIS — F331 Major depressive disorder, recurrent, moderate: Secondary | ICD-10-CM

## 2024-02-16 ENCOUNTER — Telehealth: Payer: Self-pay | Admitting: Family Medicine

## 2024-02-16 NOTE — Telephone Encounter (Signed)
 PA done waiting on insurance to determine

## 2024-02-16 NOTE — Telephone Encounter (Signed)
 Rimegepant Sulfate (NURTEC) 75 MG TBDP   Key: [id number]

## 2024-02-21 ENCOUNTER — Other Ambulatory Visit: Payer: Self-pay | Admitting: Family Medicine

## 2024-02-21 DIAGNOSIS — E559 Vitamin D deficiency, unspecified: Secondary | ICD-10-CM

## 2024-03-16 IMAGING — MG MM DIGITAL SCREENING BILAT W/ TOMO AND CAD
8 series · 8 of 24 positions shown · non-contrast
Comparison: None.

CLINICAL DATA: Screening. Baseline exam.

EXAM:
DIGITAL SCREENING BILATERAL MAMMOGRAM WITH TOMOSYNTHESIS AND CAD
TECHNIQUE: Bilateral screening digital craniocaudal and mediolateral oblique
mammograms were obtained. Bilateral screening digital breast
tomosynthesis was performed. The images were evaluated with
computer-aided detection.

[L CC synth-2D]
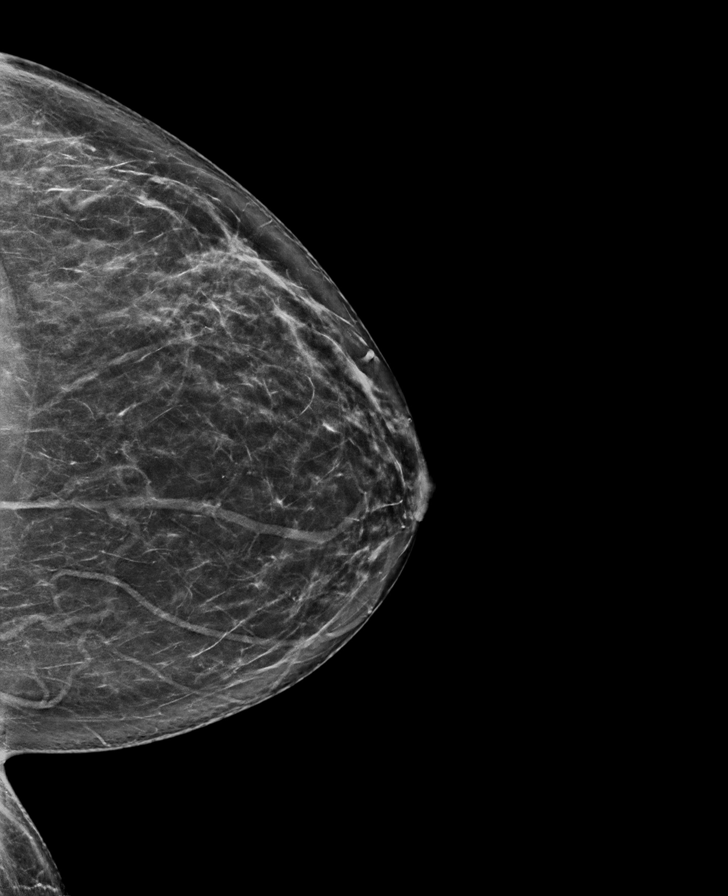

[R CC synth-2D]
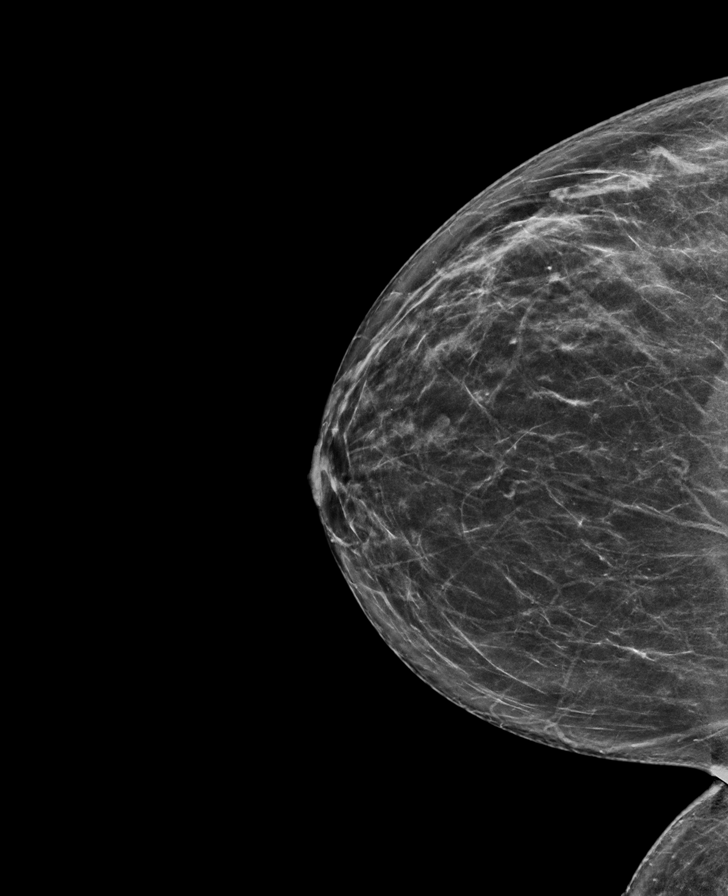

[L MLO synth-2D]
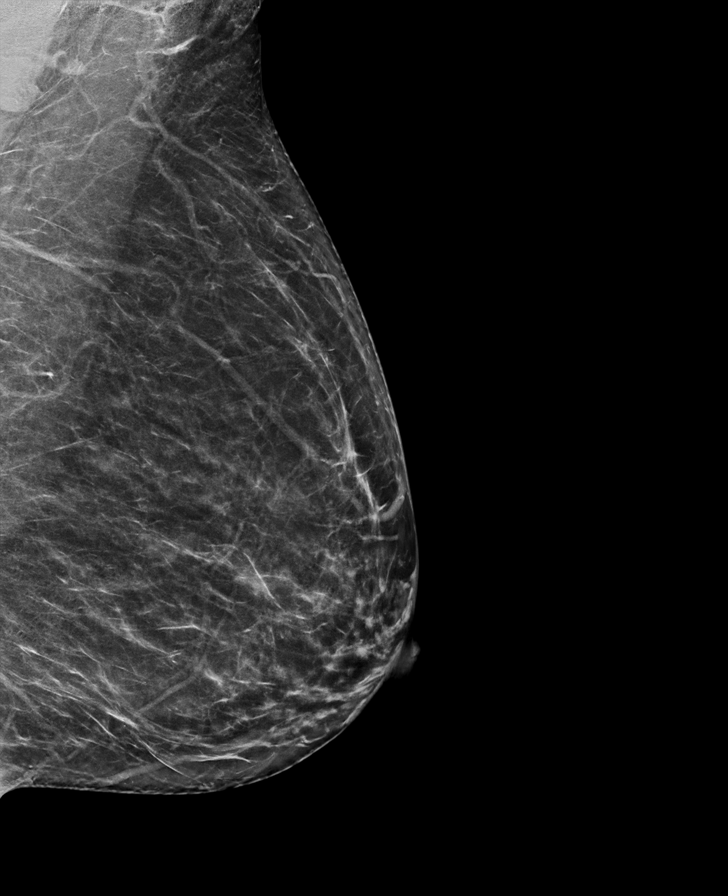

[R MLO synth-2D]
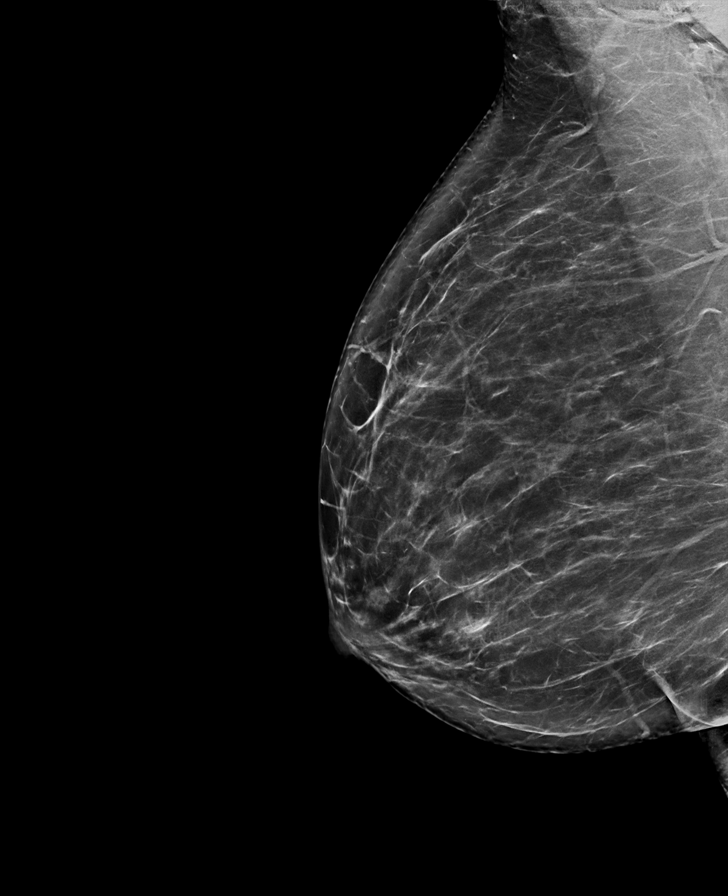

[L CC tomo · tomo slice 38/75.0]
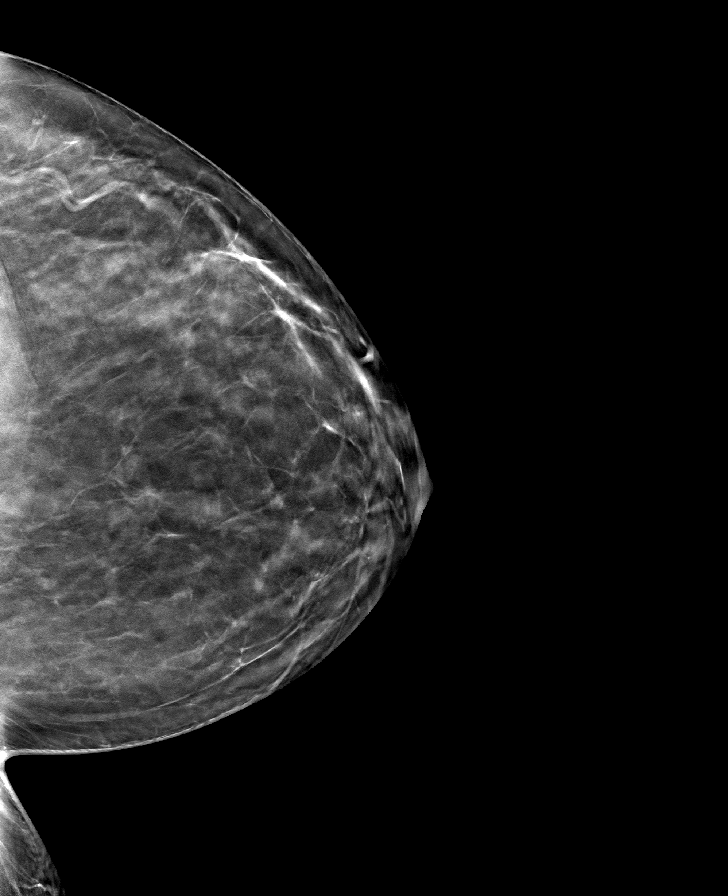

[R MLO tomo · tomo slice 45/90.0]
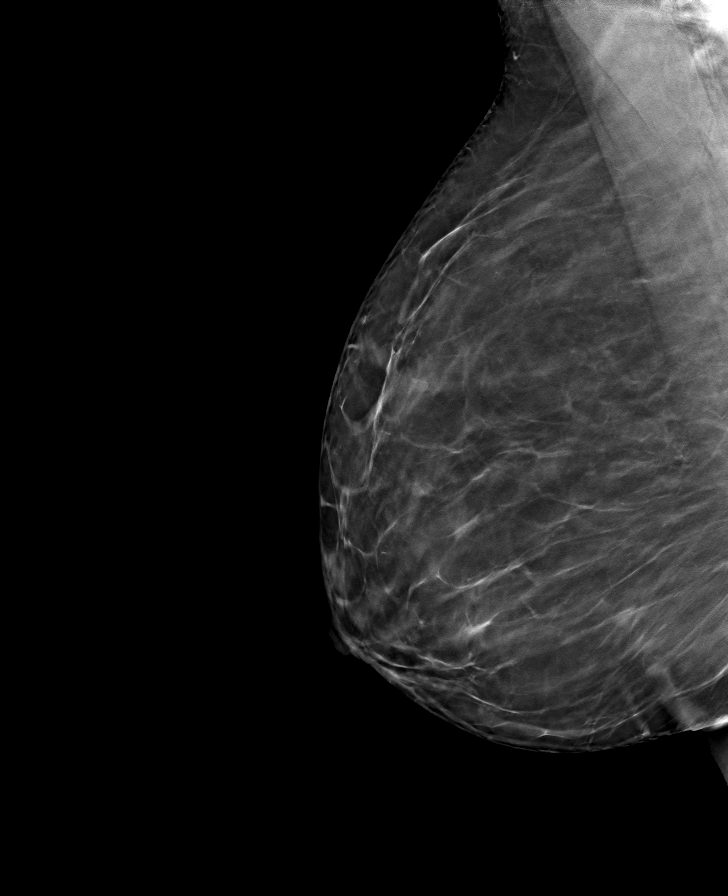

[R CC tomo · tomo slice 35/70.0]
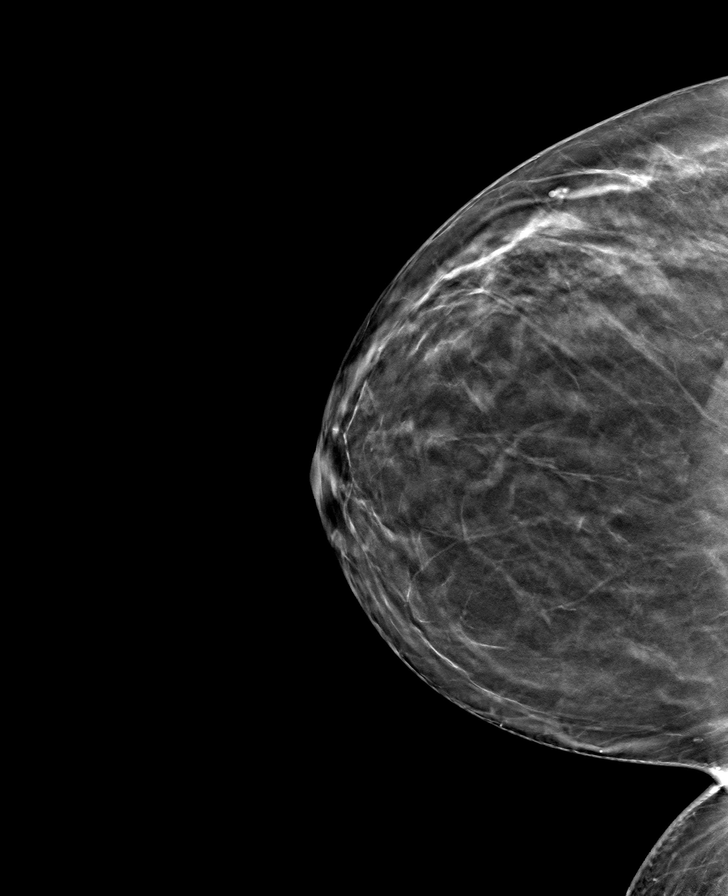

[L MLO tomo · tomo slice 38/75.0]
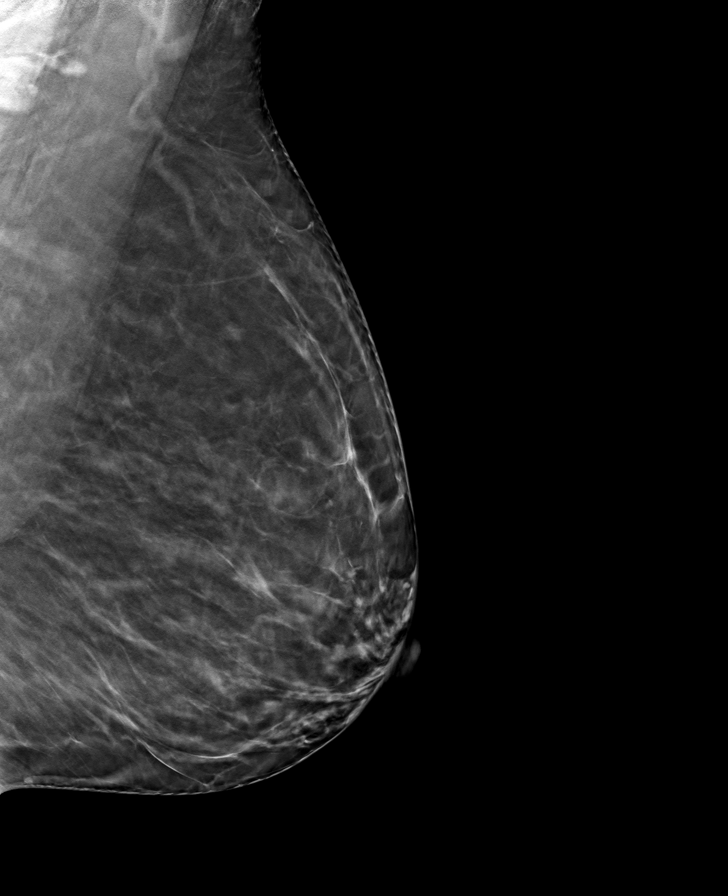

[8 of 24 positions shown; findings below may reference images not displayed]

ACR Breast Density Category b: There are scattered areas of
fibroglandular density.
FINDINGS: In the right breast a possible mass requires further evaluation.

In the left breast a possible focal asymmetry requires further
evaluation.
IMPRESSION: Further evaluation is suggested for possible mass in the right
breast.

Further evaluation is suggested for possible focal asymmetry in the
left breast.

RECOMMENDATION:
Diagnostic mammogram and possibly ultrasound of both breasts.
(Code:[id number])

The patient will be contacted regarding the findings, and additional
imaging will be scheduled.

BI-RADS CATEGORY  0: Incomplete. Need additional imaging evaluation
and/or prior mammograms for comparison.

## 2024-03-24 ENCOUNTER — Other Ambulatory Visit: Payer: Self-pay | Admitting: Family Medicine

## 2024-03-24 DIAGNOSIS — Z1231 Encounter for screening mammogram for malignant neoplasm of breast: Secondary | ICD-10-CM

## 2024-03-29 ENCOUNTER — Encounter: Payer: Self-pay | Admitting: Family Medicine

## 2024-03-29 ENCOUNTER — Telehealth: Payer: Self-pay

## 2024-03-29 ENCOUNTER — Ambulatory Visit: Payer: Self-pay | Admitting: Family Medicine

## 2024-03-29 ENCOUNTER — Other Ambulatory Visit (HOSPITAL_COMMUNITY): Payer: Self-pay

## 2024-03-29 VITALS — BP 112/74 | HR 89 | Resp 16 | Ht 63.0 in | Wt 152.3 lb

## 2024-03-29 DIAGNOSIS — Z0001 Encounter for general adult medical examination with abnormal findings: Secondary | ICD-10-CM | POA: Diagnosis not present

## 2024-03-29 DIAGNOSIS — F331 Major depressive disorder, recurrent, moderate: Secondary | ICD-10-CM

## 2024-03-29 DIAGNOSIS — E538 Deficiency of other specified B group vitamins: Secondary | ICD-10-CM | POA: Diagnosis not present

## 2024-03-29 DIAGNOSIS — M069 Rheumatoid arthritis, unspecified: Secondary | ICD-10-CM

## 2024-03-29 DIAGNOSIS — G43009 Migraine without aura, not intractable, without status migrainosus: Secondary | ICD-10-CM

## 2024-03-29 DIAGNOSIS — Z0000 Encounter for general adult medical examination without abnormal findings: Secondary | ICD-10-CM

## 2024-03-29 DIAGNOSIS — Z131 Encounter for screening for diabetes mellitus: Secondary | ICD-10-CM

## 2024-03-29 DIAGNOSIS — E559 Vitamin D deficiency, unspecified: Secondary | ICD-10-CM

## 2024-03-29 DIAGNOSIS — E785 Hyperlipidemia, unspecified: Secondary | ICD-10-CM

## 2024-03-29 DIAGNOSIS — Z79899 Other long term (current) drug therapy: Secondary | ICD-10-CM

## 2024-03-29 MED ORDER — NURTEC 75 MG PO TBDP: 1.0000 | ORAL_TABLET | ORAL | 5 refills | Status: DC

## 2024-03-29 MED ORDER — ESCITALOPRAM OXALATE 10 MG PO TABS: 10.0000 mg | ORAL_TABLET | Freq: Every day | ORAL | 1 refills | Status: DC

## 2024-03-29 NOTE — Telephone Encounter (Signed)
 Pharmacy Patient Advocate Encounter   Received notification from Onbase that prior authorization for Nurtec 75MG  dispersible tablets  is required/requested.   Insurance verification completed.   The patient is insured through [facility] .   Per test claim: PA required; PA submitted to above mentioned insurance via CoverMyMeds Key/confirmation #/EOC [facility] Status is pending

## 2024-03-29 NOTE — Progress Notes (Signed)
 Name: Virginia Walls   MRN: [id number]    DOB: 1980-10-30   Date:03/29/2024       Progress Note  Subjective  Chief Complaint  Chief Complaint  Patient presents with   Annual Exam    HPI  Patient presents for annual CPE and follow up.  Discussed the use of AI scribe software for clinical note transcription with the patient, who gave verbal consent to proceed.  History of Present Illness Virginia Walls is a 43 year old female with rheumatoid arthritis who presents for a physical and follow-up visit.  Rheumatoid arthritis is currently well-managed with hydroxychloroquine 200 mg twice daily and methotrexate once a week, along with folic acid. She experiences knee pain but notes no swelling in her fingers. History of multiple joint involvement, including hands and knees  Not on cholesterol medication. LDL decreased from 150 mg/dL in 3244 to 010 mg/dL due to lifestyle modifications. Participates in a weight loss program through [employer] and [employer], logging food intake and exercising regularly. Exercises five days a week, twice a day, using a max trainer at home. Weight decreased from 182 pounds to 152 pounds in one year , with BMI reduction from 31.24 to 26.98.  Major depression recurrent mild , previously on Lexapro , but has been out of it since March. PHQ-9 score is 7. Increased emotional distress related to the memory of her son, Virginia Walls, who passed away at age 31. Finds comfort in memories shared by his friends. She has also been under more stress at work due to change in management   History of obesity, currently on Zepbound 5 mg for weight loss, significantly reducing appetite. Logs food intake and follows a point system. Takes prescription vitamin D  due to a history of low levels.  Experiences migraines three times a week, with pain from the back to the front of her head, neck pain, and photophobia. Previously on Topamax , but it was ineffective. Experiences nausea and visual  disturbances during migraines.  No cough, shortness of breath, nausea, vomiting, or changes in appetite. Reports increased migraines, emotional distress related to son's passing, and decreased appetite due to Zepbound.     Diet: taking Zepbound through weight loss program at work , healthy diet  Exercise: working out 5 days a week  Last Eye Exam: needs to scheduled  Last Dental Exam: needs to schedule it   [employer] Visit from 03/29/2024 in [facility]  AUDIT-C Score 2       Depression: Phq 9 is  positive    03/29/2024    8:49 AM 09/24/2023    8:00 AM 08/28/2023    1:37 PM 07/28/2023    2:19 PM 03/27/2023    9:38 AM  Depression screen PHQ 2/9  Decreased Interest 1 0 0 0 0  Down, Depressed, Hopeless 1 0 0 0 0  PHQ - 2 Score 2 0 0 0 0  Altered sleeping 1  0 0 0  Tired, decreased energy 1  0 0 0  Change in appetite 1  0 0 0  Feeling bad or failure about yourself  1  0 0 0  Trouble concentrating 1  0 0 0  Moving slowly or fidgety/restless 0  0 0 0  Suicidal thoughts 0  0 0 0  PHQ-9 Score 7  0 0 0  Difficult doing work/chores Very difficult  Not difficult at all Not difficult at all Not difficult at all   Hypertension: BP Readings from  Last 3 Encounters:  03/29/24 112/74  08/28/23 114/76  03/27/23 118/74   Obesity: Wt Readings from Last 3 Encounters:  03/29/24 152 lb 4.8 oz (69.1 kg)  08/28/23 170 lb 9.6 oz (77.4 kg)  03/27/23 182 lb (82.6 kg)   BMI Readings from Last 3 Encounters:  03/29/24 26.98 kg/m  08/28/23 29.28 kg/m  03/27/23 31.24 kg/m     Vaccines: reviewed with the patient.   Hep C Screening: completed STD testing and prevention (HIV/chl/gon/syphilis): not interested  Intimate partner violence: negative screen  Sexual History : no pain during intercourse, same partner since HS Menstrual History/LMP/Abnormal Bleeding: hysterectomy in her 20's  Discussed importance of follow up if any post-menopausal bleeding: not  applicable  Incontinence Symptoms: negative for symptoms   Breast cancer:  - Last Mammogram: scheduled mammogram for June - BRCA gene screening: maternal grandmother had breast cancer   Osteoporosis Prevention : Discussed high calcium and vitamin D  supplementation, weight bearing exercises Bone density :not applicable   Cervical cancer screening: not applicable due to hysterectomy  Skin cancer: Discussed monitoring for atypical lesions  Colorectal cancer: N/A Lung cancer:  Low Dose CT Chest recommended if Age 13-80 years, 20 pack-year currently smoking OR have quit w/in 15years. Patient does not qualify for screen   ECG: 2021  Advanced Care Planning: A voluntary discussion about advance care planning including the explanation and discussion of advance directives.  Discussed health care proxy and Living will, and the patient was able to identify a health care proxy as mother .  Patient does not have a living will and power of attorney of health care   Patient Active Problem List   Diagnosis Date Noted   H/O motion sickness 07/28/2023   Closed fracture of base of fifth metatarsal bone of right foot 10/02/2022   Rheumatoid arthritis of right hand (HCC) 09/17/2022   Low serum vitamin B12 09/17/2022   Insulin  resistance 09/17/2022   Intertrigo 04/09/2022   Bacterial vaginosis 04/09/2022   Effusion of left knee 04/09/2022   Migraine without aura and without status migrainosus, not intractable 04/09/2022   Inflammatory arthritis 02/25/2022   Noncompliance with medication regimen 01/16/2021   Moderate recurrent major depression (HCC) 04/11/2020   GAD (generalized anxiety disorder) 04/11/2020   Bereavement 04/11/2020   At risk for prolonged QT interval syndrome 04/11/2020   High risk medication use 04/11/2020   Chronic constipation 08/12/2017   Hyperglycemia 05/04/2015   Tattoos 05/04/2015   Headache, migraine 04/29/2015   Restless leg 04/29/2015   Snores 04/29/2015   Vitamin D   deficiency 04/29/2015    Past Surgical History:  Procedure Laterality Date   ABDOMINAL HYSTERECTOMY     BREAST BIOPSY Left 03/21/2022   us  biopsy/ ribbon clip/  BENIGN MAMMARY PARENCHYMA WITH DENSE STROMAL FIBROSIS AND PSEUDOANGIOMATOUS STROMAL HYPERPLASIA, FIBROCYSTIC AND APOCRINE CHANGES, COLUMNAR CELL CHANGE WITHOUT ATYPIA, AND PATCHY USUAL DUCTAL HYPERPLASIA -   ORIF TOE FRACTURE Right 10/10/2022   Procedure: OPEN REDUCTION INTERNAL FIXATION (ORIF) METATARSAL (TOE) FRACTURE - FIFTH;  Surgeon: Pink Bridges, DPM;  Location: [facility];  Service: Podiatry;  Laterality: Right;   TUBAL LIGATION      Family History  Problem Relation Age of Onset   Heart disease Mother    Asthma Mother    Hypothyroidism Mother    Bipolar disorder Mother    Hypothyroidism Sister    Diabetes Sister    Hyperlipidemia Sister    Bipolar disorder Maternal Aunt    Bipolar disorder Maternal Uncle  Breast cancer Maternal Grandmother    Heart disease Paternal Grandmother        Pacemaker   Leukemia Paternal Grandmother    Diabetes Paternal Grandmother    Asthma Son     Social History   Socioeconomic History   Marital status: Single    Spouse name: Not on file   Number of children: 3   Years of education: Not on file   Highest education level: GED or equivalent  Occupational History   Occupation: Energy manager   Tobacco Use   Smoking status: Former    Current packs/day: 0.00    Average packs/day: 0.3 packs/day for 11.4 years (2.9 ttl pk-yrs)    Types: Cigarettes    Start date: 05/04/2011    Quit date: 10/24/2022    Years since quitting: 1.4   Smokeless tobacco: Never   Tobacco comments:    Done  Vaping Use   Vaping status: Never Used  Substance and Sexual Activity   Alcohol use: Yes    Comment: One glass of wine twice a week   Drug use: No   Sexual activity: Yes    Partners: Male    Birth control/protection: Condom  Other Topics Concern   Not on file  Social  History Narrative   Lives with same person / boyfriend for the past 18 years   She has three son. 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    Youngest sone died at age 61 from Leukemia    Social Drivers of Health   Financial Resource Strain: Low Risk  (03/24/2024)   Overall Financial Resource Strain (CARDIA)    Difficulty of Paying Living Expenses: Not hard at all  Food Insecurity: No Food Insecurity (03/24/2024)   Hunger Vital Sign    Worried About Running Out of Food in the Last Year: Never true    Ran Out of Food in the Last Year: Never true  Transportation Needs: No Transportation Needs (03/24/2024)   PRAPARE - Administrator, Civil Service (Medical): No    Lack of Transportation (Non-Medical): No  Physical Activity: Sufficiently Active (03/24/2024)   Exercise Vital Sign    Days of Exercise per Week: 5 days    Minutes of Exercise per Session: 30 min  Stress: Stress Concern Present (03/24/2024)   [employer] of Occupational Health - Occupational Stress Questionnaire    Feeling of Stress : Rather much  Social Connections: Unknown (03/29/2024)   Social Connection and Isolation Panel [NHANES]    Frequency of Communication with Friends and Family: Three times a week    Frequency of Social Gatherings with Friends and Family: Three times a week    Attends Religious Services: Patient declined    Active Member of Clubs or Organizations: No    Attends Banker Meetings: Never    Marital Status: Living with partner  Intimate Partner Violence: Not At Risk (03/29/2024)   Humiliation, Afraid, Rape, and Kick questionnaire    Fear of Current or Ex-Partner: No    Emotionally Abused: No    Physically Abused: No    Sexually Abused: No     Current Outpatient Medications:    Cyanocobalamin (B-12) 1000 MCG SUBL, Place 1 tablet under the tongue 3 (three) times a week., Disp: 100 tablet, Rfl: 1   escitalopram  (LEXAPRO )  10 MG tablet, TAKE 1 TABLET(10 MG) BY MOUTH DAILY, Disp: 30 tablet, Rfl: 1  folic acid (FOLVITE) 1 MG tablet, Take 1 mg by mouth daily., Disp: , Rfl:    hydroxychloroquine (PLAQUENIL) 200 MG tablet, Take 200 mg by mouth 2 (two) times daily., Disp: , Rfl:    meloxicam  (MOBIC ) 15 MG tablet, TAKE 1 TABLET(15 MG) BY MOUTH DAILY, Disp: 30 tablet, Rfl: 0   methotrexate (RHEUMATREX) 2.5 MG tablet, Take 12.5 mg by mouth once a week. Caution:Chemotherapy. Protect from light., Disp: , Rfl:    Rimegepant Sulfate (NURTEC) 75 MG TBDP, Take 1 tablet by mouth daily as needed. Max of 2 in 24 hours, Disp: 16 tablet, Rfl: 2   topiramate  (TOPAMAX ) 50 MG tablet, TAKE 1 TABLET(50 MG) BY MOUTH TWICE DAILY, Disp: 180 tablet, Rfl: 1   Vitamin D , Ergocalciferol , (DRISDOL ) 1.25 MG (50000 UNIT) CAPS capsule, TAKE 1 CAPSULE BY MOUTH EVERY 7 DAYS, Disp: 12 capsule, Rfl: 0   ZEPBOUND 2.5 MG/0.5ML Pen, Inject 2.5 mg into the skin once a week., Disp: , Rfl:    metFORMIN  (GLUCOPHAGE -XR) 500 MG 24 hr tablet, TAKE 1 TABLET(500 MG) BY MOUTH DAILY WITH BREAKFAST, Disp: 90 tablet, Rfl: 1   scopolamine  (TRANSDERM-SCOP) 1 MG/3DAYS, Place 1 patch (1.5 mg total) onto the skin every 3 (three) days. For motion sickness. Start 2 hr before onset symptoms, up to 12 hr before, Disp: 10 patch, Rfl: 1  Allergies  Allergen Reactions   Corylus Anaphylaxis    (Hazelnut)   Hydrocodone Anaphylaxis   Pumpkin Seed Anaphylaxis    Pumpkin     ROS  Constitutional: Negative for fever positive  weight change.  Respiratory: Negative for cough and shortness of breath.   Cardiovascular: Negative for chest pain or palpitations.  Gastrointestinal: Negative for abdominal pain, no bowel changes.  Musculoskeletal: Negative for gait problem or joint swelling.  Skin: Negative for rash.  Neurological: Negative for dizziness , increase in migraine  headache frequency .  No other specific complaints in a complete review of systems (except as listed in HPI  above).   Objective  Vitals:   03/29/24 0851  BP: 112/74  Pulse: 89  Resp: 16  SpO2: 99%  Weight: 152 lb 4.8 oz (69.1 kg)  Height: 5\' 3"  (1.6 m)    Body mass index is 26.98 kg/m.  Physical Exam  Constitutional: Patient appears well-developed and well-nourished. No distress.  HENT: Head: Normocephalic and atraumatic. Ears: B TMs ok, no erythema or effusion; Nose: Nose normal. Mouth/Throat: Oropharynx is clear and moist. No oropharyngeal exudate.  Eyes: Conjunctivae and EOM are normal. Pupils are equal, round, and reactive to light. No scleral icterus.  Neck: Normal range of motion. Neck supple. No JVD present. No thyromegaly present.  Cardiovascular: Normal rate, regular rhythm and normal heart sounds.  No murmur heard. No BLE edema. Pulmonary/Chest: Effort normal and breath sounds normal. No respiratory distress. Abdominal: Soft. Bowel sounds are normal, no distension. There is no tenderness. no masses Breast: no lumps or masses, no nipple discharge or rashes FEMALE GENITALIA:  Not done  RECTAL: not done  Musculoskeletal: Normal range of motion, no joint effusions. No gross deformities Neurological: he is alert and oriented to person, place, and time. No cranial nerve deficit. Coordination, balance, strength, speech and gait are normal.  Skin: Skin is warm and dry. No rash noted. No erythema.  Psychiatric: Patient has a normal mood and affect. behavior is normal. Judgment and thought content normal.     Assessment & Plan   Assessment & Plan Migraine Migraine headaches occurring at least three times  a week, possibly stress-related. Current treatment with Topamax  is ineffective. - Discontinue Topamax . - Start Nurtec every other day to break the migraine cycle. - Schedule follow-up in 3 months to assess migraine control.  Rheumatoid Arthritis Rheumatoid arthritis affecting multiple joints, currently well-controlled with hydroxychloroquine and methotrexate. Reports some  knee pain but no significant swelling or stiffness. - Continue hydroxychloroquine 200 mg twice daily. - Continue methotrexate as prescribed. - Ensure regular follow-up with rheumatologist Dr. Lydia Sams. - Schedule annual eye exam due to hydroxychloroquine use.  Depression major Recurrent  Depression with a PHQ-9 score of 7, indicating mild depression. Increased symptoms related to grief and stress. Lexapro  was previously effective. - Restart Lexapro  as previously prescribed. - Schedule follow-up in 3 months to assess depression, can be virtual.  History of obesity  Obesity with significant weight loss achieved through lifestyle changes and Zepbound. BMI reduced from 31.24 to 26.98. - Continue Zepbound 5 mg as prescribed. - Maintain current diet and exercise regimen. - Monitor weight and BMI regularly. - Ensure adequate hydration and nutrient intake.  Hyperlipidemia Hyperlipidemia managed with lifestyle modifications. Previous LDL was 150 mg/dL, reduced to 956 mg/dL with dietary changes and weight loss. - Continue lifestyle modifications including diet and exercise. - Monitor cholesterol levels regularly.  General Health Maintenance/CPE General health maintenance is up to date with scheduled mammogram and vaccinations. - Schedule eye exam. - Schedule dental exam. - Ensure mammogram is completed as scheduled.  Follow-up Follow-up plans discussed for various conditions including depression and migraines. She prefers a 58-month follow-up with the option to call sooner if symptoms do not improve. - Schedule follow-up in 6 months with option to call sooner if needed. - Ensure all lab work is completed through [employer].       -USPSTF grade A and B recommendations reviewed with patient; age-appropriate recommendations, preventive care, screening tests, etc discussed and encouraged; healthy living encouraged; see AVS for patient education given to patient -Discussed importance of 150 minutes  of physical activity weekly, eat two servings of fish weekly, eat one serving of tree nuts ( cashews, pistachios, pecans, almonds.Aaron Aas) every other day, eat 6 servings of fruit/vegetables daily and drink plenty of water and avoid sweet beverages.   -Reviewed Health Maintenance: Yes.

## 2024-03-29 NOTE — Telephone Encounter (Signed)
 Pharmacy Patient Advocate Encounter  Received notification from [facility] that Prior Authorization for Nurtec 75MG  dispersible tablets has been APPROVED from 03/29/24 to 09/28/24. Ran test claim, Copay is $0.00. This test claim was processed through [facility]- copay amounts may vary at other pharmacies due to pharmacy/plan contracts, or as the patient moves through the different stages of their insurance plan.   PA #/Case ID/Reference #: [id number].

## 2024-03-30 ENCOUNTER — Ambulatory Visit: Payer: Self-pay | Admitting: Family Medicine

## 2024-03-30 LAB — COMPREHENSIVE METABOLIC PANEL WITH GFR
AG Ratio: 1.8 (calc) (ref 1.0–2.5)
ALT: 9 U/L (ref 6–29)
AST: 11 U/L (ref 10–30)
Albumin: 4.5 g/dL (ref 3.6–5.1)
Alkaline phosphatase (APISO): 39 U/L (ref 31–125)
BUN: 7 mg/dL (ref 7–25)
CO2: 27 mmol/L (ref 20–32)
Calcium: 9.4 mg/dL (ref 8.6–10.2)
Chloride: 106 mmol/L (ref 98–110)
Creat: 0.68 mg/dL (ref 0.50–0.99)
Globulin: 2.5 g/dL (ref 1.9–3.7)
Glucose, Bld: 82 mg/dL (ref 65–99)
Potassium: 4.5 mmol/L (ref 3.5–5.3)
Sodium: 141 mmol/L (ref 135–146)
Total Bilirubin: 0.5 mg/dL (ref 0.2–1.2)
Total Protein: 7 g/dL (ref 6.1–8.1)
eGFR: 111 mL/min/{1.73_m2} (ref >=60)

## 2024-03-30 LAB — CBC WITH DIFFERENTIAL/PLATELET
Absolute Lymphocytes: 1540 {cells}/uL (ref 850–3900)
Absolute Monocytes: 357 {cells}/uL (ref 200–950)
Basophils Absolute: 52 {cells}/uL (ref 0–200)
Basophils Relative: 0.6 %
Eosinophils Absolute: 70 {cells}/uL (ref 15–500)
Eosinophils Relative: 0.8 %
HCT: 43 % (ref 35.0–45.0)
Hemoglobin: 14 g/dL (ref 11.7–15.5)
MCH: 33 pg (ref 27.0–33.0)
MCHC: 32.6 g/dL (ref 32.0–36.0)
MCV: 101.4 fL — ABNORMAL HIGH (ref 80.0–100.0)
MPV: 10.5 fL (ref 7.5–12.5)
Monocytes Relative: 4.1 %
Neutro Abs: 6682 {cells}/uL (ref 1500–7800)
Neutrophils Relative %: 76.8 %
Platelets: 229 Thousand/uL (ref 140–400)
RBC: 4.24 Million/uL (ref 3.80–5.10)
RDW: 12.2 % (ref 11.0–15.0)
Total Lymphocyte: 17.7 %
WBC: 8.7 Thousand/uL (ref 3.8–10.8)

## 2024-03-30 LAB — LIPID PANEL
Cholesterol: 220 mg/dL — ABNORMAL HIGH
HDL: 71 mg/dL
LDL Cholesterol (Calc): 131 mg/dL — ABNORMAL HIGH
Non-HDL Cholesterol (Calc): 149 mg/dL — ABNORMAL HIGH
Total CHOL/HDL Ratio: 3.1 (calc)
Triglycerides: 85 mg/dL

## 2024-03-30 LAB — B12 AND FOLATE PANEL
Folate: 20.5 ng/mL
Vitamin B-12: 730 pg/mL (ref 200–1100)

## 2024-03-30 LAB — HEMOGLOBIN A1C
Hgb A1c MFr Bld: 5.1 %
Mean Plasma Glucose: 100 mg/dL
eAG (mmol/L): 5.5 mmol/L

## 2024-03-30 LAB — VITAMIN D 25 HYDROXY (VIT D DEFICIENCY, FRACTURES): Vit D, 25-Hydroxy: 95 ng/mL (ref 30–100)

## 2024-04-07 ENCOUNTER — Ambulatory Visit: Admitting: Family Medicine

## 2024-04-15 ENCOUNTER — Ambulatory Visit
Admission: RE | Admit: 2024-04-15 | Discharge: 2024-04-15 | Disposition: A | Discharge status: Home / Self Care | Source: Ambulatory Visit | Attending: Family Medicine | Admitting: Family Medicine

## 2024-04-15 DIAGNOSIS — Z1231 Encounter for screening mammogram for malignant neoplasm of breast: Secondary | ICD-10-CM | POA: Diagnosis present

## 2024-04-21 ENCOUNTER — Ambulatory Visit: Payer: Self-pay | Admitting: Family Medicine

## 2024-05-18 ENCOUNTER — Other Ambulatory Visit: Payer: Self-pay | Admitting: Family Medicine

## 2024-05-18 DIAGNOSIS — E559 Vitamin D deficiency, unspecified: Secondary | ICD-10-CM

## 2024-07-22 ENCOUNTER — Other Ambulatory Visit (HOSPITAL_COMMUNITY): Payer: Self-pay

## 2024-07-22 ENCOUNTER — Telehealth: Payer: Self-pay

## 2024-07-22 NOTE — Telephone Encounter (Signed)
 Pharmacy Patient Advocate Encounter   Received notification from Onbase that prior authorization for Nurtec 75MG  dispersible tablets  is required/requested.   Insurance verification completed.   The patient is insured through [facility].   Per test claim: PA required; PA submitted to above mentioned insurance via Latent Key/confirmation #/EOC [id number] Status is pending

## 2024-07-22 NOTE — Telephone Encounter (Signed)
 Pharmacy Patient Advocate Encounter  Received notification from [facility] that Prior Authorization for Nurtec 75MG  dispersible tablets  has been APPROVED from 07/22/24 to 07/22/26. Ran test claim, Copay is $0. This test claim was processed through [facility]- copay amounts may vary at other pharmacies due to pharmacy/plan contracts, or as the patient moves through the different stages of their insurance plan.   PA #/Case ID/Reference #: [id number]  *spoke with [employer] to reprocess

## 2024-08-30 ENCOUNTER — Other Ambulatory Visit (HOSPITAL_COMMUNITY): Payer: Self-pay

## 2024-09-02 ENCOUNTER — Other Ambulatory Visit (HOSPITAL_COMMUNITY): Payer: Self-pay

## 2024-10-01 ENCOUNTER — Ambulatory Visit: Admitting: Family Medicine

## 2024-10-01 ENCOUNTER — Other Ambulatory Visit: Payer: Self-pay | Admitting: Family Medicine

## 2024-10-01 DIAGNOSIS — F331 Major depressive disorder, recurrent, moderate: Secondary | ICD-10-CM

## 2024-10-04 NOTE — Telephone Encounter (Signed)
 Requested Prescriptions  Pending Prescriptions Disp Refills   escitalopram  (LEXAPRO ) 10 MG tablet [Pharmacy Med Name: ESCITALOPRAM  10MG  TABLETS] 90 tablet 0    Sig: TAKE 1 TABLET(10 MG) BY MOUTH DAILY     Psychiatry:  Antidepressants - SSRI Failed - 10/04/2024  3:28 PM      Failed - Valid encounter within last 6 months    Recent Outpatient Visits           6 months ago Well adult exam   [facility] [place], Dorette, MD              Passed - Completed PHQ-2 or PHQ-9 in the last 360 days

## 2024-10-29 ENCOUNTER — Ambulatory Visit: Admitting: Family Medicine

## 2024-11-09 ENCOUNTER — Encounter: Payer: Self-pay | Admitting: Family Medicine

## 2024-11-09 ENCOUNTER — Ambulatory Visit: Admitting: Family Medicine

## 2024-11-09 VITALS — BP 124/74 | HR 83 | Resp 16 | Ht 63.0 in | Wt 139.7 lb

## 2024-11-09 DIAGNOSIS — Z23 Encounter for immunization: Secondary | ICD-10-CM

## 2024-11-09 DIAGNOSIS — M069 Rheumatoid arthritis, unspecified: Secondary | ICD-10-CM | POA: Diagnosis not present

## 2024-11-09 DIAGNOSIS — F331 Major depressive disorder, recurrent, moderate: Secondary | ICD-10-CM | POA: Diagnosis not present

## 2024-11-09 DIAGNOSIS — G43009 Migraine without aura, not intractable, without status migrainosus: Secondary | ICD-10-CM | POA: Diagnosis not present

## 2024-11-09 DIAGNOSIS — F5105 Insomnia due to other mental disorder: Secondary | ICD-10-CM | POA: Diagnosis not present

## 2024-11-09 DIAGNOSIS — Z9225 Personal history of immunosupression therapy: Secondary | ICD-10-CM

## 2024-11-09 MED ORDER — RIZATRIPTAN BENZOATE 10 MG PO TBDP: 10.0000 mg | ORAL_TABLET | ORAL | 0 refills | Status: AC | PRN

## 2024-11-09 MED ORDER — NURTEC 75 MG PO TBDP: 1.0000 | ORAL_TABLET | ORAL | 5 refills | Status: AC

## 2024-11-09 MED ORDER — ESCITALOPRAM OXALATE 10 MG PO TABS: 10.0000 mg | ORAL_TABLET | Freq: Every day | ORAL | 0 refills | Status: AC

## 2024-11-09 MED ORDER — TRAZODONE HCL 50 MG PO TABS: 25.0000 mg | ORAL_TABLET | Freq: Every evening | ORAL | 1 refills | Status: AC | PRN

## 2024-11-09 NOTE — Progress Notes (Signed)
 Name: Virginia Walls   MRN: [id number]    DOB: 1981/02/14   Date:11/09/2024       Progress Note  Subjective  Chief Complaint  Chief Complaint  Patient presents with   Medical Management of Chronic Issues   Discussed the use of AI scribe software for clinical note transcription with the patient, who gave verbal consent to proceed.  History of Present Illness Virginia Walls is a 44 year old female with rheumatoid arthritis who presents for a routine follow-up visit.  She is currently taking hydroxychloroquine 200 mg twice a day and methotrexate 12.5 mg once a week for rheumatoid arthritis. She reports occasional knee pain but no current inflammation or swelling. She has not experienced any side effects from her medications. She is due for a follow-up appointment with her rheumatologist next week, where further labs will be conducted.  She experiences symptoms of depression and anxiety, including feeling overwhelmed, wanting to stay in bed, and difficulty focusing at work. She has a history of seeing a psychiatrist and found therapy helpful in the past. She wants to resume psychiatric care due to current stressors, including concerns about her middle son in college and the anniversary of her other son's death.  She reports sleep disturbances, waking up at 2 AM and having difficulty returning to sleep. She does not currently take any medication for sleep.  She has been taking Zepbound for weight management since 2024, initially starting at a weight of 180 lbs and reaching her target weight of 130 lbs. Her highest dose was 7.5 mg, but she is currently on a reduced dose of 5 mg.  She experiences migraines approximately twice a week, which she attributes to stress and lack of sleep. She takes Nurtec every other day for prevention and uses blackout curtains to manage symptoms during episodes.    Patient Active Problem List   Diagnosis Date Noted   H/O motion sickness 07/28/2023   Closed fracture  of base of fifth metatarsal bone of right foot 10/02/2022   Rheumatoid arthritis of right hand (HCC) 09/17/2022   Low serum vitamin B12 09/17/2022   Insulin  resistance 09/17/2022   Intertrigo 04/09/2022   Bacterial vaginosis 04/09/2022   Effusion of left knee 04/09/2022   Migraine without aura and without status migrainosus, not intractable 04/09/2022   Inflammatory arthritis 02/25/2022   Noncompliance with medication regimen 01/16/2021   Moderate recurrent major depression (HCC) 04/11/2020   GAD (generalized anxiety disorder) 04/11/2020   Bereavement 04/11/2020   At risk for prolonged QT interval syndrome 04/11/2020   High risk medication use 04/11/2020   Chronic constipation 08/12/2017   Hyperglycemia 05/04/2015   Tattoos 05/04/2015   Headache, migraine 04/29/2015   Restless leg 04/29/2015   Snores 04/29/2015   Vitamin D  deficiency 04/29/2015    Past Surgical History:  Procedure Laterality Date   ABDOMINAL HYSTERECTOMY     BREAST BIOPSY Left 03/21/2022   us  biopsy/ ribbon clip/  BENIGN MAMMARY PARENCHYMA WITH DENSE STROMAL FIBROSIS AND PSEUDOANGIOMATOUS STROMAL HYPERPLASIA, FIBROCYSTIC AND APOCRINE CHANGES, COLUMNAR CELL CHANGE WITHOUT ATYPIA, AND PATCHY USUAL DUCTAL HYPERPLASIA -   ORIF TOE FRACTURE Right 10/10/2022   Procedure: OPEN REDUCTION INTERNAL FIXATION (ORIF) METATARSAL (TOE) FRACTURE - FIFTH;  Surgeon: Lennie Barter, DPM;  Location: [facility];  Service: Podiatry;  Laterality: Right;   TUBAL LIGATION      Family History  Problem Relation Age of Onset   Heart disease Mother    Asthma Mother    Hypothyroidism Mother  Bipolar disorder Mother    Hypothyroidism Sister    Diabetes Sister    Hyperlipidemia Sister    Bipolar disorder Maternal Aunt    Bipolar disorder Maternal Uncle    Breast cancer Maternal Grandmother    Heart disease Paternal Grandmother        Pacemaker   Leukemia Paternal Grandmother    Diabetes Paternal Grandmother    Asthma Son      Social History   Tobacco Use   Smoking status: Former    Current packs/day: 0.00    Average packs/day: 0.3 packs/day for 11.4 years (2.9 ttl pk-yrs)    Types: Cigarettes    Start date: 05/04/2011    Quit date: 10/24/2022    Years since quitting: 2.0   Smokeless tobacco: Never   Tobacco comments:    Done  Substance Use Topics   Alcohol use: Yes    Comment: One glass of wine twice a week    Current Medications[1]  Allergies[2]  I personally reviewed active problem list, medication list, allergies, family history with the patient/caregiver today.   ROS  Ten systems reviewed and is negative except as mentioned in HPI    Objective Physical Exam MEASUREMENTS: BMI- 34.75. CONSTITUTIONAL: Patient appears well-developed and well-nourished.  No distress. HEENT: Head atraumatic, normocephalic, neck supple. CARDIOVASCULAR: Normal rate, regular rhythm and normal heart sounds.  No murmur heard. No BLE edema. PULMONARY: Effort normal and breath sounds normal. No respiratory distress. ABDOMINAL: There is no tenderness or distention. MUSCULOSKELETAL: Normal gait. Without gross motor or sensory deficit. PSYCHIATRIC: Patient has a normal mood and affect. behavior is normal. Judgment and thought content normal.  Vitals:   11/09/24 1537  BP: 124/74  Pulse: 83  Resp: 16  SpO2: 98%  Weight: 139 lb 11.2 oz (63.4 kg)  Height: 5' 3 (1.6 m)    Body mass index is 24.75 kg/m.    PHQ2/9:    11/09/2024    3:21 PM 03/29/2024    8:49 AM 09/24/2023    8:00 AM 08/28/2023    1:37 PM 07/28/2023    2:19 PM  Depression screen PHQ 2/9  Decreased Interest 0 1 0 0 0  Down, Depressed, Hopeless 0 1 0 0 0  PHQ - 2 Score 0 2 0 0 0  Altered sleeping 0 1  0 0  Tired, decreased energy 0 1  0 0  Change in appetite 0 1  0 0  Feeling bad or failure about yourself  0 1  0 0  Trouble concentrating 0 1  0 0  Moving slowly or fidgety/restless 0 0  0 0  Suicidal thoughts 0 0  0 0  PHQ-9 Score 0 7   0   0   Difficult doing work/chores Not difficult at all Very difficult  Not difficult at all Not difficult at all     Data saved with a previous flowsheet row definition    phq 9 is negative  Fall Risk:    11/09/2024    3:20 PM 03/29/2024    8:49 AM 09/24/2023    8:00 AM 08/28/2023    1:37 PM 07/28/2023    2:18 PM  Fall Risk   Falls in the past year? 0 0 0 0 0  Number falls in past yr: 0 0 0 0 0  Injury with Fall? 0 0  0  0  0   Risk for fall due to : No Fall Risks No Fall Risks No Fall Risks No Fall  Risks No Fall Risks  Follow up Falls evaluation completed Falls prevention discussed;Education provided;Falls evaluation completed Falls prevention discussed Falls prevention discussed;Education provided;Falls evaluation completed Falls prevention discussed;Education provided;Falls evaluation completed     Data saved with a previous flowsheet row definition    Assessment & Plan Rheumatoid arthritis Well-managed with hydroxychloroquine and methotrexate. Occasional knee pain likely due to osteoarthritis. No medication side effects. Slightly elevated LDL at 131 mg/dL, not requiring treatment. Inflammatory arthritis increases heart disease risk. - Continue hydroxychloroquine 200 mg twice daily. - Continue methotrexate 12.5 mg weekly. - Monitor LDL levels; consider treatment if LDL exceeds 150 mg/dL. - Ensure up-to-date vaccinations due to immunosuppressive therapy.  Major depressive disorder, recurrent, moderate Moderate depression with exacerbation due to stressors. Previous therapy was beneficial. - Referred to psychiatrist for evaluation and management. - Consider resuming therapy with previous therapist, Dr. 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 No current sleep medication use. Anxiety about medication use due to work concerns. - Prescribed trazodone , starting with half a pill an hour before bed, increasing to a full pill if needed. - Advised  on sleep hygiene: avoid TV in bed, ensure room is pitch black, use white noise machine, avoid checking time at night. - Recommended meditation techniques to aid sleep.  Migraine without aura Migraines occur approximately twice a week, possibly related to stress and sleep disturbances. Currently using Nurtec for prevention. - Prescribed Maxalt  for acute migraine episodes as needed. - Continue Nurtec for migraine prevention.        [1]  Current Outpatient Medications:    Cholecalciferol (VITAMIN D ) 50 MCG (2000 UT) CAPS, Take 1 capsule by mouth daily., Disp: , Rfl:    Cyanocobalamin (B-12) 1000 MCG SUBL, Place 1 tablet under the tongue 3 (three) times a week., Disp: 100 tablet, Rfl: 1   escitalopram  (LEXAPRO ) 10 MG tablet, TAKE 1 TABLET(10 MG) BY MOUTH DAILY, Disp: 90 tablet, Rfl: 0   folic acid (FOLVITE) 1 MG tablet, Take 1 mg by mouth daily., Disp: , Rfl:    hydroxychloroquine (PLAQUENIL) 200 MG tablet, Take 200 mg by mouth 2 (two) times daily., Disp: , Rfl:    methotrexate (RHEUMATREX) 2.5 MG tablet, Take 12.5 mg by mouth once a week. Caution:Chemotherapy. Protect from light., Disp: , Rfl:    Rimegepant Sulfate (NURTEC) 75 MG TBDP, Take 1 tablet (75 mg total) by mouth every other day. Max of 2 in 24 hours, Disp: 16 tablet, Rfl: 5   tirzepatide (ZEPBOUND) 5 MG/0.5ML Pen, Inject 5 mg into the skin once a week., Disp: , Rfl:  [2]  Allergies Allergen Reactions   Corylus Anaphylaxis    ([place])   Hydrocodone Anaphylaxis   Pumpkin Seed Anaphylaxis    Pumpkin

## 2025-05-27 ENCOUNTER — Ambulatory Visit: Admitting: Family Medicine
